# Patient Record
Sex: Female | Born: 1985 | Race: White | Hispanic: No | Marital: Married | Smoking: Never smoker
Health system: Southern US, Community
[De-identification: ages and names within clinical notes are randomized; demographics above are authoritative.]

## PROBLEM LIST (undated history)

## (undated) ENCOUNTER — Inpatient Hospital Stay: Payer: Self-pay

## (undated) DIAGNOSIS — A6009 Herpesviral infection of other urogenital tract: Secondary | ICD-10-CM

## (undated) DIAGNOSIS — E559 Vitamin D deficiency, unspecified: Secondary | ICD-10-CM

## (undated) DIAGNOSIS — O4440 Low lying placenta NOS or without hemorrhage, unspecified trimester: Secondary | ICD-10-CM

## (undated) DIAGNOSIS — F419 Anxiety disorder, unspecified: Secondary | ICD-10-CM

## (undated) DIAGNOSIS — K219 Gastro-esophageal reflux disease without esophagitis: Secondary | ICD-10-CM

## (undated) DIAGNOSIS — U071 COVID-19: Secondary | ICD-10-CM

## (undated) DIAGNOSIS — Z87442 Personal history of urinary calculi: Secondary | ICD-10-CM

## (undated) HISTORY — PX: TONSILLECTOMY: SUR1361

## (undated) HISTORY — DX: COVID-19: U07.1

## (undated) HISTORY — DX: Low lying placenta nos or without hemorrhage, unspecified trimester: O44.40

## (undated) HISTORY — DX: Anxiety disorder, unspecified: F41.9

## (undated) HISTORY — PX: OTHER SURGICAL HISTORY: SHX169

## (undated) HISTORY — PX: DIAGNOSTIC LAPAROSCOPY: SUR761

## (undated) HISTORY — DX: Herpesviral infection of other urogenital tract: A60.09

## (undated) HISTORY — DX: Vitamin D deficiency, unspecified: E55.9

## (undated) HISTORY — PX: ABDOMINAL HYSTERECTOMY: SHX81

---
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
## 2008-09-18 ENCOUNTER — Emergency Department: Payer: Self-pay | Admitting: Emergency Medicine

## 2009-09-06 ENCOUNTER — Ambulatory Visit: Payer: Self-pay | Admitting: Family Medicine

## 2009-09-09 ENCOUNTER — Ambulatory Visit: Payer: Self-pay | Admitting: Family Medicine

## 2009-11-03 ENCOUNTER — Ambulatory Visit: Payer: Self-pay | Admitting: General Practice

## 2009-11-04 ENCOUNTER — Emergency Department: Payer: Self-pay | Admitting: Emergency Medicine

## 2009-11-07 ENCOUNTER — Ambulatory Visit: Payer: Self-pay | Admitting: Urology

## 2009-12-30 ENCOUNTER — Ambulatory Visit: Payer: Self-pay | Admitting: Urology

## 2010-01-09 ENCOUNTER — Ambulatory Visit: Payer: Self-pay | Admitting: Obstetrics and Gynecology

## 2010-01-13 ENCOUNTER — Ambulatory Visit: Payer: Self-pay | Admitting: Obstetrics and Gynecology

## 2010-03-14 ENCOUNTER — Ambulatory Visit: Payer: Self-pay | Admitting: Physician Assistant

## 2010-05-07 ENCOUNTER — Emergency Department: Payer: Self-pay | Admitting: Emergency Medicine

## 2010-06-14 ENCOUNTER — Other Ambulatory Visit: Payer: Self-pay | Admitting: Physician Assistant

## 2010-08-26 ENCOUNTER — Ambulatory Visit: Payer: Self-pay | Admitting: Family Medicine

## 2011-06-01 IMAGING — CR DG KNEE COMPLETE 4+V*R*
1 series · 4 of 4 positions shown · non-contrast
Comparison: none

REASON FOR EXAM: injury pain crepitus fax result to [phone]  Knee pain
COMMENTS:

[Series 1: view not recorded · 0.17mm/px · 4 of 4 slices shown]
[im 1/4]
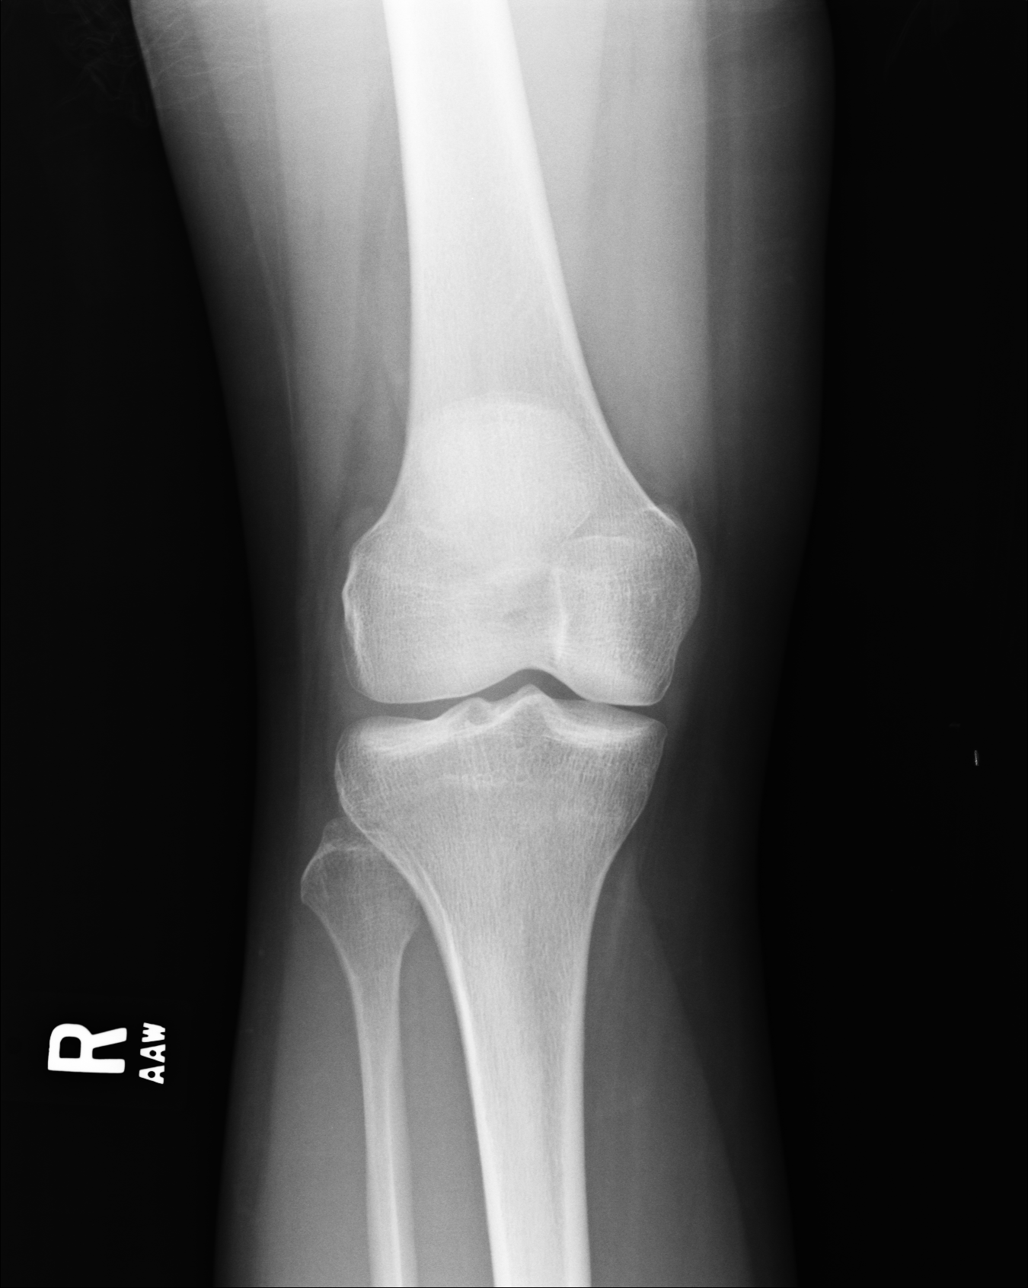
[im 2/4]
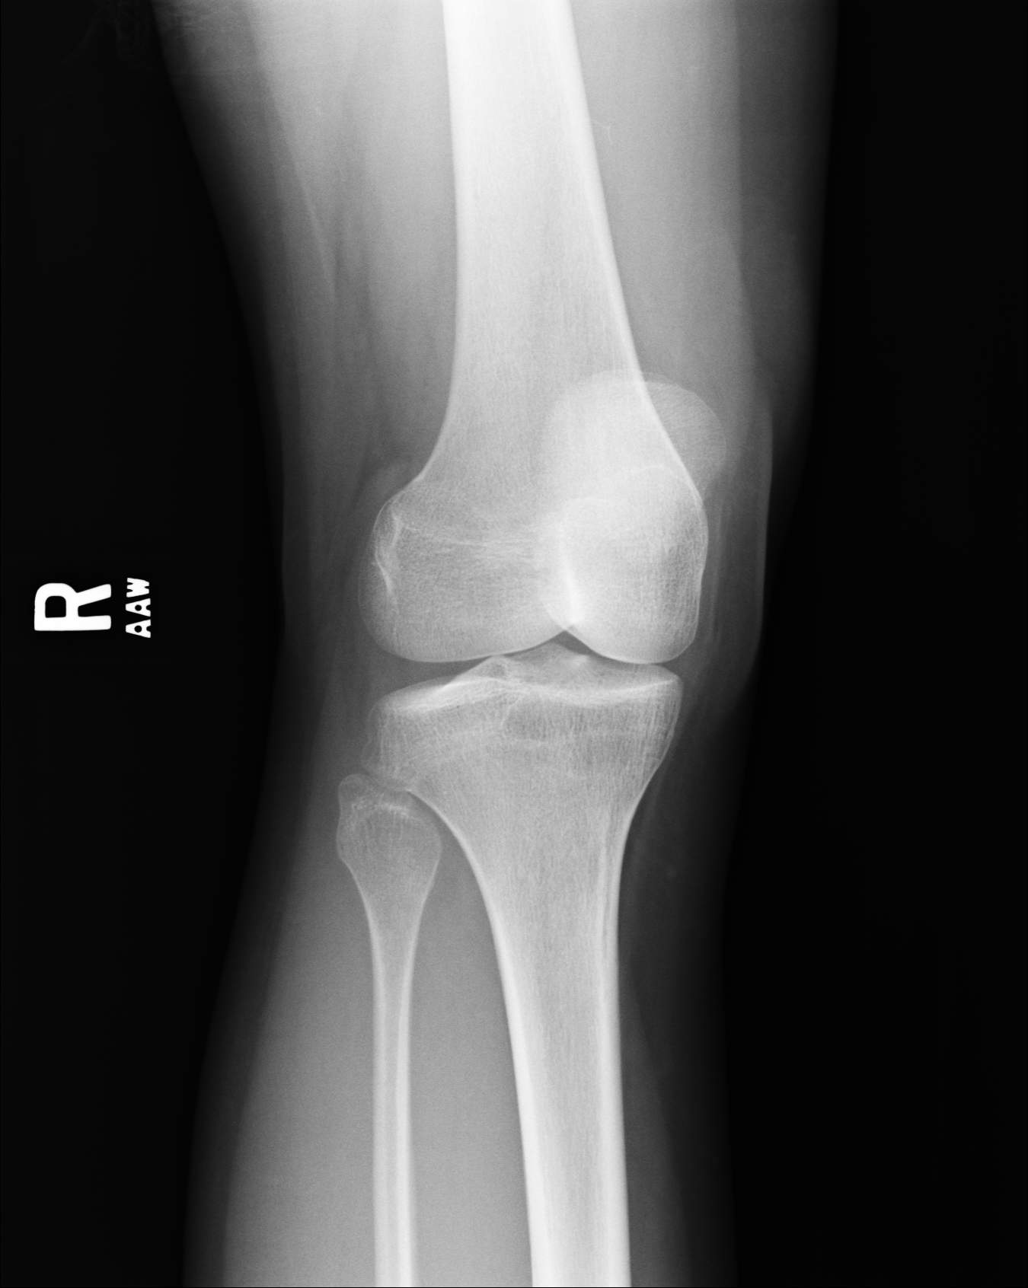
[im 3/4]
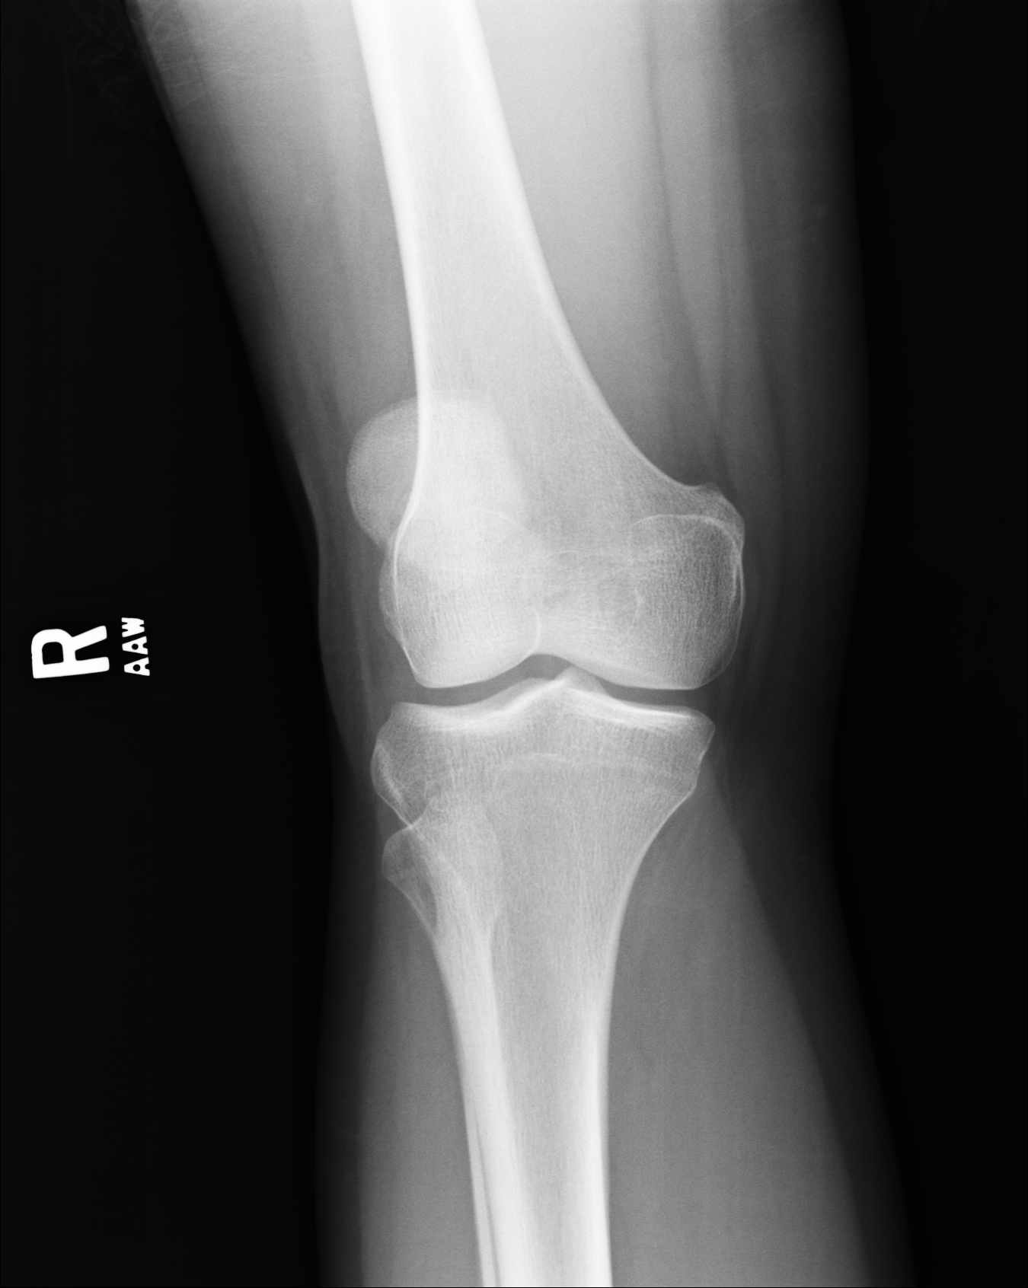
[im 4/4]
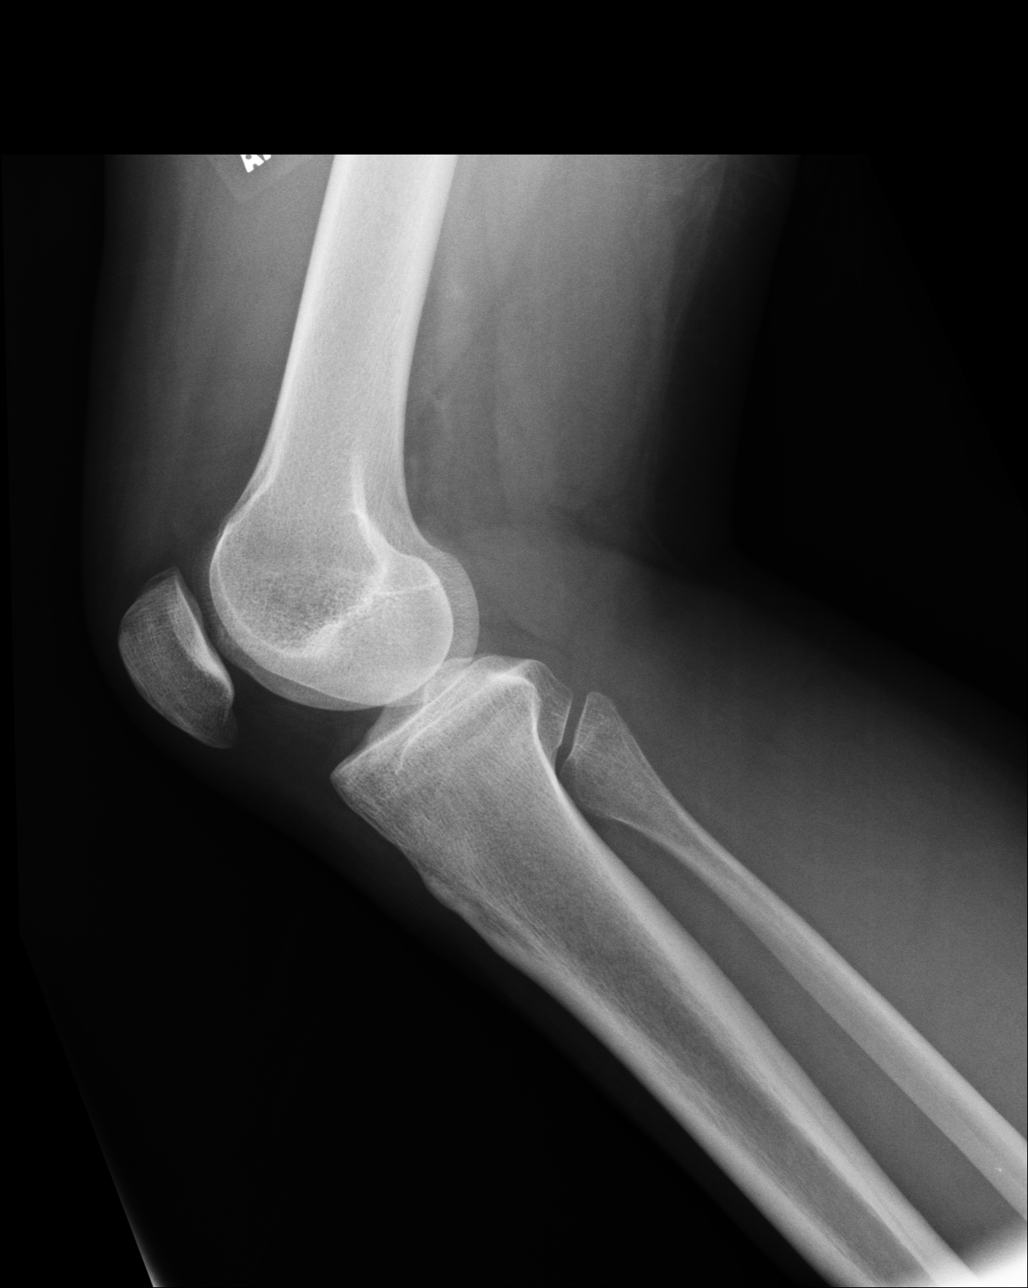

[4 of 4 positions shown; findings below may reference images not displayed]

PROCEDURE:     DXR - DXR KNEE RT COMP WITH OBLIQUES  - March 14, 2010 [DATE]

RESULT:     Four views of the right knee are submitted. The bones appear
adequately mineralized. I do not see evidence of an acute fracture nor
dislocation. No significant degenerative change is seen. I see no definite
evidence of a joint effusion.
IMPRESSION: I see no acute bony abnormality of the right knee.

## 2012-03-12 ENCOUNTER — Ambulatory Visit: Payer: Self-pay | Admitting: Otolaryngology

## 2012-08-28 ENCOUNTER — Ambulatory Visit: Payer: Self-pay | Admitting: General Practice

## 2012-12-30 ENCOUNTER — Other Ambulatory Visit: Payer: Self-pay | Admitting: Obstetrics and Gynecology

## 2013-04-18 ENCOUNTER — Observation Stay: Payer: Self-pay

## 2013-04-18 LAB — URINALYSIS, COMPLETE
Glucose,UR: NEGATIVE mg/dL (ref 0–75)
Ketone: NEGATIVE
Nitrite: NEGATIVE
Ph: 7 (ref 4.5–8.0)
Protein: NEGATIVE
RBC,UR: 1 /HPF (ref 0–5)
Specific Gravity: 1.012 (ref 1.003–1.030)
WBC UR: 22 /HPF (ref 0–5)

## 2013-04-20 LAB — URINE CULTURE

## 2013-04-23 ENCOUNTER — Observation Stay: Payer: Self-pay | Admitting: Obstetrics and Gynecology

## 2013-04-25 ENCOUNTER — Observation Stay: Payer: Self-pay | Admitting: Obstetrics and Gynecology

## 2013-04-25 LAB — URINALYSIS, COMPLETE
Ketone: NEGATIVE
Nitrite: NEGATIVE
Ph: 8 (ref 4.5–8.0)
Squamous Epithelial: 4

## 2013-04-26 LAB — URINE CULTURE

## 2013-05-01 ENCOUNTER — Observation Stay: Payer: Self-pay | Admitting: Obstetrics and Gynecology

## 2013-05-06 ENCOUNTER — Inpatient Hospital Stay: Payer: Self-pay | Admitting: Obstetrics and Gynecology

## 2013-05-06 LAB — CBC WITH DIFFERENTIAL/PLATELET
Basophil #: 0.1 10*3/uL (ref 0.0–0.1)
Basophil %: 0.4 %
Eosinophil #: 0.2 10*3/uL (ref 0.0–0.7)
HCT: 31.5 % — ABNORMAL LOW (ref 35.0–47.0)
HGB: 10.6 g/dL — ABNORMAL LOW (ref 12.0–16.0)
Lymphocyte #: 3.8 10*3/uL — ABNORMAL HIGH (ref 1.0–3.6)
Lymphocyte %: 27.3 %
MCV: 84 fL (ref 80–100)
Monocyte #: 0.7 x10 3/mm (ref 0.2–0.9)
Monocyte %: 4.7 %
Neutrophil #: 9.2 10*3/uL — ABNORMAL HIGH (ref 1.4–6.5)
Neutrophil %: 66.1 %
Platelet: 346 10*3/uL (ref 150–440)
RDW: 14.5 % (ref 11.5–14.5)
WBC: 13.9 10*3/uL — ABNORMAL HIGH (ref 3.6–11.0)

## 2013-05-08 LAB — HEMATOCRIT: HCT: 25.6 % — ABNORMAL LOW (ref 35.0–47.0)

## 2013-05-09 LAB — HEMATOCRIT: HCT: 34.7 % — ABNORMAL LOW (ref 35.0–47.0)

## 2014-02-03 ENCOUNTER — Emergency Department: Payer: Self-pay | Admitting: Emergency Medicine

## 2014-06-04 NOTE — L&D Delivery Note (Signed)
Delivery Note At  a viable and healthy female was delivered via  (Presentation:OA;  ).  APGAR:8 ,9 ; weight  .   Placenta status:delivered intact schultz , .  3 vessel Cord: with one loop over posterior shoulder  with the following complications:none .  Cord pH: NA  Anesthesia: Epidural  Episiotomy:  none Lacerations:  none Suture Repair: NA Est. Blood Loss (mL):  200  Mom to postpartum.  Baby to Couplet care / Skin to Skin. Delivered with supervision by Kadeisha Betsch Mohammed Kindle Elder, RN-C, SNM  Khandi Kernes Trudee Kuster 02/08/2015, 9:17 PM

## 2014-07-06 LAB — OB RESULTS CONSOLE PLATELET COUNT: PLATELETS: 364 10*3/uL

## 2014-07-06 LAB — OB RESULTS CONSOLE RUBELLA ANTIBODY, IGM: Rubella: IMMUNE

## 2014-07-06 LAB — OB RESULTS CONSOLE GC/CHLAMYDIA
CHLAMYDIA, DNA PROBE: NEGATIVE
Gonorrhea: NEGATIVE

## 2014-07-06 LAB — OB RESULTS CONSOLE ANTIBODY SCREEN: ANTIBODY SCREEN: NEGATIVE

## 2014-07-06 LAB — OB RESULTS CONSOLE VARICELLA ZOSTER ANTIBODY, IGG: Varicella: IMMUNE

## 2014-07-06 LAB — OB RESULTS CONSOLE HIV ANTIBODY (ROUTINE TESTING): HIV: NONREACTIVE

## 2014-07-06 LAB — OB RESULTS CONSOLE HGB/HCT, BLOOD
HCT: 39 %
Hemoglobin: 13.4 g/dL

## 2014-07-06 LAB — OB RESULTS CONSOLE HEPATITIS B SURFACE ANTIGEN: HEP B S AG: NEGATIVE

## 2014-07-06 LAB — OB RESULTS CONSOLE ABO/RH: RH Type: POSITIVE

## 2014-07-06 LAB — OB RESULTS CONSOLE RPR: RPR: NONREACTIVE

## 2014-10-12 NOTE — H&P (Signed)
L&D Evaluation:  History:  HPI 16 yowf G1P0 at 33.5 weeks , estimated date of confinement 06/09/13, presents for A/P testing due to 3rd trimester bleeding and abnormal U/S demonstrating placental lakes with one lake suspicious for active blood flow. Rt flank/abdominal pain persists. no UTI symptoms; recently stopped Macrobid for prophylaxis for recurrent UTI in pregnancy. No vaginal discharge. Some light spotting.   Patient's Medical History Recurrent UTI; BV   Patient's Surgical History Laparoscopy   Medications Pre Natal Vitamins  Iron   Allergies NKDA   Social History none   Family History Non-Contributory   ROS:  ROS See HPI   Exam:  Vital Signs stable   Urine Protein negative dipstick, 3+ leucocyte esterase; NO Blood   General no apparent distress, anxious   Abdomen gravid, non-tender, No CVAT   Estimated Fetal Weight Average for gestational age   Back no CVAT   Edema no edema   Pelvic no external lesions, 50%$/closed/No blood/VTX/BOWI   FHT normal rate with no decels, NST reactive   Ucx absent, Irritability   Skin dry, no lesions, Petechial rash on abdomen   Lymph no lymphadenopathy   Impression:  Impression reactive NST, 33.5 week Intrauterine pregnancy; Abnormal Placenta on U/S (placental lakes); Suspected UTI   Plan:  Plan Continue Q 3 Day NST; PML/Abruptio precautions;  BID Fetal Kick Counts;  Discontinue strict bed rest, continue sexual abstinence, no shopping/extended ambulation/prolonged standingl; Urine culture   Comments Pt is S/P betamethasone x 2.   Follow Up Appointment already scheduled   Electronic Signatures: Lue Sykora, Alanda Slim (MD)  (Signed [phone] 10:46)  Authored: L&D Evaluation   Last Updated: 22-Nov-14 10:46 by Almedia Cordell, Alanda Slim (MD)

## 2014-10-12 NOTE — H&P (Signed)
L&D Evaluation:  History:  HPI 29yo MWF presented at 5am with SPROM at 230am of clear fluid with spotting.[redacted]w[redacted]d EGA [id number]; pregancy complicated by anemia and uterine bleeding secondary to placental lakes.   Presents with contractions, leaking fluid   Patient's Medical History No Chronic Illness   Patient's Surgical History other  Exploratory lap for endometriosis   Medications Pre Natal Vitamins  Iron  Tylenol (Acetaminophen)  benedryl, prilosec, vit D   Allergies NKDA   Social History none   Family History Non-Contributory   ROS:  ROS All systems were reviewed.  HEENT, CNS, GI, GU, Respiratory, CV, Renal and Musculoskeletal systems were found to be normal.   Exam:  Vital Signs stable   Urine Protein negative dipstick   General no apparent distress   Mental Status clear   Chest clear   Heart normal sinus rhythm   Abdomen gravid, tender with contractions   Estimated Fetal Weight Average for gestational age   Fetal Position vtx   Back no CVAT   Edema 1+   Clonus negative   Pelvic 1/80/-1   Mebranes Ruptured   Description clear, scant blood show   FHT normal rate with no decels   Fetal Heart Rate 135   Ucx regular   Ucx Frequency 2 min   Length of each Contraction 60 seconds   Ucx Pain Scale 4   Impression:  Impression early labor, SPROM   Plan:  Plan EFM/NST, monitor contractions and for cervical change, antibiotics for GBBS prophylaxis, fluids, Foley bulb placed and pitocin augmentation   Electronic Signatures: Trudee Kuster, Melody N (CNM)  (Signed 03-Dec-14 08:53)  Authored: L&D Evaluation   Last Updated: 03-Dec-14 08:53 by Evonnie Pat (CNM)

## 2014-11-22 ENCOUNTER — Encounter: Payer: Self-pay | Admitting: *Deleted

## 2014-11-25 ENCOUNTER — Ambulatory Visit (INDEPENDENT_AMBULATORY_CARE_PROVIDER_SITE_OTHER): Payer: 59 | Admitting: Obstetrics and Gynecology

## 2014-11-25 ENCOUNTER — Encounter: Payer: Self-pay | Admitting: Obstetrics and Gynecology

## 2014-11-25 VITALS — BP 103/72 | HR 96 | Wt 169.8 lb

## 2014-11-25 DIAGNOSIS — O9989 Other specified diseases and conditions complicating pregnancy, childbirth and the puerperium: Secondary | ICD-10-CM

## 2014-11-25 DIAGNOSIS — R300 Dysuria: Secondary | ICD-10-CM

## 2014-11-25 DIAGNOSIS — N390 Urinary tract infection, site not specified: Secondary | ICD-10-CM

## 2014-11-25 DIAGNOSIS — M549 Dorsalgia, unspecified: Secondary | ICD-10-CM

## 2014-11-25 DIAGNOSIS — Z3493 Encounter for supervision of normal pregnancy, unspecified, third trimester: Secondary | ICD-10-CM

## 2014-11-25 LAB — POCT URINALYSIS DIPSTICK
Blood, UA: NEGATIVE
GLUCOSE UA: NEGATIVE
Ketones, UA: NEGATIVE
Leukocytes, UA: NEGATIVE
NITRITE UA: NEGATIVE
PROTEIN UA: NEGATIVE
UROBILINOGEN UA: 0.2
pH, UA: 6.5

## 2014-11-25 NOTE — Progress Notes (Signed)
ROB- reports onset mid-back pain x 1 week worse after being up on feet and active- recommend maternity belt and massage; reports irregular spotting with increased activity (not new)- reiterated need to let me know if it is heavy or persistant;  Glucola next visit

## 2014-11-25 NOTE — Progress Notes (Signed)
Pt is c/o bad back pain x1 week

## 2014-11-25 NOTE — Patient Instructions (Signed)
  Place 24-38 weeks prenatal visit patient instructions here.

## 2014-11-26 LAB — URINE CULTURE

## 2014-11-30 ENCOUNTER — Observation Stay
Admission: EM | Admit: 2014-11-30 | Discharge: 2014-11-30 | Disposition: A | Discharge status: Home / Self Care | Payer: 59 | Attending: Obstetrics and Gynecology | Admitting: Obstetrics and Gynecology

## 2014-11-30 DIAGNOSIS — O26892 Other specified pregnancy related conditions, second trimester: Secondary | ICD-10-CM | POA: Diagnosis not present

## 2014-11-30 DIAGNOSIS — R110 Nausea: Secondary | ICD-10-CM | POA: Diagnosis present

## 2014-11-30 DIAGNOSIS — R109 Unspecified abdominal pain: Secondary | ICD-10-CM | POA: Insufficient documentation

## 2014-11-30 DIAGNOSIS — N9489 Other specified conditions associated with female genital organs and menstrual cycle: Secondary | ICD-10-CM | POA: Diagnosis present

## 2014-11-30 DIAGNOSIS — Z3A27 27 weeks gestation of pregnancy: Secondary | ICD-10-CM | POA: Insufficient documentation

## 2014-11-30 LAB — URINALYSIS COMPLETE WITH MICROSCOPIC (ARMC ONLY)
Bilirubin Urine: NEGATIVE
GLUCOSE, UA: NEGATIVE mg/dL
Hgb urine dipstick: NEGATIVE
Ketones, ur: NEGATIVE mg/dL
NITRITE: NEGATIVE
Protein, ur: NEGATIVE mg/dL
Specific Gravity, Urine: 1.005 (ref 1.005–1.030)
pH: 7 (ref 5.0–8.0)

## 2014-11-30 MED ORDER — ACETAMINOPHEN 325 MG PO TABS: 650.0000 mg | ORAL_TABLET | ORAL | Status: DC | PRN

## 2014-11-30 MED ORDER — LACTATED RINGERS IV SOLN
INTRAVENOUS | Status: DC
  Administered 2014-11-30 (×2): via INTRAVENOUS

## 2014-11-30 MED ORDER — FENTANYL CITRATE (PF) 100 MCG/2ML IJ SOLN
50.0000 ug | Freq: Once | INTRAMUSCULAR | Status: AC
  Administered 2014-11-30: 50 ug via INTRAVENOUS

## 2014-11-30 MED ORDER — FENTANYL CITRATE (PF) 100 MCG/2ML IJ SOLN
INTRAMUSCULAR | Status: AC
  Administered 2014-11-30: 50 ug via INTRAVENOUS
  Filled 2014-11-30: qty 2

## 2014-11-30 MED ORDER — ONDANSETRON HCL 4 MG/2ML IJ SOLN
4.0000 mg | Freq: Four times a day (QID) | INTRAMUSCULAR | Status: DC | PRN
  Administered 2014-11-30: 4 mg via INTRAVENOUS

## 2014-11-30 NOTE — Progress Notes (Signed)
Rebecca Evans is a 29 y.o. G2P1001 at [redacted]w[redacted]d who is admitted for cramping and nausea x 2days with severe cramping since 10am today..  Estimated Date of Delivery: 02/26/15 Fetal presentation is unsure.  Length of Stay:   Days. Admitted 11/30/2014  Subjective: Onset of nausea x 2 days without vomiting, anorexia and not eating much since onset.  Patient reports good fetal movement.  She reports irregular crampy like  uterine contractions, no bleeding and no loss of fluid per vagina.  Vitals:  Blood pressure 113/74, pulse 109, temperature 98.6 F (37 C), temperature source Oral, resp. rate 20, height 5\' 6"  (1.676 m), weight 76.658 kg (169 lb), last menstrual period 05/22/2014. Physical Examination: CONSTITUTIONAL: Well-developed, well-nourished female in no acute distress.  SKIN: Skin is warm and dry. No rash noted. Not diaphoretic. No erythema. No pallor. [place]: Alert and oriented to person, place, and time. Normal reflexes, muscle tone coordination. No cranial nerve deficit noted. PSYCHIATRIC: Normal mood and affect. Normal behavior. Normal judgment and thought content. CARDIOVASCULAR: Normal heart rate noted, regular rhythm RESPIRATORY: Effort and breath sounds normal, no problems with respiration noted MUSCULOSKELETAL: Normal range of motion. No edema and no tenderness. 2+ distal pulses. ABDOMEN: Soft, nontender, nondistended, gravid. CERVIX: Dilation: Closed Effacement (%): Thick Cervical Position: Middle Station: Ballotable Presentation: Undeterminable Exam by:: k. yates, rn  Fetal monitoring: FHR: 156 bpm, Variability: moderate, Accelerations: Present, Decelerations: Absent  Uterine activity: 8 contractions per hour, lasting 20-30 seconds mild to palpation on admission- resolved with IVF hydration  Results for orders placed or performed during the hospital encounter of 11/30/14 (from the past 48 hour(s))  Urinalysis complete, with microscopic ([facility] only)     Status:  Abnormal   Collection Time: 11/30/14  1:02 PM  Result Value Ref Range   Color, Urine YELLOW (A) YELLOW   APPearance CLEAR (A) CLEAR   Glucose, UA NEGATIVE NEGATIVE mg/dL   Bilirubin Urine NEGATIVE NEGATIVE   Ketones, ur NEGATIVE NEGATIVE mg/dL   Specific Gravity, Urine 1.005 1.005 - 1.030   Hgb urine dipstick NEGATIVE NEGATIVE   pH 7.0 5.0 - 8.0   Protein, ur NEGATIVE NEGATIVE mg/dL   Nitrite NEGATIVE NEGATIVE   Leukocytes, UA 2+ (A) NEGATIVE   RBC / HPF 0-5 0 - 5 RBC/hpf   WBC, UA 0-5 0 - 5 WBC/hpf   Bacteria, UA RARE (A) NONE SEEN   Squamous Epithelial / LPF 0-5 (A) NONE SEEN    No results found.  Current scheduled medications    I have reviewed the patient's current medications.  ASSESSMENT: Patient Active Problem List   Diagnosis Date Noted  . Labor and delivery, indication for care 11/30/2014  Uterine irritability at [redacted]w[redacted]d Nausea   PLAN: Continue IV hydration until 2 liters infused, trial PO liduids and crackers D/c home when stable rx for zofran 4mg  ODT called in to pharmacy Continue routine antenatal   Rebecca Evans, CNM [facility] [place]

## 2014-11-30 NOTE — OB Triage Note (Signed)
Pt c/o lower abdominal pain, "dribbling urine", cramping and headache which all started this AM. Denies bleeding. Reports nausea for past 2 days, no vomiting.

## 2014-11-30 NOTE — Progress Notes (Signed)
Pt states after her d/c instructions reviewed that she was to receive a work note excuse for today and tomorrow from [employer]. CNM contacted and stated that was correct but that she had forgotten too- requested that RN complete a note for pt. Done and provided to pt.

## 2014-11-30 NOTE — Progress Notes (Signed)
Report received from off going shift that pt ready for d/c now that second litre fluid infused. Pt states that she is feeling better and ready too.Marland Kitchen Spouse and daughter at bedside. Spouse attentive.

## 2014-12-07 ENCOUNTER — Ambulatory Visit (INDEPENDENT_AMBULATORY_CARE_PROVIDER_SITE_OTHER): Payer: 59 | Admitting: Obstetrics and Gynecology

## 2014-12-07 ENCOUNTER — Encounter: Payer: Self-pay | Admitting: Obstetrics and Gynecology

## 2014-12-07 VITALS — BP 112/70 | HR 97 | Wt 171.0 lb

## 2014-12-07 DIAGNOSIS — Z3493 Encounter for supervision of normal pregnancy, unspecified, third trimester: Secondary | ICD-10-CM

## 2014-12-07 DIAGNOSIS — Z131 Encounter for screening for diabetes mellitus: Secondary | ICD-10-CM

## 2014-12-07 LAB — POCT URINALYSIS DIPSTICK
GLUCOSE UA: NEGATIVE
Ketones, UA: 15
Nitrite, UA: NEGATIVE
RBC UA: NEGATIVE
Spec Grav, UA: 1.01
UROBILINOGEN UA: 0.2
pH, UA: 6.5

## 2014-12-07 NOTE — Progress Notes (Signed)
Pt denies any changes, surprised at her weight gain

## 2014-12-07 NOTE — Patient Instructions (Signed)
  Place 24-38 weeks prenatal visit patient instructions here.

## 2014-12-07 NOTE — Progress Notes (Signed)
ROB & glucola- feeling better since hospital visit last week, Tdap given and Blood consent signed;

## 2014-12-08 ENCOUNTER — Telehealth: Payer: Self-pay | Admitting: *Deleted

## 2014-12-08 LAB — URINE CULTURE

## 2014-12-08 LAB — GLUCOSE, 1 HOUR GESTATIONAL: GESTATIONAL DIABETES SCREEN: 121 mg/dL (ref 65–139)

## 2014-12-08 LAB — HEMOGLOBIN AND HEMATOCRIT, BLOOD
HEMATOCRIT: 35.1 % (ref 34.0–46.6)
HEMOGLOBIN: 11.9 g/dL (ref 11.1–15.9)

## 2014-12-08 NOTE — Telephone Encounter (Signed)
Notified pt of results, she voiced understanding.

## 2014-12-08 NOTE — Telephone Encounter (Signed)
-----   Message from Evonnie Pat, [place] sent at 12/08/2014  8:12 AM EDT ----- Please let her kow she passed her glucola and no signs of anemia

## 2014-12-09 ENCOUNTER — Encounter: Payer: 59 | Admitting: Obstetrics and Gynecology

## 2014-12-21 ENCOUNTER — Ambulatory Visit (INDEPENDENT_AMBULATORY_CARE_PROVIDER_SITE_OTHER): Payer: 59 | Admitting: Obstetrics and Gynecology

## 2014-12-21 ENCOUNTER — Encounter: Payer: Self-pay | Admitting: Obstetrics and Gynecology

## 2014-12-21 VITALS — BP 117/79 | HR 104 | Wt 172.7 lb

## 2014-12-21 DIAGNOSIS — Z3493 Encounter for supervision of normal pregnancy, unspecified, third trimester: Secondary | ICD-10-CM

## 2014-12-21 LAB — POCT URINALYSIS DIPSTICK
BILIRUBIN UA: NEGATIVE
GLUCOSE UA: NEGATIVE
Ketones, UA: NEGATIVE
NITRITE UA: NEGATIVE
RBC UA: NEGATIVE
Spec Grav, UA: 1.015
UROBILINOGEN UA: 0.2
pH, UA: 6.5

## 2014-12-21 NOTE — Progress Notes (Signed)
Pt is doing well, no complaints, is feeling good today

## 2014-12-21 NOTE — Progress Notes (Signed)
ROB- doing well, denies any spotting or UTI s/s.

## 2014-12-23 LAB — URINE CULTURE: Organism ID, Bacteria: NO GROWTH

## 2014-12-29 ENCOUNTER — Encounter: Payer: Self-pay | Admitting: Obstetrics and Gynecology

## 2014-12-29 ENCOUNTER — Ambulatory Visit (INDEPENDENT_AMBULATORY_CARE_PROVIDER_SITE_OTHER): Payer: 59 | Admitting: Obstetrics and Gynecology

## 2014-12-29 VITALS — BP 108/72 | HR 99 | Wt 172.6 lb

## 2014-12-29 DIAGNOSIS — Z3493 Encounter for supervision of normal pregnancy, unspecified, third trimester: Secondary | ICD-10-CM

## 2014-12-29 LAB — POCT URINALYSIS DIPSTICK
Bilirubin, UA: NEGATIVE
Blood, UA: NEGATIVE
GLUCOSE UA: NEGATIVE
Ketones, UA: 5
NITRITE UA: NEGATIVE
PH UA: 6.5
Protein, UA: NEGATIVE
SPEC GRAV UA: 1.01
Urobilinogen, UA: 0.2

## 2014-12-29 NOTE — Progress Notes (Signed)
Pt is here for OB work in, c/o pressure in vaginal area, she states "she just doesn't feel good", is concerned b/c her urine color is generally a dark yellow and today its pale yellow

## 2014-12-30 LAB — URINE CULTURE: ORGANISM ID, BACTERIA: NO GROWTH

## 2015-01-04 ENCOUNTER — Encounter: Payer: Self-pay | Admitting: Obstetrics and Gynecology

## 2015-01-04 ENCOUNTER — Other Ambulatory Visit: Payer: Self-pay | Admitting: Obstetrics and Gynecology

## 2015-01-04 ENCOUNTER — Ambulatory Visit (INDEPENDENT_AMBULATORY_CARE_PROVIDER_SITE_OTHER): Payer: 59 | Admitting: Obstetrics and Gynecology

## 2015-01-04 ENCOUNTER — Other Ambulatory Visit: Payer: Self-pay

## 2015-01-04 VITALS — BP 116/77 | HR 103 | Wt 174.1 lb

## 2015-01-04 DIAGNOSIS — R102 Pelvic and perineal pain: Principal | ICD-10-CM

## 2015-01-04 DIAGNOSIS — O26893 Other specified pregnancy related conditions, third trimester: Secondary | ICD-10-CM

## 2015-01-04 DIAGNOSIS — Z3493 Encounter for supervision of normal pregnancy, unspecified, third trimester: Secondary | ICD-10-CM

## 2015-01-04 LAB — POCT URINALYSIS DIPSTICK
Blood, UA: NEGATIVE
Glucose, UA: NEGATIVE
Ketones, UA: NEGATIVE
Nitrite, UA: NEGATIVE
Spec Grav, UA: 1.01
Urobilinogen, UA: 0.2
pH, UA: 6.5

## 2015-01-04 NOTE — Progress Notes (Signed)
ROB- reassured patient of no cervical changes; FFN obtained; reiterated PTL s/s; maternity belt made pressure worse. Microscopic wet-mount exam shows negative for pathogens, normal epithelial cells.

## 2015-01-04 NOTE — Progress Notes (Signed)
Pt is still having some cramps, still feels as if something isnt right, lots of vaginal pressure

## 2015-01-05 ENCOUNTER — Encounter: Payer: Self-pay | Admitting: Obstetrics and Gynecology

## 2015-01-05 ENCOUNTER — Telehealth: Payer: Self-pay | Admitting: *Deleted

## 2015-01-05 LAB — FETAL FIBRONECTIN: Fetal Fibronectin: NEGATIVE

## 2015-01-05 NOTE — Telephone Encounter (Signed)
Notified pt of normal results 

## 2015-01-05 NOTE — Telephone Encounter (Signed)
-----   Message from Evonnie Pat, [place] sent at 01/05/2015 10:07 AM EDT ----- Please let her know FFN is negative

## 2015-01-18 ENCOUNTER — Ambulatory Visit (INDEPENDENT_AMBULATORY_CARE_PROVIDER_SITE_OTHER): Payer: 59 | Admitting: Obstetrics and Gynecology

## 2015-01-18 VITALS — BP 114/69 | HR 97 | Wt 175.0 lb

## 2015-01-18 DIAGNOSIS — Z36 Encounter for antenatal screening of mother: Secondary | ICD-10-CM

## 2015-01-18 DIAGNOSIS — Z331 Pregnant state, incidental: Secondary | ICD-10-CM

## 2015-01-18 DIAGNOSIS — Z1389 Encounter for screening for other disorder: Secondary | ICD-10-CM

## 2015-01-18 DIAGNOSIS — Z369 Encounter for antenatal screening, unspecified: Secondary | ICD-10-CM

## 2015-01-18 DIAGNOSIS — N390 Urinary tract infection, site not specified: Secondary | ICD-10-CM

## 2015-01-18 LAB — POCT URINALYSIS DIPSTICK
Bilirubin, UA: NEGATIVE
Glucose, UA: NEGATIVE
Ketones, UA: NEGATIVE
NITRITE UA: NEGATIVE
PH UA: 6
PROTEIN UA: NEGATIVE
RBC UA: NEGATIVE
Spec Grav, UA: 1.01
UROBILINOGEN UA: NEGATIVE

## 2015-01-18 NOTE — Addendum Note (Signed)
Addended by: Earl Lagos on: 01/18/2015 12:23 PM   Modules accepted: Orders

## 2015-01-18 NOTE — Progress Notes (Signed)
Rob- reports increased [facility] that are painful but not consistent; PTL precautions discussed; vaginal varicosities noted- to use TUCKs pads prn; states didn't get Tdap at 28 weeks visit- and declines at this time.  Cultures next visit. Considering nexplanon PP.

## 2015-01-18 NOTE — Progress Notes (Signed)
Pt states FM has decreased, bloody streaks in vaginal discharge, and cramping vaginally to back then around to front.

## 2015-01-19 ENCOUNTER — Telehealth: Payer: Self-pay | Admitting: *Deleted

## 2015-01-19 LAB — URINE CULTURE

## 2015-01-21 NOTE — Telephone Encounter (Signed)
Notified pt results

## 2015-01-21 NOTE — Telephone Encounter (Signed)
Notified pt. 

## 2015-01-27 ENCOUNTER — Ambulatory Visit (INDEPENDENT_AMBULATORY_CARE_PROVIDER_SITE_OTHER): Payer: 59 | Admitting: Obstetrics and Gynecology

## 2015-01-27 ENCOUNTER — Encounter: Payer: Self-pay | Admitting: Obstetrics and Gynecology

## 2015-01-27 VITALS — BP 113/74 | HR 97 | Wt 176.9 lb

## 2015-01-27 DIAGNOSIS — Z113 Encounter for screening for infections with a predominantly sexual mode of transmission: Secondary | ICD-10-CM

## 2015-01-27 DIAGNOSIS — Z36 Encounter for antenatal screening of mother: Secondary | ICD-10-CM

## 2015-01-27 DIAGNOSIS — Z3685 Encounter for antenatal screening for Streptococcus B: Secondary | ICD-10-CM

## 2015-01-27 DIAGNOSIS — Z331 Pregnant state, incidental: Secondary | ICD-10-CM

## 2015-01-27 LAB — POCT URINALYSIS DIPSTICK
BILIRUBIN UA: NEGATIVE
GLUCOSE UA: NEGATIVE
Ketones, UA: NEGATIVE
Leukocytes, UA: NEGATIVE
Nitrite, UA: NEGATIVE
RBC UA: NEGATIVE
Spec Grav, UA: <=1.005
Urobilinogen, UA: 0.2
pH, UA: 7.5

## 2015-01-27 NOTE — Progress Notes (Signed)
ROB-tingling L side of her hand, contractions, low back pain, pelvic pressure cultrues today

## 2015-01-27 NOTE — Progress Notes (Signed)
ROB- cultures obtained; labor precautions reiterated.

## 2015-01-29 LAB — STREP GP B NAA: Strep Gp B NAA: POSITIVE — AB

## 2015-01-31 LAB — GC/CHLAMYDIA PROBE AMP

## 2015-02-01 ENCOUNTER — Encounter: Payer: Self-pay | Admitting: Obstetrics and Gynecology

## 2015-02-01 ENCOUNTER — Ambulatory Visit (INDEPENDENT_AMBULATORY_CARE_PROVIDER_SITE_OTHER): Payer: 59 | Admitting: Obstetrics and Gynecology

## 2015-02-01 VITALS — BP 119/67 | HR 90 | Wt 175.2 lb

## 2015-02-01 DIAGNOSIS — Z331 Pregnant state, incidental: Secondary | ICD-10-CM

## 2015-02-01 LAB — POCT URINALYSIS DIPSTICK
Bilirubin, UA: NEGATIVE
Blood, UA: NEGATIVE
Glucose, UA: NEGATIVE
Ketones, UA: NEGATIVE
Nitrite, UA: NEGATIVE
PH UA: 6.5
UROBILINOGEN UA: 0.2

## 2015-02-01 NOTE — Progress Notes (Signed)
ROB- reviewed +GBS; labor precautions reiterated

## 2015-02-01 NOTE — Patient Instructions (Signed)
Group B Streptococcus Infection During Pregnancy Group B streptococcus (GBS) is a type of bacteria often found in healthy women. GBS is not the same as the bacteria that causes strep throat. You may have GBS in your vagina, rectum, or bladder. GBS does not spread through sexual contact, but it can be passed to a baby during childbirth. This can be dangerous for your baby. It is not dangerous to you and usually does not cause any symptoms. Your health care provider may test you for GBS when your pregnancy is between 35 and 37 weeks. GBS is dangerous only during birth, so there is no need to test for it earlier. It is possible to have GBS during pregnancy and never pass it to your baby. If your test results are positive for GBS, your health care provider may recommend giving you antibiotic medicine during delivery to make sure your baby stays healthy. RISK FACTORS You are more likely to pass GBS to your baby if:   Your water breaks (ruptured membrane) or you go into labor before 37 weeks.  Your water breaks 18 hours before you deliver.  You passed GBS during a previous pregnancy.  You have a urinary tract infection caused by GBS any time during pregnancy.  You have a fever during labor. SYMPTOMS Most women who have GBS do not have any symptoms. If you have a urinary tract infection caused by GBS, you might have frequent or painful urination and fever. Babies who get GBS usually show symptoms within 7 days of birth. Symptoms may include:   Breathing problems.  Heart and blood pressure problems.  Digestive and kidney problems. DIAGNOSIS Routine screening for GBS is recommended for all pregnant women. A health care provider takes a sample of the fluid in your vagina and rectum with a swab. It is then sent to a lab to be checked for GBS. A sample of your urine may also be checked for the bacteria.  TREATMENT If you test positive for GBS, you may need treatment with an antibiotic medicine during  labor. As soon as you go into labor, or as soon as your membranes rupture, you will get the antibiotic medicine through an IV access. You will continue to get the medicine until after you give birth. You do not need antibiotic medicine if you are having a cesarean delivery.If your baby shows signs or symptoms of GBS after birth, your baby can also be treated with an antibiotic medicine. HOME CARE INSTRUCTIONS   Take all antibiotic medicine as prescribed by your health care provider. Only take medicine as directed.   Continue with prenatal visits and care.   Keep all follow-up appointments.  SEEK MEDICAL CARE IF:   You have pain when you urinate.   You have to urinate frequently.   You have a fever.  SEEK IMMEDIATE MEDICAL CARE IF:   Your membranes rupture.  You go into labor. Document Released: 08/28/2007 Document Revised: 05/26/2013 Document Reviewed: 03/13/2013 ExitCare Patient Information 2015 [employer]. This information is not intended to replace advice given to you by your health care provider. Make sure you discuss any questions you have with your health care provider.  

## 2015-02-01 NOTE — Progress Notes (Signed)
ROB-pt is having some increased vaginal fluid, lots of pelvic pressure

## 2015-02-02 ENCOUNTER — Other Ambulatory Visit: Payer: Self-pay | Admitting: *Deleted

## 2015-02-02 ENCOUNTER — Telehealth: Payer: Self-pay | Admitting: Obstetrics and Gynecology

## 2015-02-02 ENCOUNTER — Telehealth: Payer: Self-pay | Admitting: *Deleted

## 2015-02-02 DIAGNOSIS — Z113 Encounter for screening for infections with a predominantly sexual mode of transmission: Secondary | ICD-10-CM

## 2015-02-02 NOTE — Telephone Encounter (Signed)
Notified pt. 

## 2015-02-02 NOTE — Telephone Encounter (Signed)
If contractions get stronger to go to hospital for labor check, otherwise sounds normal. Continue to drink a lot of water.

## 2015-02-02 NOTE — Telephone Encounter (Signed)
nm

## 2015-02-02 NOTE — Telephone Encounter (Signed)
Pt called stating she has been spotting since her OV 02/01/2015, states its not heavy however has a lot of mucous present Per pt she is having contractions, however not constant, she is having feeling of needing to have a BM and nothing happens She is in pain and wanted to know what to do???

## 2015-02-03 ENCOUNTER — Observation Stay
Admission: EM | Admit: 2015-02-03 | Discharge: 2015-02-04 | Disposition: A | Discharge status: Home / Self Care | Payer: 59 | Attending: Obstetrics and Gynecology | Admitting: Obstetrics and Gynecology

## 2015-02-03 DIAGNOSIS — O26893 Other specified pregnancy related conditions, third trimester: Principal | ICD-10-CM | POA: Insufficient documentation

## 2015-02-04 ENCOUNTER — Other Ambulatory Visit: Payer: Self-pay | Admitting: Obstetrics and Gynecology

## 2015-02-04 DIAGNOSIS — O26893 Other specified pregnancy related conditions, third trimester: Secondary | ICD-10-CM | POA: Diagnosis present

## 2015-02-04 MED ORDER — BUTORPHANOL TARTRATE 1 MG/ML IJ SOLN
INTRAMUSCULAR | Status: AC
  Administered 2015-02-04: 2 mg via INTRAMUSCULAR
  Filled 2015-02-04: qty 2

## 2015-02-04 MED ORDER — BUTORPHANOL TARTRATE 1 MG/ML IJ SOLN
2.0000 mg | Freq: Once | INTRAMUSCULAR | Status: AC
  Administered 2015-02-04: 2 mg via INTRAMUSCULAR

## 2015-02-04 NOTE — Discharge Instructions (Signed)
LABOR: When contractions begin, you should start to time them from the beginning of one contraction to the beginning of the next.  When contractions are 5-10 minutes apart or less and have been regular for at least an hour, you should call your health care provider.  Notify your doctor if any of the following occur: 1. Bleeding from the vagina 7. Sudden, constant, or occasional abdominal pain  2. Pain or burning when urinating 8. Sudden gushing of fluid from the vagina (with or without continued leaking)  3. Chills or fever 9. Fainting spells, "black outs" or loss of consciousness  4. Increase in vaginal discharge 10. Severe or continued nausea or vomiting  5. Pelvic pressure (sudden increase) 11. Blurring of vision or spots before the eyes  6. Baby moving less than usual 12. Leaking of fluid    FETAL KICK COUNT: Lie on your left side for one hour after a meal, and count the number of times your baby kicks. If it is less than 5 times, get up, move around and drink some juice. Repeat the test 30 minutes later. If it is still less than 5 kicks in an hour, notify your doctor.

## 2015-02-07 ENCOUNTER — Inpatient Hospital Stay
Admission: EM | Admit: 2015-02-07 | Discharge: 2015-02-10 | DRG: 774 | Disposition: A | Discharge status: Home / Self Care | Payer: 59 | Attending: Obstetrics and Gynecology | Admitting: Obstetrics and Gynecology

## 2015-02-07 DIAGNOSIS — O479 False labor, unspecified: Secondary | ICD-10-CM | POA: Diagnosis present

## 2015-02-07 DIAGNOSIS — F419 Anxiety disorder, unspecified: Secondary | ICD-10-CM | POA: Diagnosis present

## 2015-02-07 DIAGNOSIS — O99344 Other mental disorders complicating childbirth: Secondary | ICD-10-CM | POA: Diagnosis present

## 2015-02-07 DIAGNOSIS — O4410 Placenta previa with hemorrhage, unspecified trimester: Secondary | ICD-10-CM | POA: Diagnosis present

## 2015-02-07 DIAGNOSIS — Z3A37 37 weeks gestation of pregnancy: Secondary | ICD-10-CM | POA: Diagnosis present

## 2015-02-07 DIAGNOSIS — A6000 Herpesviral infection of urogenital system, unspecified: Secondary | ICD-10-CM | POA: Diagnosis present

## 2015-02-07 DIAGNOSIS — Z3490 Encounter for supervision of normal pregnancy, unspecified, unspecified trimester: Secondary | ICD-10-CM

## 2015-02-07 DIAGNOSIS — O99824 Streptococcus B carrier state complicating childbirth: Secondary | ICD-10-CM | POA: Diagnosis present

## 2015-02-07 DIAGNOSIS — Z833 Family history of diabetes mellitus: Secondary | ICD-10-CM

## 2015-02-07 DIAGNOSIS — O4202 Full-term premature rupture of membranes, onset of labor within 24 hours of rupture: Principal | ICD-10-CM | POA: Diagnosis present

## 2015-02-07 DIAGNOSIS — O9832 Other infections with a predominantly sexual mode of transmission complicating childbirth: Secondary | ICD-10-CM | POA: Diagnosis present

## 2015-02-07 NOTE — OB Triage Note (Signed)
Pt. reports sporadic cx for the last 4 to 5 days, waning today. Reports increasing pressure and LOF.

## 2015-02-08 ENCOUNTER — Other Ambulatory Visit: Payer: Self-pay | Admitting: Obstetrics and Gynecology

## 2015-02-08 ENCOUNTER — Inpatient Hospital Stay: Payer: 59 | Admitting: Registered Nurse

## 2015-02-08 DIAGNOSIS — Z3483 Encounter for supervision of other normal pregnancy, third trimester: Secondary | ICD-10-CM | POA: Diagnosis not present

## 2015-02-08 DIAGNOSIS — O99344 Other mental disorders complicating childbirth: Secondary | ICD-10-CM | POA: Diagnosis present

## 2015-02-08 DIAGNOSIS — Z3490 Encounter for supervision of normal pregnancy, unspecified, unspecified trimester: Secondary | ICD-10-CM

## 2015-02-08 DIAGNOSIS — O479 False labor, unspecified: Secondary | ICD-10-CM | POA: Diagnosis present

## 2015-02-08 DIAGNOSIS — O4410 Placenta previa with hemorrhage, unspecified trimester: Secondary | ICD-10-CM | POA: Diagnosis present

## 2015-02-08 DIAGNOSIS — O4202 Full-term premature rupture of membranes, onset of labor within 24 hours of rupture: Secondary | ICD-10-CM | POA: Diagnosis present

## 2015-02-08 DIAGNOSIS — O9832 Other infections with a predominantly sexual mode of transmission complicating childbirth: Secondary | ICD-10-CM | POA: Diagnosis present

## 2015-02-08 DIAGNOSIS — O99824 Streptococcus B carrier state complicating childbirth: Secondary | ICD-10-CM | POA: Diagnosis present

## 2015-02-08 DIAGNOSIS — Z3A37 37 weeks gestation of pregnancy: Secondary | ICD-10-CM | POA: Diagnosis present

## 2015-02-08 DIAGNOSIS — Z833 Family history of diabetes mellitus: Secondary | ICD-10-CM | POA: Diagnosis not present

## 2015-02-08 DIAGNOSIS — F419 Anxiety disorder, unspecified: Secondary | ICD-10-CM | POA: Diagnosis present

## 2015-02-08 DIAGNOSIS — A6000 Herpesviral infection of urogenital system, unspecified: Secondary | ICD-10-CM | POA: Diagnosis present

## 2015-02-08 LAB — CBC
HEMATOCRIT: 35.5 % (ref 35.0–47.0)
HEMOGLOBIN: 12.2 g/dL (ref 12.0–16.0)
MCH: 30.9 pg (ref 26.0–34.0)
MCHC: 34.3 g/dL (ref 32.0–36.0)
MCV: 90.1 fL (ref 80.0–100.0)
Platelets: 263 10*3/uL (ref 150–440)
RBC: 3.95 MIL/uL (ref 3.80–5.20)
RDW: 13.9 % (ref 11.5–14.5)
WBC: 11.7 10*3/uL — ABNORMAL HIGH (ref 3.6–11.0)

## 2015-02-08 LAB — ROM PLUS (ARMC ONLY): Rom Plus: NEGATIVE

## 2015-02-08 LAB — TYPE AND SCREEN
ABO/RH(D): O POS
Antibody Screen: NEGATIVE

## 2015-02-08 MED ORDER — OXYTOCIN 40 UNITS IN LACTATED RINGERS INFUSION - SIMPLE MED
1.0000 m[IU]/min | INTRAVENOUS | Status: DC
  Administered 2015-02-08: 1 m[IU]/min via INTRAVENOUS

## 2015-02-08 MED ORDER — MISOPROSTOL 200 MCG PO TABS
ORAL_TABLET | ORAL | Status: AC
  Filled 2015-02-08: qty 4

## 2015-02-08 MED ORDER — LIDOCAINE-EPINEPHRINE (PF) 1.5 %-1:200000 IJ SOLN
INTRAMUSCULAR | Status: DC | PRN
  Administered 2015-02-08: 3 mL via PERINEURAL
  Administered 2015-02-08: 3 mL via EPIDURAL

## 2015-02-08 MED ORDER — SODIUM CHLORIDE 0.9 % IJ SOLN
INTRAMUSCULAR | Status: AC
Start: 2015-02-08 — End: 2015-02-09
  Filled 2015-02-08: qty 10

## 2015-02-08 MED ORDER — OXYTOCIN 40 UNITS IN LACTATED RINGERS INFUSION - SIMPLE MED
62.5000 mL/h | INTRAVENOUS | Status: DC
  Filled 2015-02-08: qty 1000

## 2015-02-08 MED ORDER — OXYTOCIN BOLUS FROM INFUSION: 500.0000 mL | INTRAVENOUS | Status: DC

## 2015-02-08 MED ORDER — ALUM & MAG HYDROXIDE-SIMETH 200-200-20 MG/5ML PO SUSP
30.0000 mL | ORAL | Status: DC | PRN
  Administered 2015-02-08: 30 mL via ORAL

## 2015-02-08 MED ORDER — ALUM & MAG HYDROXIDE-SIMETH 200-200-20 MG/5ML PO SUSP
ORAL | Status: AC
  Administered 2015-02-08: 30 mL via ORAL
  Filled 2015-02-08: qty 30

## 2015-02-08 MED ORDER — LACTATED RINGERS IV SOLN
INTRAVENOUS | Status: DC
  Administered 2015-02-08 (×3): via INTRAVENOUS
  Administered 2015-02-08: 500 mL via INTRAVENOUS

## 2015-02-08 MED ORDER — OXYTOCIN 10 UNIT/ML IJ SOLN
INTRAMUSCULAR | Status: AC
  Filled 2015-02-08: qty 2

## 2015-02-08 MED ORDER — TERBUTALINE SULFATE 1 MG/ML IJ SOLN: 0.2500 mg | Freq: Once | INTRAMUSCULAR | Status: DC | PRN

## 2015-02-08 MED ORDER — SODIUM CHLORIDE 0.9 % IV SOLN
1.0000 g | INTRAVENOUS | Status: DC
  Administered 2015-02-08 (×3): 1 g via INTRAVENOUS
  Filled 2015-02-08 (×4): qty 1000

## 2015-02-08 MED ORDER — LIDOCAINE HCL (PF) 1 % IJ SOLN
30.0000 mL | INTRAMUSCULAR | Status: DC | PRN
  Filled 2015-02-08: qty 30

## 2015-02-08 MED ORDER — ONDANSETRON HCL 4 MG/2ML IJ SOLN: 4.0000 mg | Freq: Four times a day (QID) | INTRAMUSCULAR | Status: DC | PRN

## 2015-02-08 MED ORDER — SODIUM CHLORIDE 0.9 % IV SOLN
INTRAVENOUS | Status: AC
  Administered 2015-02-08: 2 g via INTRAVENOUS
  Filled 2015-02-08: qty 2000

## 2015-02-08 MED ORDER — AMMONIA AROMATIC IN INHA
RESPIRATORY_TRACT | Status: AC
  Filled 2015-02-08: qty 10

## 2015-02-08 MED ORDER — FENTANYL 2.5 MCG/ML W/ROPIVACAINE 0.2% IN NS 100 ML EPIDURAL INFUSION (ARMC-ANES)
EPIDURAL | Status: AC
  Administered 2015-02-08: 10 mL/h via EPIDURAL
  Filled 2015-02-08: qty 100

## 2015-02-08 MED ORDER — LIDOCAINE HCL (PF) 1 % IJ SOLN
INTRAMUSCULAR | Status: AC
  Administered 2015-02-08: 3 mL via INTRADERMAL
  Filled 2015-02-08: qty 30

## 2015-02-08 MED ORDER — SODIUM CHLORIDE 0.9 % IV SOLN
2.0000 g | Freq: Once | INTRAVENOUS | Status: AC
  Administered 2015-02-08: 2 g via INTRAVENOUS

## 2015-02-08 MED ORDER — SODIUM CHLORIDE 0.9 % IJ SOLN
INTRAMUSCULAR | Status: AC
Start: 2015-02-08 — End: 2015-02-09
  Filled 2015-02-08: qty 6

## 2015-02-08 MED ORDER — LACTATED RINGERS IV SOLN: 500.0000 mL | INTRAVENOUS | Status: DC | PRN

## 2015-02-08 MED ORDER — BUPIVACAINE HCL (PF) 0.25 % IJ SOLN
INTRAMUSCULAR | Status: DC | PRN
  Administered 2015-02-08 (×2): 4 mL via EPIDURAL
  Administered 2015-02-08: 8 mL

## 2015-02-08 NOTE — Anesthesia Preprocedure Evaluation (Signed)
Anesthesia Evaluation  Patient identified by MRN, date of birth, ID band Patient awake    Reviewed: Allergy & Precautions, H&P , NPO status , Patient's Chart, lab work & pertinent test results  History of Anesthesia Complications Negative for: history of anesthetic complications  Airway Mallampati: II  TM Distance: >3 FB Neck ROM: full    Dental no notable dental hx.    Pulmonary neg pulmonary ROS,    Pulmonary exam normal       Cardiovascular negative cardio ROS Normal cardiovascular exam    Neuro/Psych negative neurological ROS  negative psych ROS   GI/Hepatic negative GI ROS, Neg liver ROS,   Endo/Other  negative endocrine ROS  Renal/GU negative Renal ROS  negative genitourinary   Musculoskeletal   Abdominal   Peds  Hematology negative hematology ROS (+)   Anesthesia Other Findings   Reproductive/Obstetrics (+) Pregnancy                             Anesthesia Physical Anesthesia Plan  ASA: II  Anesthesia Plan: Epidural   Post-op Pain Management:    Induction:   Airway Management Planned:   Additional Equipment:   Intra-op Plan:   Post-operative Plan:   Informed Consent: I have reviewed the patients History and Physical, chart, labs and discussed the procedure including the risks, benefits and alternatives for the proposed anesthesia with the patient or authorized representative who has indicated his/her understanding and acceptance.     Plan Discussed with: Anesthesiologist  Anesthesia Plan Comments:         Anesthesia Quick Evaluation

## 2015-02-08 NOTE — Progress Notes (Signed)
Rebecca Evans is a 29 y.o. G2P1001 at [redacted]w[redacted]d by LMP admitted for active labor, rupture of membranes, PROM  Subjective:   Objective: BP 124/79 mmHg  Pulse 96  Temp(Src) 98 F (36.7 C) (Oral)  Resp 16  Ht 5\' 7"  (1.702 m)  Wt 81.194 kg (179 lb)  BMI 28.03 kg/m2  LMP 05/22/2014 (LMP Unknown)   Total I/O In: 1110.6 [I.V.:1110.6] Out: 3 [Urine:3]  Fetal Wellbeing:  Reactive without decels UC:   regular, every 2-3 minutes on 70mu/min pitocin SVE:   Dilation: 4 Effacement (%): 80 Station: -1 Exam by:: [employer]: Lab Results  Component Value Date   WBC 11.7* 02/08/2015   HGB 12.2 02/08/2015   HCT 35.5 02/08/2015   MCV 90.1 02/08/2015   PLT 263 02/08/2015    Assessment / Plan: Augmentation of labor, progressing well  Labor: Progressing normally Preeclampsia:  NA Pain Control:  Epidural ordered Anticipated MOD:  NSVD  Melody Burr 02/08/2015, 1:38 PM

## 2015-02-08 NOTE — Progress Notes (Signed)
Mee Macdonnell [place] is a 29 y.o. G2P1001 at [redacted]w[redacted]d by LMP admitted for rupture of membranes  Subjective:   Objective: BP 115/67 mmHg  Pulse 93  Temp(Src) 97.6 F (36.4 C) (Oral)  Resp 16  Ht 5\' 7"  (1.702 m)  Wt 81.194 kg (179 lb)  BMI 28.03 kg/m2  SpO2 97%  LMP 05/22/2014 (LMP Unknown) I/O last 3 completed shifts: In: 1211.5 [I.V.:1211.5] Out: 3 [Urine:3]    Fetal Wellbeing:  Category I UC:   regular, every 2 minutes, 19 mu Pitocin with now adequate MVU's SVE:   Dilation: 9 Effacement (%): 90 Station: +1 Exam by:: C. Elder  Labs: Lab Results  Component Value Date   WBC 11.7* 02/08/2015   HGB 12.2 02/08/2015   HCT 35.5 02/08/2015   MCV 90.1 02/08/2015   PLT 263 02/08/2015    Assessment / Plan: Augmentation of labor, progressing well  Labor: Progressing normally Preeclampsia:  n/a Pain Control:  Epidural Anticipated MOD:  NSVD  Seen with [place] Mohammed Kindle Elder, RN-C, SNM Korby Ratay Burr 02/08/2015, 8:04 PM

## 2015-02-08 NOTE — H&P (Signed)
Obstetric History and Physical  Rebecca Evans is a 29 y.o. G2P1001 with IUP at [redacted]w[redacted]d presenting with SROM at 1600 on 02/07/15. Patient states she has been having  irregular, every 4-5 minutes contractions, none vaginal bleeding, intact, ruptured, clear fluid, NTZ + membranes, with active fetal movement.    Prenatal Course Source of Care: [facility]  Pregnancy complications or risks:GBS + and recurrent UTIs  Prenatal labs and studies: ABO, Rh: --/--/O POS (09/06 4174) Antibody: NEG (09/06 0324) Rubella: Immune (02/02 0000) RPR: Nonreactive (02/02 0000)  HBsAg: Negative (02/02 0000)  HIV: Non-reactive (02/02 0000)  GBS:  1 hr Glucola  normal Genetic screening normal Anatomy US normal  Past Medical History  Diagnosis Date  . Low-lying placenta   . Anxiety   . Herpes genitalis in women     Past Surgical History  Procedure Laterality Date  . Laproscopy      OB History  Gravida Para Term Preterm AB SAB TAB Ectopic Multiple Living  2 1 1       1     # Outcome Date GA Lbr Len/2nd Weight Sex Delivery Anes PTL Lv  2 Current           1 Term 05/07/13    F Vag-Spont   Y      Social History   Social History  . Marital Status: Married    Spouse Name: N/A  . Number of Children: N/A  . Years of Education: N/A   Social History Main Topics  . Smoking status: Never Smoker   . Smokeless tobacco: Never Used  . Alcohol Use: No  . Drug Use: No  . Sexual Activity: Yes    Birth Control/ Protection: None   Other Topics Concern  . None   Social History Narrative    Family History  Problem Relation Age of Onset  . Diabetes Maternal Grandmother   . Diabetes Maternal Grandfather     Prescriptions prior to admission  Medication Sig Dispense Refill Last Dose  . cholecalciferol (VITAMIN D) 400 UNITS TABS tablet Take 400 Units by mouth.   Taking  . nitrofurantoin, macrocrystal-monohydrate, (MACROBID) 100 MG capsule Take 100 mg by mouth daily.   Taking  . omeprazole (PRILOSEC) 20  MG capsule Take 20 mg by mouth daily.   Taking  . Prenatal Vit-Fe Fumarate-FA (PRENATAL MULTIVITAMIN) TABS tablet Take 1 tablet by mouth daily at 12 noon.   Taking    No Known Allergies  Review of Systems: Negative except for what is mentioned in HPI.  Physical Exam: BP 123/73 mmHg  Pulse 89  Temp(Src) 98.2 F (36.8 C) (Oral)  Resp 16  Ht 5\' 7"  (1.702 m)  Wt 81.194 kg (179 lb)  BMI 28.03 kg/m2  LMP 05/22/2014 (LMP Unknown) GENERAL: Well-developed, well-nourished female in no acute distress.  LUNGS: Clear to auscultation bilaterally.  HEART: Regular rate and rhythm. ABDOMEN: Soft, nontender, nondistended, gravid. EXTREMITIES: Nontender, no edema, 2+ distal pulses. Cervical Exam: Dilation: 3 Effacement (%): 70 Station: -3 Presentation: Vertex Exam by:: Palma Holter, RN FHT:  Baseline rate 125 bpm   Variability moderate  Accelerations present   Decelerations none Contractions: Every 3-5 mins   Pertinent Labs/Studies:   Results for orders placed or performed during the hospital encounter of 02/07/15 (from the past 24 hour(s))  ROM Plus ([facility] only)     Status: None   Collection Time: 02/08/15  1:04 AM  Result Value Ref Range   Rom Plus NEGATIVE   CBC  Status: Abnormal   Collection Time: 02/08/15  3:23 AM  Result Value Ref Range   WBC 11.7 (H) 3.6 - 11.0 K/uL   RBC 3.95 3.80 - 5.20 MIL/uL   Hemoglobin 12.2 12.0 - 16.0 g/dL   HCT 35.5 35.0 - 47.0 %   MCV 90.1 80.0 - 100.0 fL   MCH 30.9 26.0 - 34.0 pg   MCHC 34.3 32.0 - 36.0 g/dL   RDW 13.9 11.5 - 14.5 %   Platelets 263 150 - 440 K/uL  Type and screen     Status: None   Collection Time: 02/08/15  3:24 AM  Result Value Ref Range   ABO/RH(D) O POS    Antibody Screen NEG    Sample Expiration 02/11/2015     Assessment : Rebecca Evans is a 29 y.o. G2P1001 at [redacted]w[redacted]d being admitted for labor.  Plan: Labor: Expectant management.  Induction/Augmentation as needed, per protocol FWB: Reassuring fetal heart tracing.   GBS positive Delivery plan: Hopeful for vaginal delivery  Melody Trudee Kuster, CNM [facility]

## 2015-02-08 NOTE — Anesthesia Procedure Notes (Addendum)
Epidural Patient location during procedure: OB Start time: 02/08/2015 1:46 PM End time: 02/08/2015 1:49 PM  Staffing Resident/CRNA: Raseel Jans Performed by: resident/CRNA   Preanesthetic Checklist Completed: patient identified, site marked, surgical consent, pre-op evaluation, timeout performed, IV checked, risks and benefits discussed and monitors and equipment checked  Epidural Patient position: sitting Prep: Betadine Patient monitoring: heart rate, continuous pulse ox and blood pressure Approach: midline Location: L3-L4 Injection technique: LOR air  Needle:  Needle type: Tuohy  Needle gauge: 18 G Needle length: 9 cm and 9 Needle insertion depth: 6 cm Catheter type: closed end flexible Catheter size: 20 Guage Catheter at skin depth: 11 cm Test dose: negative and 1.5% lidocaine with Epi 1:200 K  Assessment Events: blood not aspirated, injection not painful, no injection resistance, negative IV test and no paresthesia  Additional Notes Patient tolerated the insertion well without complications.Reason for block:procedure for pain  Epidural Patient location during procedure: OB Start time: 02/08/2015 2:16 PM End time: 02/08/2015 2:36 PM  Staffing Resident/CRNA: Othello Dickenson  Preanesthetic Checklist Completed: patient identified, site marked, surgical consent, pre-op evaluation, timeout performed, IV checked, risks and benefits discussed and monitors and equipment checked  Epidural Patient position: sitting Prep: Betadine Patient monitoring: heart rate, continuous pulse ox and blood pressure Approach: midline Location: L4-L5 Injection technique: LOR saline  Needle:  Needle type: Tuohy  Needle gauge: 18 G Needle length: 9 cm and 9 Needle insertion depth: 6 cm Catheter type: closed end flexible Catheter size: 20 Guage Catheter at skin depth: 12 cm Test dose: negative and 1.5% lidocaine with Epi 1:200 K  Assessment Sensory level: T10 Events: blood not aspirated,  injection not painful, no injection resistance, negative IV test and no paresthesia  Additional Notes This is 2nd attempt.  !st placement failed and didn't work.  Pt still complained of pain.    Patient tolerated the insertion well without complications.  Pt said that she was getting good pain relief after 2nd placement.  Reason for block:procedure for pain

## 2015-02-08 NOTE — Progress Notes (Signed)
Rebecca Evans is a 29 y.o. G2P1001 at [redacted]w[redacted]d by LMP admitted for rupture of membranes  Subjective:   Objective: BP 96/41 mmHg  Pulse 94  Temp(Src) 97.6 F (36.4 C) (Oral)  Resp 16  Ht 5\' 7"  (1.702 m)  Wt 81.194 kg (179 lb)  BMI 28.03 kg/m2  SpO2 97%  LMP 05/22/2014 (LMP Unknown)   Total I/O In: 1110.6 [I.V.:1110.6] Out: 3 [Urine:3]  Fetal Wellbeing:  Category I UC:   regular, every 3 minutes SVE:   Dilation: 4 Effacement (%): 80 Station: -2 Exam by:: M.Burr, CNM  Labs: Lab Results  Component Value Date   WBC 11.7* 02/08/2015   HGB 12.2 02/08/2015   HCT 35.5 02/08/2015   MCV 90.1 02/08/2015   PLT 263 02/08/2015    Assessment / Plan: Augmentation of labor, progressing well  Labor: IUPC placed for improved oxytocin titration, foley catheter placed  Preeclampsia:  N/A Pain Control:  Epidural Anticipated MOD:  NSVD   Cassandra Elder, RN-C, SNM see with  Melody Burr 02/08/2015, 3:43 PM

## 2015-02-09 ENCOUNTER — Encounter: Payer: 59 | Admitting: Obstetrics and Gynecology

## 2015-02-09 LAB — GC/CHLAMYDIA PROBE AMP
Chlamydia trachomatis, NAA: NEGATIVE
Neisseria gonorrhoeae by PCR: NEGATIVE

## 2015-02-09 LAB — CBC
HEMATOCRIT: 33.1 % — AB (ref 35.0–47.0)
Hemoglobin: 11.1 g/dL — ABNORMAL LOW (ref 12.0–16.0)
MCH: 29.8 pg (ref 26.0–34.0)
MCHC: 33.6 g/dL (ref 32.0–36.0)
MCV: 88.9 fL (ref 80.0–100.0)
Platelets: 235 10*3/uL (ref 150–440)
RBC: 3.72 MIL/uL — ABNORMAL LOW (ref 3.80–5.20)
RDW: 13.6 % (ref 11.5–14.5)
WBC: 12.1 10*3/uL — AB (ref 3.6–11.0)

## 2015-02-09 LAB — URINE CULTURE
Culture: NO GROWTH
Special Requests: NORMAL

## 2015-02-09 LAB — RPR: RPR: NONREACTIVE

## 2015-02-09 MED ORDER — OXYCODONE-ACETAMINOPHEN 5-325 MG PO TABS
1.0000 | ORAL_TABLET | ORAL | Status: DC | PRN
  Administered 2015-02-09 (×2): 1 via ORAL
  Filled 2015-02-09 (×2): qty 1

## 2015-02-09 MED ORDER — ACETAMINOPHEN 325 MG PO TABS
650.0000 mg | ORAL_TABLET | ORAL | Status: DC | PRN
Start: 2015-02-09 — End: 2015-02-10

## 2015-02-09 MED ORDER — LANOLIN HYDROUS EX OINT: TOPICAL_OINTMENT | CUTANEOUS | Status: DC | PRN

## 2015-02-09 MED ORDER — DIPHENHYDRAMINE HCL 25 MG PO CAPS: 25.0000 mg | ORAL_CAPSULE | Freq: Four times a day (QID) | ORAL | Status: DC | PRN

## 2015-02-09 MED ORDER — ONDANSETRON HCL 4 MG/2ML IJ SOLN: 4.0000 mg | INTRAMUSCULAR | Status: DC | PRN

## 2015-02-09 MED ORDER — IBUPROFEN 600 MG PO TABS
600.0000 mg | ORAL_TABLET | Freq: Four times a day (QID) | ORAL | Status: DC
  Administered 2015-02-09 – 2015-02-10 (×4): 600 mg via ORAL
  Filled 2015-02-09 (×5): qty 1

## 2015-02-09 MED ORDER — BENZOCAINE-MENTHOL 20-0.5 % EX AERO: 1.0000 "application " | INHALATION_SPRAY | CUTANEOUS | Status: DC | PRN

## 2015-02-09 MED ORDER — ONDANSETRON HCL 4 MG PO TABS: 4.0000 mg | ORAL_TABLET | ORAL | Status: DC | PRN

## 2015-02-09 MED ORDER — OXYCODONE-ACETAMINOPHEN 5-325 MG PO TABS: 2.0000 | ORAL_TABLET | ORAL | Status: DC | PRN

## 2015-02-09 MED ORDER — SIMETHICONE 80 MG PO CHEW: 80.0000 mg | CHEWABLE_TABLET | ORAL | Status: DC | PRN

## 2015-02-09 MED ORDER — PRENATAL MULTIVITAMIN CH
1.0000 | ORAL_TABLET | Freq: Every day | ORAL | Status: DC
  Administered 2015-02-09 – 2015-02-10 (×2): 1 via ORAL
  Filled 2015-02-09 (×2): qty 1

## 2015-02-09 MED ORDER — OXYTOCIN 40 UNITS IN LACTATED RINGERS INFUSION - SIMPLE MED: 62.5000 mL/h | INTRAVENOUS | Status: DC | PRN

## 2015-02-09 MED ORDER — TETANUS-DIPHTH-ACELL PERTUSSIS 5-2.5-18.5 LF-MCG/0.5 IM SUSP
0.5000 mL | Freq: Once | INTRAMUSCULAR | Status: AC
  Administered 2015-02-09: 0.5 mL via INTRAMUSCULAR
  Filled 2015-02-09: qty 0.5

## 2015-02-09 MED ORDER — SENNOSIDES-DOCUSATE SODIUM 8.6-50 MG PO TABS
2.0000 | ORAL_TABLET | ORAL | Status: DC
  Administered 2015-02-09: 2 via ORAL
  Filled 2015-02-09: qty 2

## 2015-02-09 NOTE — Progress Notes (Signed)
Post Partum Day 1 Subjective: sore nipples  Objective: Blood pressure 110/67, pulse 86, temperature 98.4 F (36.9 C), temperature source Oral, resp. rate 17, height 5\' 7"  (1.702 m), weight 81.194 kg (179 lb), last menstrual period 05/22/2014, SpO2 96 %.  Physical Exam:  General: alert, cooperative and icteric Lochia: appropriate Uterine Fundus: firm Incision: n/a DVT Evaluation: No evidence of DVT seen on physical exam.   Recent Labs  02/08/15 0323 02/09/15 0417  HGB 12.2 11.1*  HCT 35.5 33.1*    Assessment/Plan: Plan for discharge tomorrow and Breastfeeding Infant feeding exclusive Breast    LOS: 1 day  Seen with Gennie Alma, CNM Cassandra Elder, RN-C, SNM  Avani Sensabaugh Burr 02/09/2015, 8:09 AM

## 2015-02-10 NOTE — Progress Notes (Signed)
Patient discharged home with infant. Discharge instructions, prescriptions and follow up appointment given to and reviewed with patient. Patient verbalized understanding. Escorted out with infant by auxillary.

## 2015-02-10 NOTE — Anesthesia Postprocedure Evaluation (Signed)
  Anesthesia Post-op Note  Patient: Rebecca Evans  Procedure(s) Performed: * No procedures listed *  Anesthesia type:Epidural  Patient location: 342  Post pain: Pain level controlled  Post assessment: Post-op Vital signs reviewed, Patient's Cardiovascular Status Stable, Respiratory Function Stable, Patent Airway and No signs of Nausea or vomiting  Post vital signs: Reviewed and stable  Last Vitals:  Filed Vitals:   02/10/15 0817  BP: 118/71  Pulse: 76  Temp: 36.9 C  Resp: 18    Level of consciousness: awake, alert  and patient cooperative  Complications: No apparent anesthesia complications

## 2015-02-10 NOTE — Discharge Summary (Signed)
Obstetric Discharge Summary Reason for Admission: rupture of membranes Prenatal Procedures: ultrasound Intrapartum Procedures: spontaneous vaginal delivery Postpartum Procedures: none Complications-Operative and Postpartum: none HEMOGLOBIN  Date Value Ref Range Status  02/09/2015 11.1* 12.0 - 16.0 g/dL Final  07/06/2014 13.4 g/dL Final   HGB  Date Value Ref Range Status  05/06/2013 10.6* 12.0-16.0 g/dL Final   HCT  Date Value Ref Range Status  02/09/2015 33.1* 35.0 - 47.0 % Final  07/06/2014 39 % Final  05/09/2013 34.7* 35.0-47.0 % Final   HEMATOCRIT  Date Value Ref Range Status  12/07/2014 35.1 34.0 - 46.6 % Final    Physical Exam:  General: alert, cooperative, appears stated age and fatigued Lochia: appropriate Uterine Fundus: firm Incision: NA DVT Evaluation: No evidence of DVT seen on physical exam. Negative Homan's sign.  Discharge Diagnoses: PROM x18 hours and labor onset at [redacted]w[redacted]d  Discharge Information: Date: 02/10/2015 Activity: pelvic rest Diet: routine Medications: PNV and Ibuprofen Condition: stable Instructions: refer to practice specific booklet and schedule appointment at 5 weeks for nexplanon insertion Discharge to: home   Newborn Data: Live born female breast feeding well Birth Weight: 7 lb 5.1 oz (3320 g) APGAR:8 ,9   Home with mother.  Rebecca Evans Burr 02/10/2015, 8:14 AM

## 2015-02-16 ENCOUNTER — Encounter: Payer: 59 | Admitting: Obstetrics and Gynecology

## 2015-03-18 ENCOUNTER — Ambulatory Visit: Payer: 59 | Admitting: Obstetrics and Gynecology

## 2015-03-22 ENCOUNTER — Ambulatory Visit (INDEPENDENT_AMBULATORY_CARE_PROVIDER_SITE_OTHER): Payer: 59 | Admitting: Obstetrics and Gynecology

## 2015-03-22 ENCOUNTER — Encounter: Payer: Self-pay | Admitting: Obstetrics and Gynecology

## 2015-03-22 MED ORDER — FLUCONAZOLE 150 MG PO TABS: 150.0000 mg | ORAL_TABLET | Freq: Every day | ORAL | Status: DC

## 2015-03-22 MED ORDER — CEFDINIR 300 MG PO CAPS: 300.0000 mg | ORAL_CAPSULE | Freq: Two times a day (BID) | ORAL | Status: DC

## 2015-03-22 NOTE — Progress Notes (Signed)
  Subjective:     Rebecca Evans is a 29 y.o. female who presents for a postpartum visit. She is 6 weeks postpartum following a spontaneous vaginal delivery. I have fully reviewed the prenatal and intrapartum course. The delivery was at 87 gestational weeks. Outcome: spontaneous vaginal delivery. Anesthesia: epidural. Postpartum course has been uneventful. Baby's course has been uneventful. Baby is feeding by breast. Bleeding no bleeding. Bowel function is normal. Bladder function is normal. Patient is not sexually active. Contraception method is abstinence. Postpartum depression screening: negative.  The following portions of the patient's history were reviewed and updated as appropriate: allergies, current medications, past family history, past medical history, past social history, past surgical history and problem list.  Review of Systems A comprehensive review of systems was negative except for: Ears, nose, mouth, throat, and face: positive for earaches, nasal congestion and sore throat   Objective:    BP 117/80 mmHg  Pulse 88  Ht 5\' 7"  (1.702 m)  Wt 157 lb 6.4 oz (71.396 kg)  BMI 24.65 kg/m2  Breastfeeding? Yes  General:  alert, cooperative and appears stated age   Breasts:  inspection negative, no nipple discharge or bleeding, no masses or nodularity palpable  Lungs: clear to auscultation bilaterally  Heart:  regular rate and rhythm, S1, S2 normal, no murmur, click, rub or gallop  Abdomen: soft, non-tender; bowel sounds normal; no masses,  no organomegaly   Vulva:  normal  Vagina: normal vagina  Cervix:  multiparous appearance  Corpus: normal size, contour, position, consistency, mobility, non-tender  Adnexa:  no mass, fullness, tenderness  Rectal Exam: Normal rectovaginal exam       Ears with clear fluid bulging behind membranes bilaterally, tonsils red and lymphadenopathy noted. Assessment:     6 weeks postpartum exam. Pap smear not done at today's visit.   Plan:    1. Contraception: Nexplanon 2. Sinusitis, acute 3. Follow up in: 4 weeks for AE or as needed.

## 2015-03-22 NOTE — Patient Instructions (Signed)
  Place postpartum visit patient instructions here.  

## 2015-03-23 ENCOUNTER — Encounter: Payer: Self-pay | Admitting: Obstetrics and Gynecology

## 2015-03-23 ENCOUNTER — Ambulatory Visit (INDEPENDENT_AMBULATORY_CARE_PROVIDER_SITE_OTHER): Payer: 59 | Admitting: Obstetrics and Gynecology

## 2015-03-23 VITALS — BP 114/60 | HR 92 | Wt 155.5 lb

## 2015-03-23 DIAGNOSIS — Z30017 Encounter for initial prescription of implantable subdermal contraceptive: Secondary | ICD-10-CM | POA: Diagnosis not present

## 2015-03-23 MED ORDER — ETONOGESTREL 68 MG ~~LOC~~ IMPL: 1.0000 | DRUG_IMPLANT | Freq: Once | SUBCUTANEOUS | Status: DC

## 2015-03-23 NOTE — Progress Notes (Signed)
Rebecca Evans is a 29 y.o. year old Caucasian female here for Nexplanon insertion.  No LMP recorded., last sexual intercourse was >6 weeks, and her pregnancy test today was negative.  Risks/benefits/side effects of Nexplanon have been discussed and her questions have been answered.  Specifically, a failure rate of 06/998 has been reported, with an increased failure rate if pt takes [place] and/or antiseizure medicaitons.  Rebecca Evans is aware of the common side effect of irregular bleeding, which the incidence of decreases over time.  BP 114/60 mmHg  Pulse 92  Wt 155 lb 8 oz (70.534 kg)  Breastfeeding? Yes  No results found for this or any previous visit (from the past 24 hour(s)).   She is right-handed, so her left arm, approximately 4 inches proximal from the elbow, was cleansed with alcohol and anesthetized with 2cc of 2% Lidocaine.  The area was cleansed again with betadine and the Nexplanon was inserted per manufacturer's recommendations without difficulty.  A steri-strip and pressure bandage were applied.  Pt was instructed to keep the area clean and dry, remove pressure bandage in 24 hours, and keep insertion site covered with the steri-strip for 3-5 days.  Back up contraception was recommended for 2 weeks.  She was given a card indicating date Nexplanon was inserted and date it needs to be removed. Follow-up PRN problems.  Rebecca Evans,CNM

## 2015-05-27 ENCOUNTER — Ambulatory Visit: Payer: 59 | Admitting: Physician Assistant

## 2015-05-27 ENCOUNTER — Encounter: Payer: Self-pay | Admitting: Physician Assistant

## 2015-05-27 VITALS — BP 110/80 | HR 80 | Temp 98.4°F

## 2015-05-27 MED ORDER — CEFDINIR 300 MG PO CAPS: 300.0000 mg | ORAL_CAPSULE | Freq: Two times a day (BID) | ORAL | Status: DC

## 2015-05-27 NOTE — Progress Notes (Signed)
S: C/o runny nose and congestion for 3 days, no fever, chills, cp/sob, v/d; mucus is green and thick, cough is sporadic, c/o of facial and dental pain.   Using otc meds:   O: PE: perrl eomi, normocephalic, tms dull, nasal mucosa red and swollen, throat injected, neck supple no lymph, lungs c t a, cv rrr, neuro intact  A:  Acute sinusitis   P: omnicef 300 mg bid x 10d, eat yogurt to prevent yeast drink fluids, continue regular meds , use otc meds of choice, return if not improving in 5 days, return earlier if worsening

## 2015-07-22 ENCOUNTER — Encounter: Payer: 59 | Admitting: Obstetrics and Gynecology

## 2015-08-12 ENCOUNTER — Ambulatory Visit: Payer: Self-pay | Admitting: Physician Assistant

## 2015-08-12 ENCOUNTER — Encounter: Payer: Self-pay | Admitting: Physician Assistant

## 2015-08-12 VITALS — BP 90/70 | HR 80 | Temp 98.5°F

## 2015-08-12 DIAGNOSIS — R52 Pain, unspecified: Secondary | ICD-10-CM

## 2015-08-12 DIAGNOSIS — R300 Dysuria: Secondary | ICD-10-CM

## 2015-08-12 DIAGNOSIS — B349 Viral infection, unspecified: Secondary | ICD-10-CM

## 2015-08-12 LAB — POCT URINALYSIS DIPSTICK
BILIRUBIN UA: NEGATIVE
GLUCOSE UA: NEGATIVE
KETONES UA: NEGATIVE
LEUKOCYTES UA: NEGATIVE
Nitrite, UA: NEGATIVE
Protein, UA: NEGATIVE
SPEC GRAV UA: 1.025
Urobilinogen, UA: 0.2
pH, UA: 6

## 2015-08-12 LAB — POCT INFLUENZA A/B
Influenza A, POC: NEGATIVE
Influenza B, POC: NEGATIVE

## 2015-08-12 NOTE — Progress Notes (Signed)
S: c/o urinary freq and vaginal itching for about 2 weeks, no fever/chills, does have a headache and some body aches, husband and child both tested + for the flu, did get flu shot,  Is breastfeeding  O: vitals wnl nad, ent wnl, neck supple no lymph, lungs c t a, cv rrr, flu swab neg, ua neg  A: vaginal itching, viral uri  P: reassurance, otc meds of choice, f/u with gyn for vaginal itching

## 2015-11-29 ENCOUNTER — Encounter: Payer: Self-pay | Admitting: Physician Assistant

## 2015-11-29 ENCOUNTER — Ambulatory Visit: Payer: Self-pay | Admitting: Physician Assistant

## 2015-11-29 VITALS — BP 110/70 | HR 80 | Temp 98.4°F

## 2015-11-29 DIAGNOSIS — J018 Other acute sinusitis: Secondary | ICD-10-CM

## 2015-11-29 NOTE — Progress Notes (Signed)
S: C/o runny nose and congestion for 4 days, no fever, chills, cp/sob, v/d; mucus is green and thick with a mixture of blood, cough is sporadic, c/o of facial and dental pain.   Using otc meds: none, is breastfeeding  O: PE: vitals wnl, nad,  perrl eomi, normocephalic, tms dull, nasal mucosa pink and swollen, throat injected, neck supple no lymph, lungs c t a, cv rrr, neuro intact  A:  Acute sinusitis   P: drink fluids, continue regular meds , use otc meds of choice, return if not improving in 5 days, return earlier if worsening , use otcmucinex, saline nasal spray, will call in antibiotic if worsening by the end of the week

## 2016-01-02 ENCOUNTER — Encounter: Payer: Self-pay | Admitting: Physician Assistant

## 2016-01-02 ENCOUNTER — Ambulatory Visit: Payer: Self-pay | Admitting: Physician Assistant

## 2016-01-02 VITALS — BP 100/74 | HR 80 | Temp 98.3°F

## 2016-01-02 DIAGNOSIS — R300 Dysuria: Secondary | ICD-10-CM

## 2016-01-02 DIAGNOSIS — N390 Urinary tract infection, site not specified: Secondary | ICD-10-CM

## 2016-01-02 LAB — POCT URINALYSIS DIPSTICK
Bilirubin, UA: NEGATIVE
Blood, UA: NEGATIVE
GLUCOSE UA: NEGATIVE
Ketones, UA: NEGATIVE
NITRITE UA: NEGATIVE
Protein, UA: NEGATIVE
Spec Grav, UA: 1.02
UROBILINOGEN UA: 0.2
pH, UA: 6

## 2016-01-02 MED ORDER — NITROFURANTOIN MONOHYD MACRO 100 MG PO CAPS: 100.0000 mg | ORAL_CAPSULE | Freq: Two times a day (BID) | ORAL | 0 refills | Status: DC

## 2016-01-02 NOTE — Progress Notes (Signed)
S:  C/o uti sx for 2 days, burning, back pain, denies vaginal discharge, abdominal pain or flank pain:  Remainder ros neg  O:  Vitals wnl, nad, no cva tenderness, back nontender, lungs c t a,cv rrr, abd soft tender in luq, bs normal, n/v intact  A: uti  P: macrobid 100 bid x 7d, increase water intake, add cranberry juice, return if not improving in 2 -3 days, return earlier if worsening, discussed pyelonephritis sx, f/u with regular md to evaluate luq pain

## 2016-05-22 ENCOUNTER — Encounter: Payer: Self-pay | Admitting: Physician Assistant

## 2016-05-22 ENCOUNTER — Ambulatory Visit: Payer: Self-pay | Admitting: Physician Assistant

## 2016-05-22 VITALS — BP 100/80 | HR 76 | Temp 98.1°F

## 2016-05-22 DIAGNOSIS — J069 Acute upper respiratory infection, unspecified: Secondary | ICD-10-CM

## 2016-05-22 MED ORDER — BENZONATATE 200 MG PO CAPS: 200.0000 mg | ORAL_CAPSULE | Freq: Three times a day (TID) | ORAL | 0 refills | Status: DC | PRN

## 2016-05-22 MED ORDER — FLUCONAZOLE 150 MG PO TABS: ORAL_TABLET | ORAL | 0 refills | Status: DC

## 2016-05-22 MED ORDER — AMOXICILLIN 875 MG PO TABS: 875.0000 mg | ORAL_TABLET | Freq: Two times a day (BID) | ORAL | 0 refills | Status: DC

## 2016-05-22 NOTE — Progress Notes (Signed)
S: C/o runny nose and congestion for 4 days, no fever, chills, cp/sob, v/d; mucus was green this am but clear and bloody throughout the day, cough is sporadic, + body aches; denies v/d  Using otc meds: robitussin  O: PE: vitals wnl, nad, perrl eomi, normocephalic, tms dull, nasal mucosa red and swollen, throat injected, neck supple no lymph, lungs c t a, cv rrr, neuro intact  A:  Acute  uri   P: drink fluids, continue regular meds , use otc meds of choice, return if not improving in 5 days, return earlier if worsening , amoxil, diflucan, tessalon

## 2016-06-12 ENCOUNTER — Emergency Department: Payer: 59

## 2016-06-12 ENCOUNTER — Emergency Department
Admission: EM | Admit: 2016-06-12 | Discharge: 2016-06-12 | Disposition: A | Discharge status: Home / Self Care | Payer: 59 | Attending: Student in an Organized Health Care Education/Training Program | Admitting: Student in an Organized Health Care Education/Training Program

## 2016-06-12 ENCOUNTER — Encounter: Payer: Self-pay | Admitting: *Deleted

## 2016-06-12 DIAGNOSIS — R1011 Right upper quadrant pain: Secondary | ICD-10-CM | POA: Insufficient documentation

## 2016-06-12 DIAGNOSIS — R0789 Other chest pain: Secondary | ICD-10-CM | POA: Diagnosis not present

## 2016-06-12 DIAGNOSIS — R1013 Epigastric pain: Secondary | ICD-10-CM | POA: Diagnosis not present

## 2016-06-12 LAB — BASIC METABOLIC PANEL
Anion gap: 7 (ref 5–15)
BUN: 7 mg/dL (ref 6–20)
CHLORIDE: 106 mmol/L (ref 101–111)
CO2: 24 mmol/L (ref 22–32)
CREATININE: 0.7 mg/dL (ref 0.44–1.00)
Calcium: 9.1 mg/dL (ref 8.9–10.3)
Glucose, Bld: 110 mg/dL — ABNORMAL HIGH (ref 65–99)
POTASSIUM: 3.2 mmol/L — AB (ref 3.5–5.1)
SODIUM: 137 mmol/L (ref 135–145)

## 2016-06-12 LAB — HEPATIC FUNCTION PANEL
ALBUMIN: 4 g/dL (ref 3.5–5.0)
ALT: 20 U/L (ref 14–54)
AST: 21 U/L (ref 15–41)
Alkaline Phosphatase: 95 U/L (ref 38–126)
BILIRUBIN TOTAL: 0.5 mg/dL (ref 0.3–1.2)
Bilirubin, Direct: <0.1 mg/dL — ABNORMAL LOW (ref 0.1–0.5)
TOTAL PROTEIN: 7.6 g/dL (ref 6.5–8.1)

## 2016-06-12 LAB — CBC
HCT: 42.4 % (ref 35.0–47.0)
Hemoglobin: 14.4 g/dL (ref 12.0–16.0)
MCH: 29.8 pg (ref 26.0–34.0)
MCHC: 34.1 g/dL (ref 32.0–36.0)
MCV: 87.5 fL (ref 80.0–100.0)
PLATELETS: 359 10*3/uL (ref 150–440)
RBC: 4.84 MIL/uL (ref 3.80–5.20)
RDW: 13.1 % (ref 11.5–14.5)
WBC: 12.4 10*3/uL — AB (ref 3.6–11.0)

## 2016-06-12 LAB — TROPONIN I: Troponin I: <0.03 ng/mL (ref <0.03)

## 2016-06-12 MED ORDER — PROMETHAZINE HCL 25 MG PO TABS
25.0000 mg | ORAL_TABLET | Freq: Once | ORAL | Status: AC
  Administered 2016-06-12: 25 mg via ORAL
  Filled 2016-06-12: qty 1

## 2016-06-12 MED ORDER — GI COCKTAIL ~~LOC~~
30.0000 mL | Freq: Once | ORAL | Status: AC
  Administered 2016-06-12: 30 mL via ORAL
  Filled 2016-06-12: qty 30

## 2016-06-12 MED ORDER — PANTOPRAZOLE SODIUM 40 MG PO TBEC: 40.0000 mg | DELAYED_RELEASE_TABLET | Freq: Every day | ORAL | 0 refills | Status: DC

## 2016-06-12 MED ORDER — PROMETHAZINE HCL 12.5 MG PO TABS: 12.5000 mg | ORAL_TABLET | Freq: Four times a day (QID) | ORAL | 0 refills | Status: DC | PRN

## 2016-06-12 MED ORDER — LORAZEPAM 1 MG PO TABS: 1.0000 mg | ORAL_TABLET | Freq: Once | ORAL | Status: DC

## 2016-06-12 MED ORDER — DICYCLOMINE HCL 10 MG PO CAPS: 10.0000 mg | ORAL_CAPSULE | Freq: Three times a day (TID) | ORAL | 0 refills | Status: DC | PRN

## 2016-06-12 NOTE — Discharge Instructions (Signed)

## 2016-06-12 NOTE — ED Triage Notes (Signed)
States chest pain that began yesterday AM, states began in her chest, took GERD medication with no relief, states worsening overnight, states nausea, awake and alert in no acute distress

## 2016-06-12 NOTE — ED Notes (Signed)
Mid to right abd pain, nausea. Pt a/o. No acute distress. Lung sounds clear.

## 2016-06-12 NOTE — ED Provider Notes (Signed)
[facility] Emergency Department Provider Note    First MD Initiated Contact with Patient 06/12/16 1659     (approximate)  I have reviewed the triage vital signs and the nursing notes.   HISTORY  Chief Complaint Chest Pain    HPI Rebecca Evans is a 31 y.o. female who presents with less than 24 hours of epigastric pain originating from the right upper quadrant with radiation through to her back and right shoulder. Patient states that she's never had this pain before. States that she was laying flat was woken in the middle of the night by these symptoms. States that it has waxed and waned since last night. Has had no appetite. No fevers. Denies any shortness of breath or chest pressure. States that she thought it was similar to her reflux that she took a Protonix pill without any improvement in symptoms. She had a but has not had any vomiting.   Past Medical History:  Diagnosis Date  . Anxiety   . Herpes genitalis in women   . Low-lying placenta    Family History  Problem Relation Age of Onset  . Diabetes Maternal Grandmother   . Diabetes Maternal Grandfather    Past Surgical History:  Procedure Laterality Date  . LAPROSCOPY     Patient Active Problem List   Diagnosis Date Noted  . Vaginal delivery 02/09/2015  . Irregular contractions 02/08/2015  . Pregnancy 02/08/2015  . Labor and delivery indication for care or intervention 02/04/2015  . Labor and delivery, indication for care 11/30/2014    4  Prior to Admission medications   Medication Sig Start Date End Date Taking? Authorizing Provider  amoxicillin (AMOXIL) 875 MG tablet Take 1 tablet (875 mg total) by mouth 2 (two) times daily. 05/22/16   Versie Starks, PA-C  benzonatate (TESSALON) 200 MG capsule Take 1 capsule (200 mg total) by mouth 3 (three) times daily as needed for cough. 05/22/16   Versie Starks, PA-C  cholecalciferol (VITAMIN D) 400 UNITS TABS tablet Take 400 Units by  mouth. Reported on 11/29/2015    Historical Provider, MD  dicyclomine (BENTYL) 10 MG capsule Take 1 capsule (10 mg total) by mouth 3 (three) times daily as needed for spasms. 06/12/16 06/26/16  Merlyn Lot, MD  etonogestrel (NEXPLANON) 68 MG IMPL implant 1 each (68 mg total) by Subdermal route once. 03/23/15   Melody N Shambley, CNM  fluconazole (DIFLUCAN) 150 MG tablet Take one now and one in a week 05/22/16   Versie Starks, PA-C  nitrofurantoin, macrocrystal-monohydrate, (MACROBID) 100 MG capsule Take 1 capsule (100 mg total) by mouth 2 (two) times daily. Patient not taking: Reported on 05/22/2016 01/02/16   Versie Starks, PA-C  pantoprazole (PROTONIX) 40 MG tablet Take 1 tablet (40 mg total) by mouth daily. 06/12/16 06/12/17  Merlyn Lot, MD  Prenatal Vit-Fe Fumarate-FA (PRENATAL MULTIVITAMIN) TABS tablet Take 1 tablet by mouth daily at 12 noon.    Historical Provider, MD  promethazine (PHENERGAN) 12.5 MG tablet Take 1 tablet (12.5 mg total) by mouth every 6 (six) hours as needed for nausea or vomiting. 06/12/16   Merlyn Lot, MD    Allergies Patient has no known allergies.    Social History Social History  Substance Use Topics  . Smoking status: Never Smoker  . Smokeless tobacco: Never Used  . Alcohol use No    Review of Systems Patient denies headaches, rhinorrhea, blurry vision, numbness, shortness of breath, chest pain, edema, cough, abdominal  pain, nausea, vomiting, diarrhea, dysuria, fevers, rashes or hallucinations unless otherwise stated above in HPI. ____________________________________________   PHYSICAL EXAM:  VITAL SIGNS: Vitals:   06/12/16 1438  BP: 123/81  Pulse: 93  Resp: 18  Temp: 98.3 F (36.8 C)    Constitutional: Alert and oriented. Well appearing and in no acute distress. Eyes: Conjunctivae are normal. PERRL. EOMI. Head: Atraumatic. Nose: No congestion/rhinnorhea. Mouth/Throat: Mucous membranes are moist.  Oropharynx non-erythematous. Neck:  No stridor. Painless ROM. No cervical spine tenderness to palpation Hematological/Lymphatic/Immunilogical: No cervical lymphadenopathy. Cardiovascular: Normal rate, regular rhythm. Grossly normal heart sounds.  Good peripheral circulation. Respiratory: Normal respiratory effort.  No retractions. Lungs CTAB. Gastrointestinal: Soft with mild epigastric ttp. No distention. No abdominal bruits. No CVA tenderness. Genitourinary:  Musculoskeletal: No lower extremity tenderness nor edema.  No joint effusions. Neurologic:  Normal speech and language. No gross focal neurologic deficits are appreciated. No gait instability. Skin:  Skin is warm, dry and intact. No rash noted. Psychiatric: Mood and affect are normal. Speech and behavior are normal.  ____________________________________________   LABS (all labs ordered are listed, but only abnormal results are displayed)  Results for orders placed or performed during the hospital encounter of 06/12/16 (from the past 24 hour(s))  Basic metabolic panel     Status: Abnormal   Collection Time: 06/12/16  2:12 PM  Result Value Ref Range   Sodium 137 135 - 145 mmol/L   Potassium 3.2 (L) 3.5 - 5.1 mmol/L   Chloride 106 101 - 111 mmol/L   CO2 24 22 - 32 mmol/L   Glucose, Bld 110 (H) 65 - 99 mg/dL   BUN 7 6 - 20 mg/dL   Creatinine, Ser 0.70 0.44 - 1.00 mg/dL   Calcium 9.1 8.9 - 10.3 mg/dL   GFR calc non Af Amer >60 >60 mL/min   GFR calc Af Amer >60 >60 mL/min   Anion gap 7 5 - 15  CBC     Status: Abnormal   Collection Time: 06/12/16  2:12 PM  Result Value Ref Range   WBC 12.4 (H) 3.6 - 11.0 K/uL   RBC 4.84 3.80 - 5.20 MIL/uL   Hemoglobin 14.4 12.0 - 16.0 g/dL   HCT 42.4 35.0 - 47.0 %   MCV 87.5 80.0 - 100.0 fL   MCH 29.8 26.0 - 34.0 pg   MCHC 34.1 32.0 - 36.0 g/dL   RDW 13.1 11.5 - 14.5 %   Platelets 359 150 - 440 K/uL  Troponin I     Status: None   Collection Time: 06/12/16  2:12 PM  Result Value Ref Range   Troponin I <0.03 <0.03 ng/mL    Hepatic function panel     Status: Abnormal   Collection Time: 06/12/16  2:12 PM  Result Value Ref Range   Total Protein 7.6 6.5 - 8.1 g/dL   Albumin 4.0 3.5 - 5.0 g/dL   AST 21 15 - 41 U/L   ALT 20 14 - 54 U/L   Alkaline Phosphatase 95 38 - 126 U/L   Total Bilirubin 0.5 0.3 - 1.2 mg/dL   Bilirubin, Direct <0.1 (L) 0.1 - 0.5 mg/dL   Indirect Bilirubin NOT CALCULATED 0.3 - 0.9 mg/dL   ____________________________________________  EKG My review and personal interpretation at Time: 14:13   Indication: chest pain  Rate: 90  Rhythm: sinus Axis: normal Other: non specific t wave, likely baseline artifact, no STEMI,   ____________________________________________  RADIOLOGY  I personally reviewed all radiographic images ordered to  evaluate for the above acute complaints and reviewed radiology reports and findings.  These findings were personally discussed with the patient.  Please see medical record for radiology report.  ____________________________________________   PROCEDURES  Procedure(s) performed:  Procedures    Critical Care performed: no ____________________________________________   INITIAL IMPRESSION / ASSESSMENT AND PLAN / ED COURSE  Pertinent labs & imaging results that were available during my care of the patient were reviewed by me and considered in my medical decision making (see chart for details).  DDX: ACS, pericarditis, esophagitis, boerhaaves, pna, cholelithiasis, cholecysitis, gastritis   Gaelle Loveday Evans is a 31 y.o. who presents to the ED with epigastric pain as described above. Patient afebrile hemodynamic stable. Chest x-ray shows no evidence of infiltrate or free air under the diaphragm. EKG shows no evidence of ischemia and her troponin is negative. I do not feel this is clinically consistent with cardiac chest pain. She is moving her bowels do not feel that this is clinically consistent with small bowel obstruction. Based on her region pain  will order a right upper quadrant ultrasound to evaluate for biliary pathology. We'll provide GI cocktail and pain treatment to evaluate for any component of gastritis.  Clinical Course as of Jun 12 1854  Tue Jun 12, 2016  1830 Right upper quadrant ultrasound is normal.  [PR]    Clinical Course User Index [PR] Merlyn Lot, MD   ----------------------------------------- 6:56 PM on 06/12/2016 -----------------------------------------  Patient states she had improvement in nausea and discomfort after Phenergan and GI cocktail. Repeat abdominal exam is soft and benign. Do not feel that CT imaging clinically indicated at this time with a normal ultrasound. Discussed my concern that there is some component of gastritis. Discussed follow-up and we'll provide referral for GI. Patient is demonstrates understanding of signs and symptoms for which she should return to the Er.  Patient was able to tolerate PO and was able to ambulate with a steady gait.  Have discussed with the patient and available family all diagnostics and treatments performed thus far and all questions were answered to the best of my ability. The patient demonstrates understanding and agreement with plan.   ____________________________________________   FINAL CLINICAL IMPRESSION(S) / ED DIAGNOSES  Final diagnoses:  RUQ pain  Acute epigastric pain      NEW MEDICATIONS STARTED DURING THIS VISIT:  New Prescriptions   DICYCLOMINE (BENTYL) 10 MG CAPSULE    Take 1 capsule (10 mg total) by mouth 3 (three) times daily as needed for spasms.   PANTOPRAZOLE (PROTONIX) 40 MG TABLET    Take 1 tablet (40 mg total) by mouth daily.   PROMETHAZINE (PHENERGAN) 12.5 MG TABLET    Take 1 tablet (12.5 mg total) by mouth every 6 (six) hours as needed for nausea or vomiting.     Note:  This document was prepared using Dragon voice recognition software and may include unintentional dictation errors.    Merlyn Lot, MD 06/12/16  [phone]

## 2016-06-14 ENCOUNTER — Other Ambulatory Visit: Payer: Self-pay | Admitting: *Deleted

## 2016-06-14 ENCOUNTER — Ambulatory Visit (INDEPENDENT_AMBULATORY_CARE_PROVIDER_SITE_OTHER): Payer: 59 | Admitting: Gastroenterology

## 2016-06-14 ENCOUNTER — Encounter: Payer: Self-pay | Admitting: Gastroenterology

## 2016-06-14 VITALS — BP 118/74 | HR 85 | Temp 98.2°F | Ht 67.0 in | Wt 161.0 lb

## 2016-06-14 DIAGNOSIS — R1011 Right upper quadrant pain: Secondary | ICD-10-CM

## 2016-06-14 DIAGNOSIS — R1013 Epigastric pain: Secondary | ICD-10-CM

## 2016-06-14 DIAGNOSIS — G8929 Other chronic pain: Secondary | ICD-10-CM

## 2016-06-14 NOTE — Progress Notes (Addendum)
Gastroenterology Consultation  Referring Provider:     Dr. Merlyn Lot Primary Care Physician:  No PCP Per Patient Primary Gastroenterologist:  Dr. Allen Norris     Reason for Consultation:     Epigastric pain        HPI:   Rebecca Evans is a 31 y.o. y/o female referred for consultation & management of Epigastric pain by Dr. Rayne Du PCP Per Patient.  This patient comes in today with a report of epigastric discomfort which she also states is in the right upper quadrant. The patient had a visit to urgent care and they sent her to the emergency room. The patient had an ultrasound at that time that did not show any cause for her abdominal pain. The patient also had labwork that just showed an elevated white cell count which she had similar findings in year ago. The patient states she does not follow up with primary care provider. The patient reports that the abdominal pain is made worse by eating and feels better when she does not eat. She also reports that her symptoms are a little better since being discharged from the ER because either her Protonix is working which she was given in the ER or because she is less fatty foods. She denies any unexplained weight loss fevers chills, black stools or bloody stools. The patient was started on dicyclomine which she states does not help her and she is also on Protonix from the ER.  Past Medical History:  Diagnosis Date  . Anxiety   . Herpes genitalis in women   . Low-lying placenta     Past Surgical History:  Procedure Laterality Date  . LAPROSCOPY      Prior to Admission medications   Medication Sig Start Date End Date Taking? Authorizing Provider  cholecalciferol (VITAMIN D) 400 UNITS TABS tablet Take 400 Units by mouth. Reported on 11/29/2015   Yes Historical Provider, MD  dicyclomine (BENTYL) 10 MG capsule Take 1 capsule (10 mg total) by mouth 3 (three) times daily as needed for spasms. 06/12/16 06/26/16 Yes Merlyn Lot, MD  etonogestrel  (NEXPLANON) 68 MG IMPL implant 1 each (68 mg total) by Subdermal route once. 03/23/15  Yes Melody N Shambley, CNM  nitrofurantoin, macrocrystal-monohydrate, (MACROBID) 100 MG capsule Take 1 capsule (100 mg total) by mouth 2 (two) times daily. 01/02/16  Yes Versie Starks, PA-C  pantoprazole (PROTONIX) 40 MG tablet Take 1 tablet (40 mg total) by mouth daily. 06/12/16 06/12/17 Yes Merlyn Lot, MD  promethazine (PHENERGAN) 12.5 MG tablet Take 1 tablet (12.5 mg total) by mouth every 6 (six) hours as needed for nausea or vomiting. 06/12/16  Yes Merlyn Lot, MD    Family History  Problem Relation Age of Onset  . Diabetes Maternal Grandmother   . Diabetes Maternal Grandfather      Social History  Substance Use Topics  . Smoking status: Never Smoker  . Smokeless tobacco: Never Used  . Alcohol use No    Allergies as of 06/14/2016  . (No Known Allergies)    Review of Systems:    All systems reviewed and negative except where noted in HPI.   Physical Exam:  BP 118/74   Pulse 85   Temp 98.2 F (36.8 C)   Ht 5\' 7"  (1.702 m)   Wt 161 lb (73 kg)   BMI 25.22 kg/m  No LMP recorded. Psych:  Alert and cooperative. Normal mood and affect. General:   Alert,  Well-developed, well-nourished, pleasant and  cooperative in NAD Head:  Normocephalic and atraumatic. Eyes:  Sclera clear, no icterus.   Conjunctiva pink. Ears:  Normal auditory acuity. Nose:  No deformity, discharge, or lesions. Mouth:  No deformity or lesions,oropharynx pink & moist. Neck:  Supple; no masses or thyromegaly. Lungs:  Respirations even and unlabored.  Clear throughout to auscultation.   No wheezes, crackles, or rhonchi. No acute distress. Heart:  Regular rate and rhythm; no murmurs, clicks, rubs, or gallops. Abdomen:  Normal bowel sounds.  No bruits.  Soft, positive tenderness in the epigastric area and right upper quadrant. and non-distended without masses, hepatosplenomegaly or hernias noted.  No guarding or rebound  tenderness.  Negative Carnett sign.   Rectal:  Deferred.  Msk:  Symmetrical without gross deformities.  Good, equal movement & strength bilaterally. Pulses:  Normal pulses noted. Extremities:  No clubbing or edema.  No cyanosis. Neurologic:  Alert and oriented x3;  grossly normal neurologically. Skin:  Intact without significant lesions or rashes.  No jaundice. Lymph Nodes:  No significant cervical adenopathy. Psych:  Alert and cooperative. Normal mood and affect.  Imaging Studies: Dg Chest 2 View  Result Date: 06/12/2016 CLINICAL DATA:  31 y/o F; sharp pains in the diaphragm and chest tightness for 2 days. EXAM: CHEST  2 VIEW COMPARISON:  02/03/2014 chest radiograph FINDINGS: The heart size and mediastinal contours are within normal limits and stable. Both lungs are clear. The visualized skeletal structures are unremarkable. IMPRESSION: No active cardiopulmonary disease. Electronically Signed   By: Kristine Garbe M.D.   On: 06/12/2016 15:22   US Abdomen Limited Ruq  Result Date: 06/12/2016 CLINICAL DATA:  Right upper quadrant abdominal pain for 2 days. EXAM: US ABDOMEN LIMITED - RIGHT UPPER QUADRANT COMPARISON:  None. FINDINGS: Gallbladder: No gallstones or wall thickening visualized. No sonographic Murphy sign noted by sonographer. Common bile duct: Diameter: 4.0 mm Liver: Normal echogenicity without focal lesion or biliary dilatation. IMPRESSION: Normal right upper quadrant ultrasound examination. Electronically Signed   By: Marijo Sanes M.D.   On: 06/12/2016 18:23    Assessment and Plan:   Rebecca Evans is a 31 y.o. y/o female who comes in today with tenderness in the epigastric area and right upper quadrant. The patient had a negative ultrasound of the gallbladder but her symptoms are worse with fatty foods or greasy foods or she will be set up for a gallbladder emptying study. The patient's epigastric pain and symptoms of reflux will be investigated with an upper  endoscopy. The patient has been told to stop the dicyclomine cause she says it is not helping her. The patient will also continue the Protonix. I have discussed risks & benefits which include, but are not limited to, bleeding, infection, perforation & drug reaction.  The patient agrees with this plan & written consent will be obtained.       Lucilla Lame, MD. Marval Regal   Note: This dictation was prepared with Dragon dictation along with smaller phrase technology. Any transcriptional errors that result from this process are unintentional.

## 2016-06-15 ENCOUNTER — Other Ambulatory Visit: Payer: Self-pay

## 2016-06-15 DIAGNOSIS — Z01812 Encounter for preprocedural laboratory examination: Secondary | ICD-10-CM

## 2016-06-18 ENCOUNTER — Encounter
Admission: RE | Admit: 2016-06-18 | Discharge: 2016-06-18 | Disposition: A | Discharge status: Home / Self Care | Payer: 59 | Source: Ambulatory Visit | Attending: Gastroenterology | Admitting: Gastroenterology

## 2016-06-18 ENCOUNTER — Telehealth: Payer: Self-pay | Admitting: Gastroenterology

## 2016-06-18 ENCOUNTER — Other Ambulatory Visit
Admission: RE | Admit: 2016-06-18 | Discharge: 2016-06-18 | Disposition: A | Discharge status: Home / Self Care | Payer: 59 | Source: Ambulatory Visit | Attending: Gastroenterology | Admitting: Gastroenterology

## 2016-06-18 DIAGNOSIS — R110 Nausea: Secondary | ICD-10-CM | POA: Diagnosis not present

## 2016-06-18 DIAGNOSIS — G8929 Other chronic pain: Secondary | ICD-10-CM | POA: Insufficient documentation

## 2016-06-18 DIAGNOSIS — R1013 Epigastric pain: Secondary | ICD-10-CM | POA: Diagnosis not present

## 2016-06-18 LAB — PREGNANCY, URINE: PREG TEST UR: NEGATIVE

## 2016-06-18 MED ORDER — TECHNETIUM TC 99M MEBROFENIN IV KIT
5.0000 | PACK | Freq: Once | INTRAVENOUS | Status: AC | PRN
  Administered 2016-06-18: 4.925 via INTRAVENOUS

## 2016-06-18 NOTE — Telephone Encounter (Signed)
Pt notified HIDA scan and [place] were normal.

## 2016-06-18 NOTE — Telephone Encounter (Signed)
Patient would like to know if the results are back from her gallbladder scan. She just doesn't want to have the colonoscopy tomorrow if it's her gallbladder. Please call today

## 2016-06-19 ENCOUNTER — Ambulatory Visit: Payer: 59 | Admitting: Anesthesiology

## 2016-06-19 ENCOUNTER — Encounter: Payer: Self-pay | Admitting: Anesthesiology

## 2016-06-19 ENCOUNTER — Ambulatory Visit
Admission: RE | Admit: 2016-06-19 | Discharge: 2016-06-19 | Disposition: A | Discharge status: Home / Self Care | Payer: 59 | Source: Ambulatory Visit | Attending: Gastroenterology | Admitting: Gastroenterology

## 2016-06-19 ENCOUNTER — Encounter
Admission: RE | Disposition: A | Discharge status: Home / Self Care | Payer: Self-pay | Source: Ambulatory Visit | Attending: Gastroenterology

## 2016-06-19 DIAGNOSIS — K210 Gastro-esophageal reflux disease with esophagitis: Secondary | ICD-10-CM | POA: Diagnosis not present

## 2016-06-19 DIAGNOSIS — Z793 Long term (current) use of hormonal contraceptives: Secondary | ICD-10-CM | POA: Insufficient documentation

## 2016-06-19 DIAGNOSIS — Z792 Long term (current) use of antibiotics: Secondary | ICD-10-CM | POA: Insufficient documentation

## 2016-06-19 DIAGNOSIS — K228 Other specified diseases of esophagus: Secondary | ICD-10-CM | POA: Diagnosis not present

## 2016-06-19 DIAGNOSIS — Z79899 Other long term (current) drug therapy: Secondary | ICD-10-CM | POA: Diagnosis not present

## 2016-06-19 DIAGNOSIS — K209 Esophagitis, unspecified: Secondary | ICD-10-CM | POA: Diagnosis not present

## 2016-06-19 DIAGNOSIS — R1013 Epigastric pain: Secondary | ICD-10-CM | POA: Diagnosis not present

## 2016-06-19 HISTORY — PX: ESOPHAGOGASTRODUODENOSCOPY (EGD) WITH PROPOFOL: SHX5813

## 2016-06-19 SURGERY — ESOPHAGOGASTRODUODENOSCOPY (EGD) WITH PROPOFOL
Anesthesia: General

## 2016-06-19 MED ORDER — MIDAZOLAM HCL 2 MG/2ML IJ SOLN
INTRAMUSCULAR | Status: AC
  Filled 2016-06-19: qty 2

## 2016-06-19 MED ORDER — LIDOCAINE HCL (PF) 1 % IJ SOLN
2.0000 mL | Freq: Once | INTRAMUSCULAR | Status: AC
Start: 2016-06-19 — End: 2016-06-19
  Administered 2016-06-19: 0.03 mL via INTRADERMAL

## 2016-06-19 MED ORDER — PROPOFOL 500 MG/50ML IV EMUL
INTRAVENOUS | Status: AC
  Filled 2016-06-19: qty 50

## 2016-06-19 MED ORDER — LIDOCAINE HCL (PF) 2 % IJ SOLN
INTRAMUSCULAR | Status: DC | PRN
  Administered 2016-06-19: 80 mg via INTRADERMAL

## 2016-06-19 MED ORDER — PROPOFOL 10 MG/ML IV BOLUS
INTRAVENOUS | Status: DC | PRN
  Administered 2016-06-19: 100 mg via INTRAVENOUS

## 2016-06-19 MED ORDER — LIDOCAINE HCL (PF) 1 % IJ SOLN
INTRAMUSCULAR | Status: AC
  Administered 2016-06-19: 0.03 mL via INTRADERMAL
  Filled 2016-06-19: qty 2

## 2016-06-19 MED ORDER — SODIUM CHLORIDE 0.9 % IV SOLN
INTRAVENOUS | Status: DC
  Administered 2016-06-19: 1000 mL via INTRAVENOUS

## 2016-06-19 MED ORDER — PROPOFOL 500 MG/50ML IV EMUL
INTRAVENOUS | Status: DC | PRN
  Administered 2016-06-19: 150 ug/kg/min via INTRAVENOUS

## 2016-06-19 MED ORDER — MIDAZOLAM HCL 2 MG/2ML IJ SOLN
INTRAMUSCULAR | Status: DC | PRN
Start: 2016-06-19 — End: 2016-06-19
  Administered 2016-06-19: 1 mg via INTRAVENOUS

## 2016-06-19 NOTE — Anesthesia Postprocedure Evaluation (Signed)
Anesthesia Post Note  Patient: Rebecca Evans  Procedure(s) Performed: Procedure(s) (LRB): ESOPHAGOGASTRODUODENOSCOPY (EGD) WITH PROPOFOL (N/A)  Patient location during evaluation: Endoscopy Anesthesia Type: General Level of consciousness: awake and alert Pain management: pain level controlled Vital Signs Assessment: post-procedure vital signs reviewed and stable Respiratory status: spontaneous breathing, nonlabored ventilation, respiratory function stable and patient connected to nasal cannula oxygen Cardiovascular status: blood pressure returned to baseline and stable Postop Assessment: no signs of nausea or vomiting Anesthetic complications: no     Last Vitals:  Vitals:   06/19/16 0905 06/19/16 0915  BP: 106/76 110/73  Pulse: 79 73  Resp: 20 16  Temp:      Last Pain:  Vitals:   06/19/16 0835  TempSrc: Tympanic  PainSc: Asleep                 Rebecca Evans

## 2016-06-19 NOTE — Anesthesia Preprocedure Evaluation (Signed)
Anesthesia Evaluation  Patient identified by MRN, date of birth, ID band Patient awake    Reviewed: Allergy & Precautions, H&P , NPO status , Patient's Chart, lab work & pertinent test results  Airway Mallampati: II  TM Distance: >3 FB Neck ROM: full    Dental  (+) Poor Dentition, Chipped   Pulmonary neg pulmonary ROS, neg shortness of breath,    Pulmonary exam normal breath sounds clear to auscultation       Cardiovascular Exercise Tolerance: Good (-) angina(-) Past MI and (-) DOE negative cardio ROS Normal cardiovascular exam Rhythm:regular Rate:Normal     Neuro/Psych negative neurological ROS  negative psych ROS   GI/Hepatic Neg liver ROS, GERD  Controlled,  Endo/Other  negative endocrine ROS  Renal/GU negative Renal ROS  negative genitourinary   Musculoskeletal   Abdominal   Peds  Hematology negative hematology ROS (+)   Anesthesia Other Findings Past Medical History: No date: Anxiety No date: Herpes genitalis in women No date: Low-lying placenta  Past Surgical History: No date: LAPROSCOPY     Reproductive/Obstetrics negative OB ROS                             Anesthesia Physical Anesthesia Plan  ASA: III  Anesthesia Plan: General   Post-op Pain Management:    Induction:   Airway Management Planned:   Additional Equipment:   Intra-op Plan:   Post-operative Plan:   Informed Consent: I have reviewed the patients History and Physical, chart, labs and discussed the procedure including the risks, benefits and alternatives for the proposed anesthesia with the patient or authorized representative who has indicated his/her understanding and acceptance.   Dental Advisory Given  Plan Discussed with: Anesthesiologist, CRNA and Surgeon  Anesthesia Plan Comments:         Anesthesia Quick Evaluation

## 2016-06-19 NOTE — Op Note (Signed)
[facility] Gastroenterology Patient Name: Rebecca Evans Procedure Date: 06/19/2016 8:22 AM MRN: [id number] Account #: [id number] Date of Birth: 10/02/1985 Admit Type: Outpatient Age: 31 Room: [facility] ENDO ROOM 4 Gender: Female Note Status: Finalized Procedure:            Upper GI endoscopy Indications:          Epigastric abdominal pain Providers:            Lucilla Lame MD, MD Referring MD:         No Local Md, MD (Referring MD) Medicines:            Propofol per Anesthesia Complications:        No immediate complications. Procedure:            Pre-Anesthesia Assessment:                       - Prior to the procedure, a History and Physical was                        performed, and patient medications and allergies were                        reviewed. The patient's tolerance of previous                        anesthesia was also reviewed. The risks and benefits of                        the procedure and the sedation options and risks were                        discussed with the patient. All questions were                        answered, and informed consent was obtained. Prior                        Anticoagulants: The patient has taken no previous                        anticoagulant or antiplatelet agents. ASA Grade                        Assessment: II - A patient with mild systemic disease.                        After reviewing the risks and benefits, the patient was                        deemed in satisfactory condition to undergo the                        procedure.                       After obtaining informed consent, the endoscope was                        passed under direct vision. Throughout the procedure,  the patient's blood pressure, pulse, and oxygen                        saturations were monitored continuously. The Endoscope                        was introduced through the mouth, and advanced to the          second part of duodenum. The upper GI endoscopy was                        accomplished without difficulty. The patient tolerated                        the procedure well. Findings:      LA Grade A (one or more mucosal breaks less than 5 mm, not extending       between tops of 2 mucosal folds) esophagitis with no bleeding was found       at the gastroesophageal junction.      The Z-line was irregular and was found at the gastroesophageal junction.      The stomach was normal.      The examined duodenum was normal. Impression:           - LA Grade A reflux esophagitis.                       - Z-line irregular, at the gastroesophageal junction.                       - Normal stomach.                       - Normal examined duodenum.                       - No specimens collected. Recommendation:       - Discharge patient to home.                       - Resume previous diet.                       - Continue present medications. Procedure Code(s):    --- Professional ---                       [phone], Esophagogastroduodenoscopy, flexible, transoral;                        diagnostic, including collection of specimen(s) by                        brushing or washing, when performed (separate procedure) Diagnosis Code(s):    --- Professional ---                       R10.13, Epigastric pain CPT copyright 2016 [employer]. All rights reserved. The codes documented in this report are preliminary and upon coder review may  be revised to meet current compliance requirements. Lucilla Lame MD, MD 06/19/2016 8:33:03 AM This report has been signed electronically. Number of Addenda: 0 Note Initiated On: 06/19/2016 8:22 AM      [facility]

## 2016-06-19 NOTE — H&P (Signed)
  Lucilla Lame, MD [address] [place],  [address] Phone: [phone] Fax : [phone]  Primary Care Physician:  No PCP Per Patient Primary Gastroenterologist:  Dr. Allen Norris  Pre-Procedure History & Physical: HPI:  Rebecca Evans is a 31 y.o. female is here for an endoscopy.   Past Medical History:  Diagnosis Date  . Anxiety   . Herpes genitalis in women   . Low-lying placenta     Past Surgical History:  Procedure Laterality Date  . LAPROSCOPY      Prior to Admission medications   Medication Sig Start Date End Date Taking? Authorizing Provider  cholecalciferol (VITAMIN D) 400 UNITS TABS tablet Take 400 Units by mouth. Reported on 11/29/2015    Historical Provider, MD  dicyclomine (BENTYL) 10 MG capsule Take 1 capsule (10 mg total) by mouth 3 (three) times daily as needed for spasms. 06/12/16 06/26/16  Merlyn Lot, MD  etonogestrel (NEXPLANON) 68 MG IMPL implant 1 each (68 mg total) by Subdermal route once. 03/23/15   Melody N Shambley, CNM  nitrofurantoin, macrocrystal-monohydrate, (MACROBID) 100 MG capsule Take 1 capsule (100 mg total) by mouth 2 (two) times daily. 01/02/16   Versie Starks, PA-C  pantoprazole (PROTONIX) 40 MG tablet Take 1 tablet (40 mg total) by mouth daily. 06/12/16 06/12/17  Merlyn Lot, MD  promethazine (PHENERGAN) 12.5 MG tablet Take 1 tablet (12.5 mg total) by mouth every 6 (six) hours as needed for nausea or vomiting. 06/12/16   Merlyn Lot, MD    Allergies as of 06/14/2016  . (No Known Allergies)    Family History  Problem Relation Age of Onset  . Diabetes Maternal Grandmother   . Diabetes Maternal Grandfather     Social History   Social History  . Marital status: Married    Spouse name: N/A  . Number of children: N/A  . Years of education: N/A   Occupational History  . Not on file.   Social History Main Topics  . Smoking status: Never Smoker  . Smokeless tobacco: Never Used  . Alcohol use No  . Drug  use: No  . Sexual activity: Not Currently    Birth control/ protection: None   Other Topics Concern  . Not on file   Social History Narrative  . No narrative on file    Review of Systems: See HPI, otherwise negative ROS  Physical Exam: Ht 5\' 7"  (1.702 m)   Wt 170 lb (77.1 kg)   BMI 26.63 kg/m  General:   Alert,  pleasant and cooperative in NAD Head:  Normocephalic and atraumatic. Neck:  Supple; no masses or thyromegaly. Lungs:  Clear throughout to auscultation.    Heart:  Regular rate and rhythm. Abdomen:  Soft, nontender and nondistended. Normal bowel sounds, without guarding, and without rebound.   Neurologic:  Alert and  oriented x4;  grossly normal neurologically.  Impression/Plan: Rebecca Evans is here for an endoscopy to be performed for epigastric pain  Risks, benefits, limitations, and alternatives regarding  endoscopy have been reviewed with the patient.  Questions have been answered.  All parties agreeable.   Lucilla Lame, MD  06/19/2016, 8:15 AM

## 2016-06-19 NOTE — Transfer of Care (Signed)
Immediate Anesthesia Transfer of Care Note  Patient: Rebecca Evans  Procedure(s) Performed: Procedure(s): ESOPHAGOGASTRODUODENOSCOPY (EGD) WITH PROPOFOL (N/A)  Patient Location: PACU  Anesthesia Type:General  Level of Consciousness: sedated  Airway & Oxygen Therapy: Patient Spontanous Breathing and Patient connected to nasal cannula oxygen  Post-op Assessment: Report given to RN and Post -op Vital signs reviewed and stable  Post vital signs: Reviewed and stable  Last Vitals:  Vitals:   06/19/16 0820 06/19/16 0835  BP: 112/71 97/68  Pulse: 82 74  Resp: 16 18  Temp: (!) 35.7 C 36.1 C    Last Pain:  Vitals:   06/19/16 0835  TempSrc: Tympanic  PainSc: Asleep         Complications: No apparent anesthesia complications

## 2016-06-19 NOTE — Anesthesia Procedure Notes (Signed)
Date/Time: 06/19/2016 8:25 AM Performed by: Hedda Slade Pre-anesthesia Checklist: Patient identified, Emergency Drugs available, Suction available and Patient being monitored Oxygen Delivery Method: Nasal cannula

## 2016-06-22 ENCOUNTER — Encounter: Payer: Self-pay | Admitting: Gastroenterology

## 2016-06-26 ENCOUNTER — Ambulatory Visit: Payer: 59 | Admitting: Gastroenterology

## 2016-09-28 ENCOUNTER — Ambulatory Visit: Payer: Self-pay | Admitting: Physician Assistant

## 2016-09-28 VITALS — BP 110/70 | HR 94 | Temp 98.2°F

## 2016-09-28 DIAGNOSIS — N390 Urinary tract infection, site not specified: Secondary | ICD-10-CM

## 2016-09-28 DIAGNOSIS — R319 Hematuria, unspecified: Secondary | ICD-10-CM

## 2016-09-28 DIAGNOSIS — R300 Dysuria: Secondary | ICD-10-CM

## 2016-09-28 DIAGNOSIS — R12 Heartburn: Secondary | ICD-10-CM

## 2016-09-28 LAB — POCT URINALYSIS DIPSTICK
Bilirubin, UA: NEGATIVE
GLUCOSE UA: NEGATIVE
KETONES UA: NEGATIVE
Nitrite, UA: NEGATIVE
PROTEIN UA: NEGATIVE
SPEC GRAV UA: 1.02 (ref 1.010–1.025)
UROBILINOGEN UA: 0.2 U/dL
pH, UA: 6 (ref 5.0–8.0)

## 2016-09-28 MED ORDER — PHENAZOPYRIDINE HCL 200 MG PO TABS: 200.0000 mg | ORAL_TABLET | Freq: Three times a day (TID) | ORAL | 0 refills | Status: DC

## 2016-09-28 MED ORDER — SULFAMETHOXAZOLE-TRIMETHOPRIM 800-160 MG PO TABS: 1.0000 | ORAL_TABLET | Freq: Two times a day (BID) | ORAL | 0 refills | Status: DC

## 2016-09-28 MED ORDER — FLUCONAZOLE 150 MG PO TABS: ORAL_TABLET | ORAL | 0 refills | Status: DC

## 2016-09-28 MED ORDER — PANTOPRAZOLE SODIUM 40 MG PO TBEC: 40.0000 mg | DELAYED_RELEASE_TABLET | Freq: Every day | ORAL | 3 refills | Status: DC

## 2016-09-28 NOTE — Patient Instructions (Addendum)
Follow up with PCM in two weeks for repeat urinalysis Follow up in 1 year for magnesium level as protonix can lower blood levels Weight loss, avoid trigger foods, meals late at night. protonix 40mg  by mouth daily Bactrim DS by mouth twice a day x 1 week Pyridium 200mg  by mouth every 8 hours x 2 days Hydrate Avoid holding urine Take diflucan 150mg  by mouth at onset vaginal yeast infections and may repeat once in 72 hours if needed  Gastroesophageal Reflux Disease, Adult Normally, food travels down the esophagus and stays in the stomach to be digested. However, when a person has gastroesophageal reflux disease (GERD), food and stomach acid move back up into the esophagus. When this happens, the esophagus becomes sore and inflamed. Over time, GERD can create small holes (ulcers) in the lining of the esophagus. What are the causes? This condition is caused by a problem with the muscle between the esophagus and the stomach (lower esophageal sphincter, or LES). Normally, the LES muscle closes after food passes through the esophagus to the stomach. When the LES is weakened or abnormal, it does not close properly, and that allows food and stomach acid to go back up into the esophagus. The LES can be weakened by certain dietary substances, medicines, and medical conditions, including:  Tobacco use.  Pregnancy.  Having a hiatal hernia.  Heavy alcohol use.  Certain foods and beverages, such as coffee, chocolate, onions, and peppermint. What increases the risk? This condition is more likely to develop in:  People who have an increased body weight.  People who have connective tissue disorders.  People who use NSAID medicines. What are the signs or symptoms? Symptoms of this condition include:  Heartburn.  Difficult or painful swallowing.  The feeling of having a lump in the throat.  Abitter taste in the mouth.  Bad breath.  Having a large amount of saliva.  Having an upset or  bloated stomach.  Belching.  Chest pain.  Shortness of breath or wheezing.  Ongoing (chronic) cough or a night-time cough.  Wearing away of tooth enamel.  Weight loss. Different conditions can cause chest pain. Make sure to see your health care provider if you experience chest pain. How is this diagnosed? Your health care provider will take a medical history and perform a physical exam. To determine if you have mild or severe GERD, your health care provider may also monitor how you respond to treatment. You may also have other tests, including:  An endoscopy toexamine your stomach and esophagus with a small camera.  A test thatmeasures the acidity level in your esophagus.  A test thatmeasures how much pressure is on your esophagus.  A barium swallow or modified barium swallow to show the shape, size, and functioning of your esophagus. How is this treated? The goal of treatment is to help relieve your symptoms and to prevent complications. Treatment for this condition may vary depending on how severe your symptoms are. Your health care provider may recommend:  Changes to your diet.  Medicine.  Surgery. Follow these instructions at home: Diet   Follow a diet as recommended by your health care provider. This may involve avoiding foods and drinks such as:  Coffee and tea (with or without caffeine).  Drinks that containalcohol.  Energy drinks and sports drinks.  Carbonated drinks or sodas.  Chocolate and cocoa.  Peppermint and mint flavorings.  Garlic and onions.  Horseradish.  Spicy and acidic foods, including peppers, chili powder, curry powder, vinegar, hot  sauces, and barbecue sauce.  Citrus fruit juices and citrus fruits, such as oranges, lemons, and limes.  Tomato-based foods, such as red sauce, chili, salsa, and pizza with red sauce.  Fried and fatty foods, such as donuts, french fries, potato chips, and high-fat dressings.  High-fat meats, such as  hot dogs and fatty cuts of red and white meats, such as rib eye steak, sausage, ham, and bacon.  High-fat dairy items, such as whole milk, butter, and cream cheese.  Eat small, frequent meals instead of large meals.  Avoid drinking large amounts of liquid with your meals.  Avoid eating meals during the 2-3 hours before bedtime.  Avoid lying down right after you eat.  Do not exercise right after you eat. General instructions   Pay attention to any changes in your symptoms.  Take over-the-counter and prescription medicines only as told by your health care provider. Do not take aspirin, ibuprofen, or other NSAIDs unless your health care provider told you to do so.  Do not use any tobacco products, including cigarettes, chewing tobacco, and e-cigarettes. If you need help quitting, ask your health care provider.  Wear loose-fitting clothing. Do not wear anything tight around your waist that causes pressure on your abdomen.  Raise (elevate) the head of your bed 6 inches (15cm).  Try to reduce your stress, such as with yoga or meditation. If you need help reducing stress, ask your health care provider.  If you are overweight, reduce your weight to an amount that is healthy for you. Ask your health care provider for guidance about a safe weight loss goal.  Keep all follow-up visits as told by your health care provider. This is important. Contact a health care provider if:  You have new symptoms.  You have unexplained weight loss.  You have difficulty swallowing, or it hurts to swallow.  You have wheezing or a persistent cough.  Your symptoms do not improve with treatment.  You have a hoarse voice. Get help right away if:  You have pain in your arms, neck, jaw, teeth, or back.  You feel sweaty, dizzy, or light-headed.  You have chest pain or shortness of breath.  You vomit and your vomit looks like blood or coffee grounds.  You faint.  Your stool is bloody or  black.  You cannot swallow, drink, or eat. This information is not intended to replace advice given to you by your health care provider. Make sure you discuss any questions you have with your health care provider. Document Released: 02/28/2005 Document Revised: 10/19/2015 Document Reviewed: 09/15/2014 Elsevier Interactive Patient Education  2017 [employer].  Urinary Tract Infection, Adult A urinary tract infection (UTI) is an infection of any part of the urinary tract, which includes the kidneys, ureters, bladder, and urethra. These organs make, store, and get rid of urine in the body. UTI can be a bladder infection (cystitis) or kidney infection (pyelonephritis). What are the causes? This infection may be caused by fungi, viruses, or bacteria. Bacteria are the most common cause of UTIs. This condition can also be caused by repeated incomplete emptying of the bladder during urination. What increases the risk? This condition is more likely to develop if:  You ignore your need to urinate or hold urine for long periods of time.  You do not empty your bladder completely during urination.  You wipe back to front after urinating or having a bowel movement, if you are female.  You are uncircumcised, if you are female.  You are constipated.  You have a urinary catheter that stays in place (indwelling).  You have a weak defense (immune) system.  You have a medical condition that affects your bowels, kidneys, or bladder.  You have diabetes.  You take antibiotic medicines frequently or for long periods of time, and the antibiotics no longer work well against certain types of infections (antibiotic resistance).  You take medicines that irritate your urinary tract.  You are exposed to chemicals that irritate your urinary tract.  You are female. What are the signs or symptoms? Symptoms of this condition include:  Fever.  Frequent urination or passing small amounts of urine  frequently.  Needing to urinate urgently.  Pain or burning with urination.  Urine that smells bad or unusual.  Cloudy urine.  Pain in the lower abdomen or back.  Trouble urinating.  Blood in the urine.  Vomiting or being less hungry than normal.  Diarrhea or abdominal pain.  Vaginal discharge, if you are female. How is this diagnosed? This condition is diagnosed with a medical history and physical exam. You will also need to provide a urine sample to test your urine. Other tests may be done, including:  Blood tests.  Sexually transmitted disease (STD) testing. If you have had more than one UTI, a cystoscopy or imaging studies may be done to determine the cause of the infections. How is this treated? Treatment for this condition often includes a combination of two or more of the following:  Antibiotic medicine.  Other medicines to treat less common causes of UTI.  Over-the-counter medicines to treat pain.  Drinking enough water to stay hydrated. Follow these instructions at home:  Take over-the-counter and prescription medicines only as told by your health care provider.  If you were prescribed an antibiotic, take it as told by your health care provider. Do not stop taking the antibiotic even if you start to feel better.  Avoid alcohol, caffeine, tea, and carbonated beverages. They can irritate your bladder.  Drink enough fluid to keep your urine clear or pale yellow.  Keep all follow-up visits as told by your health care provider. This is important.  Make sure to:  Empty your bladder often and completely. Do not hold urine for long periods of time.  Empty your bladder before and after sex.  Wipe from front to back after a bowel movement if you are female. Use each tissue one time when you wipe. Contact a health care provider if:  You have back pain.  You have a fever.  You feel nauseous or vomit.  Your symptoms do not get better after 3 days.  Your  symptoms go away and then return. Get help right away if:  You have severe back pain or lower abdominal pain.  You are vomiting and cannot keep down any medicines or water. This information is not intended to replace advice given to you by your health care provider. Make sure you discuss any questions you have with your health care provider. Document Released: 02/28/2005 Document Revised: 11/02/2015 Document Reviewed: 04/11/2015 Elsevier Interactive Patient Education  2017 [employer]  Vaginal Yeast infection, Adult Vaginal yeast infection is a condition that causes soreness, swelling, and redness (inflammation) of the vagina. It also causes vaginal discharge. This is a common condition. Some women get this infection frequently. What are the causes? This condition is caused by a change in the normal balance of the yeast (candida) and bacteria that live in the vagina. This change causes an  overgrowth of yeast, which causes the inflammation. What increases the risk? This condition is more likely to develop in:  Women who take antibiotic medicines.  Women who have diabetes.  Women who take birth control pills.  Women who are pregnant.  Women who douche often.  Women who have a weak defense (immune) system.  Women who have been taking steroid medicines for a long time.  Women who frequently wear tight clothing. What are the signs or symptoms? Symptoms of this condition include:  White, thick vaginal discharge.  Swelling, itching, redness, and irritation of the vagina. The lips of the vagina (vulva) may be affected as well.  Pain or a burning feeling while urinating.  Pain during sex. How is this diagnosed? This condition is diagnosed with a medical history and physical exam. This will include a pelvic exam. Your health care provider will examine a sample of your vaginal discharge under a microscope. Your health care provider may send this sample for testing to confirm the  diagnosis. How is this treated? This condition is treated with medicine. Medicines may be over-the-counter or prescription. You may be told to use one or more of the following:  Medicine that is taken orally.  Medicine that is applied as a cream.  Medicine that is inserted directly into the vagina (suppository). Follow these instructions at home:  Take or apply over-the-counter and prescription medicines only as told by your health care provider.  Do not have sex until your health care provider has approved. Tell your sex partner that you have a yeast infection. That person should go to his or her health care provider if he or she develops symptoms.  Do not wear tight clothes, such as pantyhose or tight pants.  Avoid using tampons until your health care provider approves.  Eat more yogurt. This may help to keep your yeast infection from returning.  Try taking a sitz bath to help with discomfort. This is a warm water bath that is taken while you are sitting down. The water should only come up to your hips and should cover your buttocks. Do this 3-4 times per day or as told by your health care provider.  Do not douche.  Wear breathable, cotton underwear.  If you have diabetes, keep your blood sugar levels under control. Contact a health care provider if:  You have a fever.  Your symptoms go away and then return.  Your symptoms do not get better with treatment.  Your symptoms get worse.  You have new symptoms.  You develop blisters in or around your vagina.  You have blood coming from your vagina and it is not your menstrual period.  You develop pain in your abdomen. This information is not intended to replace advice given to you by your health care provider. Make sure you discuss any questions you have with your health care provider. Document Released: 02/28/2005 Document Revised: 11/02/2015 Document Reviewed: 11/22/2014 Elsevier Interactive Patient Education  2017  [employer].

## 2016-09-28 NOTE — Progress Notes (Signed)
Subjective:    Patient ID: Rebecca Evans, female    DOB: December 25, 1985, 31 y.o.   MRN: [id number]  31y/o married caucasian female has had dysuria since Saturday 22 Sep 2016 history of recurrent UTIs was on macrobid daily during past two pregnancies and patient reported it would take progressively more days for symptoms to clear on macrobid.  Has had gross hematuria, back pain, burning, frequency, headache and dark cloudy urine.  Patient would also like refill on protonix Rx ran out having daily evening heartburn symptoms.  EGD Jan 2018 GI stated to continue protonix 40mg  po daily.  PMHx pregnancy x 2; nexplanon insertion left arm.  Has quit breastfeeding and no longer on prenatal vitamins.  PSHx EGD      Review of Systems  Constitutional: Negative for activity change, appetite change, chills, diaphoresis, fatigue and fever.  HENT: Negative for congestion, ear pain, sinus pain, sinus pressure and sore throat.   Eyes: Negative for pain and discharge.  Respiratory: Negative for cough, shortness of breath and wheezing.   Cardiovascular: Negative for chest pain, palpitations and leg swelling.  Gastrointestinal: Negative for abdominal distention, abdominal pain, blood in stool, constipation, diarrhea, nausea and vomiting.  Endocrine: Negative for cold intolerance and heat intolerance.  Genitourinary: Positive for dysuria, frequency, hematuria and urgency. Negative for decreased urine volume, difficulty urinating, dyspareunia, enuresis, flank pain, genital sores, menstrual problem, pelvic pain, vaginal bleeding, vaginal discharge and vaginal pain.  Musculoskeletal: Positive for back pain. Negative for arthralgias, gait problem, joint swelling, myalgias, neck pain and neck stiffness.  Skin: Negative for rash.  Allergic/Immunologic: Negative for environmental allergies and food allergies.  Neurological: Positive for headaches. Negative for dizziness, tremors, seizures, syncope, facial asymmetry,  speech difficulty, weakness, light-headedness and numbness.  Hematological: Negative for adenopathy. Does not bruise/bleed easily.  Psychiatric/Behavioral: Negative for agitation, confusion and sleep disturbance. The patient is not nervous/anxious.        Objective:   Physical Exam  Constitutional: She is oriented to person, place, and time. Vital signs are normal. She appears well-developed and well-nourished. She is active and cooperative.  Non-toxic appearance. She does not have a sickly appearance. She does not appear ill. No distress.  HENT:  Head: Normocephalic and atraumatic.  Right Ear: Hearing and external ear normal.  Left Ear: Hearing and external ear normal.  Nose: Mucosal edema and rhinorrhea present. No nose lacerations, sinus tenderness, nasal deformity, septal deviation or nasal septal hematoma. No epistaxis.  No foreign bodies. Right sinus exhibits no maxillary sinus tenderness and no frontal sinus tenderness. Left sinus exhibits no maxillary sinus tenderness and no frontal sinus tenderness.  Mouth/Throat: Uvula is midline and mucous membranes are normal. Mucous membranes are not pale, not dry and not cyanotic. She does not have dentures. No oral lesions. No trismus in the jaw. Normal dentition. No dental abscesses, uvula swelling, lacerations or dental caries. No oropharyngeal exudate, posterior oropharyngeal edema, posterior oropharyngeal erythema or tonsillar abscesses.  Eyes: Conjunctivae, EOM and lids are normal. Pupils are equal, round, and reactive to light. Right eye exhibits no chemosis, no discharge, no exudate and no hordeolum. No foreign body present in the right eye. Left eye exhibits no chemosis, no discharge, no exudate and no hordeolum. No foreign body present in the left eye. Right conjunctiva is not injected. Right conjunctiva has no hemorrhage. Left conjunctiva is not injected. Left conjunctiva has no hemorrhage. No scleral icterus. Right eye exhibits normal  extraocular motion and no nystagmus. Left eye exhibits normal extraocular  motion and no nystagmus. Right pupil is round and reactive. Left pupil is round and reactive. Pupils are equal.  Neck: Trachea normal, normal range of motion and phonation normal. Neck supple. No tracheal tenderness, no spinous process tenderness and no muscular tenderness present. No neck rigidity. No tracheal deviation, no edema, no erythema and normal range of motion present. No thyroid mass and no thyromegaly present.  Cardiovascular: Normal rate, regular rhythm, S1 normal, S2 normal, normal heart sounds and intact distal pulses.  PMI is not displaced.  Exam reveals no gallop and no friction rub.   No murmur heard. Pulmonary/Chest: Effort normal and breath sounds normal. No accessory muscle usage or stridor. No respiratory distress. She has no decreased breath sounds. She has no wheezes. She has no rhonchi. She has no rales. She exhibits no tenderness.  Abdominal: Soft. She exhibits no shifting dullness, no distension, no pulsatile liver, no fluid wave, no abdominal bruit, no ascites, no pulsatile midline mass and no mass. Bowel sounds are decreased. There is no hepatosplenomegaly. There is tenderness in the suprapubic area. There is no rigidity, no rebound, no guarding, no CVA tenderness, no tenderness at McBurney's point and negative Murphy's sign. Hernia confirmed negative in the ventral area.    Dull to percussion x 4 quads; on/off exam table without difficulty; hypoactive bowel sounds x 4 quads  Musculoskeletal: Normal range of motion. She exhibits no edema, tenderness or deformity.       Right shoulder: Normal.       Left shoulder: Normal.       Right hip: Normal.       Left hip: Normal.       Right knee: Normal.       Left knee: Normal.       Cervical back: Normal.       Thoracic back: Normal.       Lumbar back: She exhibits pain. She exhibits normal range of motion, no tenderness, no bony tenderness, no swelling,  no edema, no deformity, no laceration, no spasm and normal pulse.       Back:       Right hand: Normal.       Left hand: Normal.  Lymphadenopathy:       Head (right side): No submental, no submandibular, no tonsillar, no preauricular, no posterior auricular and no occipital adenopathy present.       Head (left side): No submental, no submandibular, no tonsillar, no preauricular, no posterior auricular and no occipital adenopathy present.    She has no cervical adenopathy.       Right cervical: No superficial cervical, no deep cervical and no posterior cervical adenopathy present.      Left cervical: No superficial cervical, no deep cervical and no posterior cervical adenopathy present.  Neurological: She is alert and oriented to person, place, and time. She has normal strength. She is not disoriented. She displays no atrophy and no tremor. No cranial nerve deficit or sensory deficit. She exhibits normal muscle tone. She displays no seizure activity. Coordination and gait normal. GCS eye subscore is 4. GCS verbal subscore is 5. GCS motor subscore is 6.  Gait sure and steady in hall  Skin: Skin is warm, dry and intact. No abrasion, no bruising, no burn, no ecchymosis, no laceration, no lesion, no petechiae and no rash noted. She is not diaphoretic. No cyanosis or erythema. No pallor. Nails show no clubbing.  Psychiatric: She has a normal mood and affect. Her speech is normal and  behavior is normal. Judgment and thought content normal. Cognition and memory are normal.  Nursing note and vitals reviewed.    Reviewed previous lab results urine culture minimal growth/negative no history of antibiotic resistance x 2 years.     Assessment & Plan:  A- UTI acute with hematuria; chronic heartburn;   P-Medications as directed. Bactrim DS po BID x 7 days #14 RF0 follow up if symptoms not improving with 48 hours of use, unable to void every 8 hours, worsening back pain, fever greater than 100.5.  Patient  is also to push fluids and may use Pyridium 200mg  po TID x 48 hours as needed.  Hydrate, avoid dehydration.  Avoid holding urine void on frequent basis every 4 to 6 hours.  If unable to void every 8 hours follow up for re-evaluation with PCM, urgent care or ER.   Call or return to clinic as needed if these symptoms worsen or fail to improve as anticipated. Patient with history of vaginal yeast infections with antibiotic use diflucan has worked well for her in the past.  Diflucan 150mg  po at onset symptoms and may repeat in 72 hours if needed #2 RF0.  Exitcare handout on cystitis given to patient Patient verbalized agreement and understanding of treatment plan and had no further questions at this time. P2:  Hydrate and cranberry juice  Refilled protonix 40mg  po daily #90 RF3.  Discussed signs and symptoms low magnesium with patient and recommended magnesium level in 1 year with PCM since has stopped prenatals/daily multivitamin no longer desiring pregnancy or breast feeding.  The pathophysiology of reflux is discussed.  Exitcare handout on esophageal reflux given to patient. Anti-reflux measures such as raising the head of the bed, avoiding tight clothing or belts, avoiding eating late at night and not lying down shortly after mealtime and achieving weight loss were discussed. Avoid ASA, NSAID's, caffeine, peppermints, alcohol and tobacco. OTC H2 blockers and/or antacids are often very helpful for PRN use.  However, for chronic or daily symptoms, prescription strength H2 blockers or a trial of PPI's should be used.  Patient should alert me if there are persistent symptoms, dysphagia, weight loss or GI bleeding (bright red or black). Follow up with clinic provider or PCM if symptoms persist despite therapy. Patient verbalized agreement and understanding of treatment plan and had no further questions at this time.    P2:  Diet, Food avoidance that aggravate condition, and Fitness

## 2016-12-26 ENCOUNTER — Encounter: Payer: Self-pay | Admitting: Physician Assistant

## 2016-12-26 ENCOUNTER — Ambulatory Visit: Payer: Self-pay | Admitting: Physician Assistant

## 2016-12-26 VITALS — BP 100/80 | HR 86 | Temp 98.8°F

## 2016-12-26 DIAGNOSIS — J209 Acute bronchitis, unspecified: Secondary | ICD-10-CM

## 2016-12-26 MED ORDER — AMOXICILLIN-POT CLAVULANATE 875-125 MG PO TABS: 1.0000 | ORAL_TABLET | Freq: Two times a day (BID) | ORAL | 0 refills | Status: DC

## 2016-12-26 MED ORDER — ALBUTEROL SULFATE HFA 108 (90 BASE) MCG/ACT IN AERS: 2.0000 | INHALATION_SPRAY | Freq: Four times a day (QID) | RESPIRATORY_TRACT | 0 refills | Status: DC | PRN

## 2016-12-26 MED ORDER — FLUCONAZOLE 150 MG PO TABS: ORAL_TABLET | ORAL | 0 refills | Status: DC

## 2016-12-26 NOTE — Progress Notes (Signed)
S: C/o cough and congestion with chest tightness, chest is sore from coughing, ?fever, chills, mucus is green, ; keeping pt awake at night;  Sx for 10 days, denies cardiac type chest pain or sob, v/d, abd pain Remainder ros neg  O: vitals wnl, nad, tms clear, throat injected, neck supple no lymph, lungs c t a, cv rrr, neuro intact  A:  Acute bronchitis   P:  rx medication:  ; smoking cessation, use otc meds, tylenol or motrin as needed for fever/chills, return if not better in 3 -5 days, return earlier if worsening, augmentin, diflucan, albuterol

## 2017-01-21 DIAGNOSIS — N390 Urinary tract infection, site not specified: Secondary | ICD-10-CM | POA: Diagnosis not present

## 2017-01-21 DIAGNOSIS — J40 Bronchitis, not specified as acute or chronic: Secondary | ICD-10-CM | POA: Diagnosis not present

## 2017-01-21 DIAGNOSIS — R300 Dysuria: Secondary | ICD-10-CM | POA: Diagnosis not present

## 2017-02-12 ENCOUNTER — Encounter: Payer: Self-pay | Admitting: Obstetrics and Gynecology

## 2017-02-12 ENCOUNTER — Other Ambulatory Visit: Payer: Self-pay | Admitting: Obstetrics and Gynecology

## 2017-02-12 ENCOUNTER — Ambulatory Visit (INDEPENDENT_AMBULATORY_CARE_PROVIDER_SITE_OTHER): Payer: 59 | Admitting: Obstetrics and Gynecology

## 2017-02-12 VITALS — BP 123/84 | HR 86 | Ht 66.0 in | Wt 175.3 lb

## 2017-02-12 DIAGNOSIS — Z01411 Encounter for gynecological examination (general) (routine) with abnormal findings: Secondary | ICD-10-CM

## 2017-02-12 DIAGNOSIS — R5383 Other fatigue: Secondary | ICD-10-CM

## 2017-02-12 DIAGNOSIS — R635 Abnormal weight gain: Secondary | ICD-10-CM

## 2017-02-12 DIAGNOSIS — E559 Vitamin D deficiency, unspecified: Secondary | ICD-10-CM | POA: Diagnosis not present

## 2017-02-12 DIAGNOSIS — N921 Excessive and frequent menstruation with irregular cycle: Secondary | ICD-10-CM

## 2017-02-12 DIAGNOSIS — Z01419 Encounter for gynecological examination (general) (routine) without abnormal findings: Secondary | ICD-10-CM | POA: Diagnosis not present

## 2017-02-12 DIAGNOSIS — R102 Pelvic and perineal pain: Secondary | ICD-10-CM | POA: Diagnosis not present

## 2017-02-12 DIAGNOSIS — Z978 Presence of other specified devices: Secondary | ICD-10-CM | POA: Diagnosis not present

## 2017-02-12 DIAGNOSIS — Z975 Presence of (intrauterine) contraceptive device: Secondary | ICD-10-CM

## 2017-02-12 MED ORDER — FLUOXETINE HCL 10 MG PO CAPS: 10.0000 mg | ORAL_CAPSULE | Freq: Every day | ORAL | 3 refills | Status: DC

## 2017-02-12 NOTE — Patient Instructions (Addendum)
Preventive Care 18-39 Years, Female Preventive care refers to lifestyle choices and visits with your health care provider that can promote health and wellness. What does preventive care include?  A yearly physical exam. This is also called an annual well check.  Dental exams once or twice a year.  Routine eye exams. Ask your health care provider how often you should have your eyes checked.  Personal lifestyle choices, including: ? Daily care of your teeth and gums. ? Regular physical activity. ? Eating a healthy diet. ? Avoiding tobacco and drug use. ? Limiting alcohol use. ? Practicing safe sex. ? Taking vitamin and mineral supplements as recommended by your health care provider. What happens during an annual well check? The services and screenings done by your health care provider during your annual well check will depend on your age, overall health, lifestyle risk factors, and family history of disease. Counseling Your health care provider may ask you questions about your:  Alcohol use.  Tobacco use.  Drug use.  Emotional well-being.  Home and relationship well-being.  Sexual activity.  Eating habits.  Work and work Statistician.  Method of birth control.  Menstrual cycle.  Pregnancy history.  Screening You may have the following tests or measurements:  Height, weight, and BMI.  Diabetes screening. This is done by checking your blood sugar (glucose) after you have not eaten for a while (fasting).  Blood pressure.  Lipid and cholesterol levels. These may be checked every 5 years starting at age 38.  Skin check.  Hepatitis C blood test.  Hepatitis B blood test.  Sexually transmitted disease (STD) testing.  BRCA-related cancer screening. This may be done if you have a family history of breast, ovarian, tubal, or peritoneal cancers.  Pelvic exam and Pap test. This may be done every 3 years starting at age 38. Starting at age 30, this may be done  every 5 years if you have a Pap test in combination with an HPV test.  Discuss your test results, treatment options, and if necessary, the need for more tests with your health care provider. Vaccines Your health care provider may recommend certain vaccines, such as:  Influenza vaccine. This is recommended every year.  Tetanus, diphtheria, and acellular pertussis (Tdap, Td) vaccine. You may need a Td booster every 10 years.  Varicella vaccine. You may need this if you have not been vaccinated.  HPV vaccine. If you are 39 or younger, you may need three doses over 6 months.  Measles, mumps, and rubella (MMR) vaccine. You may need at least one dose of MMR. You may also need a second dose.  Pneumococcal 13-valent conjugate (PCV13) vaccine. You may need this if you have certain conditions and were not previously vaccinated.  Pneumococcal polysaccharide (PPSV23) vaccine. You may need one or two doses if you smoke cigarettes or if you have certain conditions.  Meningococcal vaccine. One dose is recommended if you are age 68-21 years and a first-year college student living in a residence hall, or if you have one of several medical conditions. You may also need additional booster doses.  Hepatitis A vaccine. You may need this if you have certain conditions or if you travel or work in places where you may be exposed to hepatitis A.  Hepatitis B vaccine. You may need this if you have certain conditions or if you travel or work in places where you may be exposed to hepatitis B.  Haemophilus influenzae type b (Hib) vaccine. You may need this  if you have certain risk factors.  Talk to your health care provider about which screenings and vaccines you need and how often you need them. This information is not intended to replace advice given to you by your health care provider. Make sure you discuss any questions you have with your health care provider. Document Released: 07/17/2001 Document Revised:  02/08/2016 Document Reviewed: 03/22/2015 Elsevier Interactive Patient Education  2017 [employer].    Generalized Anxiety Disorder, Adult Generalized anxiety disorder (GAD) is a mental health disorder. People with this condition constantly worry about everyday events. Unlike normal anxiety, worry related to GAD is not triggered by a specific event. These worries also do not fade or get better with time. GAD interferes with life functions, including relationships, work, and school. GAD can vary from mild to severe. People with severe GAD can have intense waves of anxiety with physical symptoms (panic attacks). What are the causes? The exact cause of GAD is not known. What increases the risk? This condition is more likely to develop in:  Women.  People who have a family history of anxiety disorders.  People who are very shy.  People who experience very stressful life events, such as the death of a loved one.  People who have a very stressful family environment.  What are the signs or symptoms? People with GAD often worry excessively about many things in their lives, such as their health and family. They may also be overly concerned about:  Doing well at work.  Being on time.  Natural disasters.  Friendships.  Physical symptoms of GAD include:  Fatigue.  Muscle tension or having muscle twitches.  Trembling or feeling shaky.  Being easily startled.  Feeling like your heart is pounding or racing.  Feeling out of breath or like you cannot take a deep breath.  Having trouble falling asleep or staying asleep.  Sweating.  Nausea, diarrhea, or irritable bowel syndrome (IBS).  Headaches.  Trouble concentrating or remembering facts.  Restlessness.  Irritability.  How is this diagnosed? Your health care provider can diagnose GAD based on your symptoms and medical history. You will also have a physical exam. The health care provider will ask specific questions about  your symptoms, including how severe they are, when they started, and if they come and go. Your health care provider may ask you about your use of alcohol or drugs, including prescription medicines. Your health care provider may refer you to a mental health specialist for further evaluation. Your health care provider will do a thorough examination and may perform additional tests to rule out other possible causes of your symptoms. To be diagnosed with GAD, a person must have anxiety that:  Is out of his or her control.  Affects several different aspects of his or her life, such as work and relationships.  Causes distress that makes him or her unable to take part in normal activities.  Includes at least three physical symptoms of GAD, such as restlessness, fatigue, trouble concentrating, irritability, muscle tension, or sleep problems.  Before your health care provider can confirm a diagnosis of GAD, these symptoms must be present more days than they are not, and they must last for six months or longer. How is this treated? The following therapies are usually used to treat GAD:  Medicine. Antidepressant medicine is usually prescribed for long-term daily control. Antianxiety medicines may be added in severe cases, especially when panic attacks occur.  Talk therapy (psychotherapy). Certain types of talk therapy can  be helpful in treating GAD by providing support, education, and guidance. Options include: ? Cognitive behavioral therapy (CBT). People learn coping skills and techniques to ease their anxiety. They learn to identify unrealistic or negative thoughts and behaviors and to replace them with positive ones. ? Acceptance and commitment therapy (ACT). This treatment teaches people how to be mindful as a way to cope with unwanted thoughts and feelings. ? Biofeedback. This process trains you to manage your body's response (physiological response) through breathing techniques and relaxation  methods. You will work with a therapist while machines are used to monitor your physical symptoms.  Stress management techniques. These include yoga, meditation, and exercise.  A mental health specialist can help determine which treatment is best for you. Some people see improvement with one type of therapy. However, other people require a combination of therapies. Follow these instructions at home:  Take over-the-counter and prescription medicines only as told by your health care provider.  Try to maintain a normal routine.  Try to anticipate stressful situations and allow extra time to manage them.  Practice any stress management or self-calming techniques as taught by your health care provider.  Do not punish yourself for setbacks or for not making progress.  Try to recognize your accomplishments, even if they are small.  Keep all follow-up visits as told by your health care provider. This is important. Contact a health care provider if:  Your symptoms do not get better.  Your symptoms get worse.  You have signs of depression, such as: ? A persistently sad, cranky, or irritable mood. ? Loss of enjoyment in activities that used to bring you joy. ? Change in weight or eating. ? Changes in sleeping habits. ? Avoiding friends or family members. ? Loss of energy for normal tasks. ? Feelings of guilt or worthlessness. Get help right away if:  You have serious thoughts about hurting yourself or others. If you ever feel like you may hurt yourself or others, or have thoughts about taking your own life, get help right away. You can go to your nearest emergency department or call:  Your local emergency services (911 in the [place]  A suicide crisis helpline, such as the [place] at 602 520 3433. This is open 24 hours a day.  Summary  Generalized anxiety disorder (GAD) is a mental health disorder that involves worry that is not triggered by a specific  event.  People with GAD often worry excessively about many things in their lives, such as their health and family.  GAD may cause physical symptoms such as restlessness, trouble concentrating, sleep problems, frequent sweating, nausea, diarrhea, headaches, and trembling or muscle twitching.  A mental health specialist can help determine which treatment is best for you. Some people see improvement with one type of therapy. However, other people require a combination of therapies. This information is not intended to replace advice given to you by your health care provider. Make sure you discuss any questions you have with your health care provider. Document Released: 09/15/2012 Document Revised: 04/10/2016 Document Reviewed: 04/10/2016 Elsevier Interactive Patient Education  [employer].

## 2017-02-12 NOTE — Progress Notes (Signed)
Subjective:   Rebecca Evans is a 31 y.o. G1P1001 Caucasian female here for a routine well-woman exam.  No LMP recorded. Patient has had an implant.    Current complaints: daily anxiety getting worse open to medications. Feels really tired. irregular bleeding. Weight gain.  PCP: me       does desire labs  Social History: Sexual: heterosexual Marital Status: married Living situation: with family Occupation: CNA Tobacco/alcohol: no tobacco use Illicit drugs: no history of illicit drug use  The following portions of the patient's history were reviewed and updated as appropriate: allergies, current medications, past family history, past medical history, past social history, past surgical history and problem list.  Past Medical History Past Medical History:  Diagnosis Date  . Anxiety   . Herpes genitalis in women   . Low-lying placenta     Past Surgical History Past Surgical History:  Procedure Laterality Date  . ESOPHAGOGASTRODUODENOSCOPY (EGD) WITH PROPOFOL N/A 06/19/2016   Procedure: ESOPHAGOGASTRODUODENOSCOPY (EGD) WITH PROPOFOL;  Surgeon: Lucilla Lame, MD;  Location: [facility] ENDOSCOPY;  Service: Endoscopy;  Laterality: N/A;  . LAPROSCOPY      Gynecologic History G2P1001  No LMP recorded. Patient has had an implant. Contraception: Nexplanon Last Pap: 2016. Results were: normal   Obstetric History OB History  Gravida Para Term Preterm AB Living  2 1 1     1   SAB TAB Ectopic Multiple Live Births          1    # Outcome Date GA Lbr Len/2nd Weight Sex Delivery Anes PTL Lv  2 Term 05/07/13    F Vag-Spont   LIV  1 Gravida               Current Medications Current Outpatient Prescriptions on File Prior to Visit  Medication Sig Dispense Refill  . etonogestrel (NEXPLANON) 68 MG IMPL implant 1 each (68 mg total) by Subdermal route once. 1 each 0  . pantoprazole (PROTONIX) 40 MG tablet Take 1 tablet (40 mg total) by mouth daily. 90 tablet 3  . albuterol (PROVENTIL  HFA;VENTOLIN HFA) 108 (90 Base) MCG/ACT inhaler Inhale 2 puffs into the lungs every 6 (six) hours as needed for wheezing or shortness of breath. (Patient not taking: Reported on 02/12/2017) 1 Inhaler 0  . cholecalciferol (VITAMIN D) 400 UNITS TABS tablet Take 400 Units by mouth. Reported on 11/29/2015     No current facility-administered medications on file prior to visit.     Review of Systems Patient denies any headaches, blurred vision, shortness of breath, chest pain, abdominal pain, problems with bowel movements, urination, or intercourse.  Objective:  BP 123/84   Pulse 86   Ht 5\' 6"  (1.676 m)   Wt 175 lb 4.8 oz (79.5 kg)   BMI 28.29 kg/m  Physical Exam  General:  Well developed, well nourished, no acute distress. She is alert and oriented x3. Skin:  Warm and dry Neck:  Midline trachea, no thyromegaly or nodules Cardiovascular: Regular rate and rhythm, no murmur heard Lungs:  Effort normal, all lung fields clear to auscultation bilaterally Breasts:  No dominant palpable mass, retraction, or nipple discharge Abdomen:  Soft, non tender, no hepatosplenomegaly or masses Pelvic:  External genitalia is normal in appearance.  The vagina is normal in appearance. The cervix is bulbous, no CMT.  Thin prep pap is done with HR HPV cotesting. Uterus is felt to be normal size, shape, and contour.  No adnexal masses or tenderness noted. Extremities:  No swelling or  varicosities noted Psych:  She has a normal mood and affect  Assessment:   Healthy well-woman exam  Plan:  Labs obtained, will follow up accordingly F/U 1 year for AE, or sooner if needed   Melody Rockney Ghee, CNM

## 2017-02-13 LAB — CBC
HEMOGLOBIN: 14.4 g/dL (ref 11.1–15.9)
Hematocrit: 41.6 % (ref 34.0–46.6)
MCH: 30.5 pg (ref 26.6–33.0)
MCHC: 34.6 g/dL (ref 31.5–35.7)
MCV: 88 fL (ref 79–97)
PLATELETS: 379 10*3/uL (ref 150–379)
RBC: 4.72 x10E6/uL (ref 3.77–5.28)
RDW: 13.2 % (ref 12.3–15.4)
WBC: 9.7 10*3/uL (ref 3.4–10.8)

## 2017-02-13 LAB — COMPREHENSIVE METABOLIC PANEL
A/G RATIO: 1.2 (ref 1.2–2.2)
ALBUMIN: 4.1 g/dL (ref 3.5–5.5)
ALT: 16 IU/L (ref 0–32)
AST: 13 IU/L (ref 0–40)
Alkaline Phosphatase: 107 IU/L (ref 39–117)
BILIRUBIN TOTAL: 0.3 mg/dL (ref 0.0–1.2)
BUN / CREAT RATIO: 9 (ref 9–23)
BUN: 7 mg/dL (ref 6–20)
CALCIUM: 9.3 mg/dL (ref 8.7–10.2)
CHLORIDE: 102 mmol/L (ref 96–106)
CO2: 23 mmol/L (ref 20–29)
Creatinine, Ser: 0.8 mg/dL (ref 0.57–1.00)
GFR, EST AFRICAN AMERICAN: 114 mL/min/{1.73_m2} (ref >59)
GFR, EST NON AFRICAN AMERICAN: 99 mL/min/{1.73_m2} (ref >59)
Globulin, Total: 3.3 g/dL (ref 1.5–4.5)
Glucose: 104 mg/dL — ABNORMAL HIGH (ref 65–99)
POTASSIUM: 4.2 mmol/L (ref 3.5–5.2)
SODIUM: 141 mmol/L (ref 134–144)
TOTAL PROTEIN: 7.4 g/dL (ref 6.0–8.5)

## 2017-02-13 LAB — B12 AND FOLATE PANEL
FOLATE: 16.7 ng/mL (ref >3.0)
VITAMIN B 12: 966 pg/mL (ref 232–1245)

## 2017-02-13 LAB — THYROID PANEL WITH TSH
Free Thyroxine Index: 1.9 (ref 1.2–4.9)
T3 Uptake Ratio: 22 % — ABNORMAL LOW (ref 24–39)
T4 TOTAL: 8.5 ug/dL (ref 4.5–12.0)
TSH: 0.76 u[IU]/mL (ref 0.450–4.500)

## 2017-02-13 LAB — HEMOGLOBIN A1C
ESTIMATED AVERAGE GLUCOSE: 103 mg/dL
Hgb A1c MFr Bld: 5.2 % (ref 4.8–5.6)

## 2017-02-13 LAB — LIPID PANEL
CHOL/HDL RATIO: 6.2 ratio — AB (ref 0.0–4.4)
CHOLESTEROL TOTAL: 218 mg/dL — AB (ref 100–199)
HDL: 35 mg/dL — AB (ref >39)
LDL Calculated: 142 mg/dL — ABNORMAL HIGH (ref 0–99)
TRIGLYCERIDES: 205 mg/dL — AB (ref 0–149)
VLDL Cholesterol Cal: 41 mg/dL — ABNORMAL HIGH (ref 5–40)

## 2017-02-13 LAB — FERRITIN: Ferritin: 118 ng/mL (ref 15–150)

## 2017-02-13 LAB — VITAMIN D 25 HYDROXY (VIT D DEFICIENCY, FRACTURES): Vit D, 25-Hydroxy: 31.1 ng/mL (ref 30.0–100.0)

## 2017-02-14 LAB — CYTOLOGY - PAP

## 2017-03-13 ENCOUNTER — Ambulatory Visit (INDEPENDENT_AMBULATORY_CARE_PROVIDER_SITE_OTHER): Payer: 59

## 2017-03-13 ENCOUNTER — Encounter: Payer: Self-pay | Admitting: Obstetrics and Gynecology

## 2017-03-13 ENCOUNTER — Ambulatory Visit (INDEPENDENT_AMBULATORY_CARE_PROVIDER_SITE_OTHER): Payer: 59 | Admitting: Obstetrics and Gynecology

## 2017-03-13 VITALS — BP 118/82 | HR 88 | Ht 67.0 in | Wt 174.4 lb

## 2017-03-13 DIAGNOSIS — N921 Excessive and frequent menstruation with irregular cycle: Secondary | ICD-10-CM

## 2017-03-13 DIAGNOSIS — Z975 Presence of (intrauterine) contraceptive device: Secondary | ICD-10-CM

## 2017-03-13 DIAGNOSIS — Z978 Presence of other specified devices: Secondary | ICD-10-CM | POA: Diagnosis not present

## 2017-03-13 DIAGNOSIS — N83201 Unspecified ovarian cyst, right side: Secondary | ICD-10-CM | POA: Diagnosis not present

## 2017-03-13 DIAGNOSIS — R102 Pelvic and perineal pain: Secondary | ICD-10-CM | POA: Diagnosis not present

## 2017-03-13 NOTE — Progress Notes (Signed)
  Indications: Pelvic Pain Findings:  The uterus is anteverted and measures 5.4 x 2.1 x 3.6 cm. Echo texture is homogenous without evidence of focal masses.  The Endometrium appears WNL and measures 4.1 mm.  Right Ovary measures 3.9 x 3.2 x 3.2 cm. There is a 3.6 x 2.4 x 2.8 cm cystic mass with a few internal projections seen. No internal flow seen in the cystic mass, but good vascular flow seen in the ovarian tissue. Left Ovary measures 2.8 x 1.1 x 1.9 cm, and appears  WNL. Survey of the adnexa demonstrates no adnexal masses. There is no free fluid in the cul de sac.  Impression: 1. Large right complex ovarian cyst. Uterus, endometrium, adnexa and left ovary all appear WNL.  Recommendations: 1.Clinical correlation with the patient's History and Physical Exam.   Macarthur Critchley, RDMS, RVT  SCAN REVIEWED AND AGREE WITH FINDINGS, DISCUSSED WITH PATIENT AT TODAY'S VISIT.  Kierstynn Babich,CNM  2 packs Minastrin given to take for the next 6 weeks. Counseled on surgical options in leu of history of endometriosis and ovarian cyst. Will discuss with spouse and consider.  RTC 6 weeks for repeat pelvic u/s and see me afterwards.

## 2017-03-13 NOTE — Patient Instructions (Signed)
Ovarian Cyst An ovarian cyst is a fluid-filled sac that forms on an ovary. The ovaries are small organs that produce eggs in women. Various types of cysts can form on the ovaries. Some may cause symptoms and require treatment. Most ovarian cysts go away on their own, are not cancerous (are benign), and do not cause problems. Common types of ovarian cysts include:  Functional (follicle) cysts. ? Occur during the menstrual cycle, and usually go away with the next menstrual cycle if you do not get pregnant. ? Usually cause no symptoms.  Endometriomas. ? Are cysts that form from the tissue that lines the uterus (endometrium). ? Are sometimes called "chocolate cysts" because they become filled with blood that turns brown. ? Can cause pain in the lower abdomen during intercourse and during your period.  Cystadenoma cysts. ? Develop from cells on the outside surface of the ovary. ? Can get very large and cause lower abdomen pain and pain with intercourse. ? Can cause severe pain if they twist or break open (rupture).  Dermoid cysts. ? Are sometimes found in both ovaries. ? May contain different kinds of body tissue, such as skin, teeth, hair, or cartilage. ? Usually do not cause symptoms unless they get very big.  Theca lutein cysts. ? Occur when too much of a certain hormone (human chorionic gonadotropin) is produced and overstimulates the ovaries to produce an egg. ? Are most common after having procedures used to assist with the conception of a baby (in vitro fertilization).  What are the causes? Ovarian cysts may be caused by:  Ovarian hyperstimulation syndrome. This is a condition that can develop from taking fertility medicines. It causes multiple large ovarian cysts to form.  Polycystic ovarian syndrome (PCOS). This is a common hormonal disorder that can cause ovarian cysts, as well as problems with your period or fertility.  What increases the risk? The following factors may make  you more likely to develop ovarian cysts:  Being overweight or obese.  Taking fertility medicines.  Taking certain forms of hormonal birth control.  Smoking.  What are the signs or symptoms? Many ovarian cysts do not cause symptoms. If symptoms are present, they may include:  Pelvic pain or pressure.  Pain in the lower abdomen.  Pain during sex.  Abdominal swelling.  Abnormal menstrual periods.  Increasing pain with menstrual periods.  How is this diagnosed? These cysts are commonly found during a routine pelvic exam. You may have tests to find out more about the cyst, such as:  Ultrasound.  X-ray of the pelvis.  CT scan.  MRI.  Blood tests.  How is this treated? Many ovarian cysts go away on their own without treatment. Your health care provider may want to check your cyst regularly for 2-3 months to see if it changes. If you are in menopause, it is especially important to have your cyst monitored closely because menopausal women have a higher rate of ovarian cancer. When treatment is needed, it may include:  Medicines to help relieve pain.  A procedure to drain the cyst (aspiration).  Surgery to remove the whole cyst.  Hormone treatment or birth control pills. These methods are sometimes used to help dissolve a cyst.  Follow these instructions at home:  Take over-the-counter and prescription medicines only as told by your health care provider.  Do not drive or use heavy machinery while taking prescription pain medicine.  Get regular pelvic exams and Pap tests as often as told by your health care   provider.  Return to your normal activities as told by your health care provider. Ask your health care provider what activities are safe for you.  Do not use any products that contain nicotine or tobacco, such as cigarettes and e-cigarettes. If you need help quitting, ask your health care provider.  Keep all follow-up visits as told by your health care provider.  This is important. Contact a health care provider if:  Your periods are late, irregular, or painful, or they stop.  You have pelvic pain that does not go away.  You have pressure on your bladder or trouble emptying your bladder completely.  You have pain during sex.  You have any of the following in your abdomen: ? A feeling of fullness. ? Pressure. ? Discomfort. ? Pain that does not go away. ? Swelling.  You feel generally ill.  You become constipated.  You lose your appetite.  You develop severe acne.  You start to have more body hair and facial hair.  You are gaining weight or losing weight without changing your exercise and eating habits.  You think you may be pregnant. Get help right away if:  You have abdominal pain that is severe or gets worse.  You cannot eat or drink without vomiting.  You suddenly develop a fever.  Your menstrual period is much heavier than usual. This information is not intended to replace advice given to you by your health care provider. Make sure you discuss any questions you have with your health care provider. Document Released: 05/21/2005 Document Revised: 12/09/2015 Document Reviewed: 10/23/2015 Elsevier Interactive Patient Education  2018 [employer]  

## 2017-04-17 ENCOUNTER — Encounter: Payer: 59 | Admitting: Obstetrics and Gynecology

## 2017-04-17 ENCOUNTER — Other Ambulatory Visit: Payer: 59

## 2017-04-22 ENCOUNTER — Encounter: Payer: Self-pay | Admitting: Obstetrics and Gynecology

## 2017-04-22 ENCOUNTER — Ambulatory Visit (INDEPENDENT_AMBULATORY_CARE_PROVIDER_SITE_OTHER): Payer: 59

## 2017-04-22 ENCOUNTER — Ambulatory Visit (INDEPENDENT_AMBULATORY_CARE_PROVIDER_SITE_OTHER): Payer: 59 | Admitting: Obstetrics and Gynecology

## 2017-04-22 VITALS — BP 117/81 | HR 74 | Ht 66.0 in | Wt 175.4 lb

## 2017-04-22 DIAGNOSIS — N809 Endometriosis, unspecified: Secondary | ICD-10-CM

## 2017-04-22 DIAGNOSIS — K219 Gastro-esophageal reflux disease without esophagitis: Secondary | ICD-10-CM | POA: Diagnosis not present

## 2017-04-22 DIAGNOSIS — F419 Anxiety disorder, unspecified: Secondary | ICD-10-CM | POA: Diagnosis not present

## 2017-04-22 DIAGNOSIS — N83201 Unspecified ovarian cyst, right side: Secondary | ICD-10-CM

## 2017-04-22 MED ORDER — LANSOPRAZOLE 30 MG PO CPDR: 30.0000 mg | DELAYED_RELEASE_CAPSULE | Freq: Every day | ORAL | 6 refills | Status: DC

## 2017-04-22 MED ORDER — ESCITALOPRAM OXALATE 10 MG PO TABS: 10.0000 mg | ORAL_TABLET | Freq: Every day | ORAL | 6 refills | Status: DC

## 2017-04-22 NOTE — Patient Instructions (Signed)

## 2017-04-22 NOTE — Progress Notes (Signed)
S: Here for ultrasound and medication follow up.  Stated she could not tell a difference in the 2 months taking prozac and ran out of prescription 3 days ago and did not get it refilled. Denies any unwanted side effects, just states it did help anxiety at all. Is willing to try a different medication as anxiety is still daily. Spouse is with her and confirms all stated, and is supportive.   States protonix (taking it in the am) not working and is waking up nauseated daily with reflux. Taking TUMS a few times a day. Desires trying something else.   States she is still having daily lower pelvic pain, pain with urination and sex. Is still bleeding a small amount after sex.  She strongly desires a hysterectomy if ultrasound shows anything suggestive of the endometriosis worsening.  They do not want anymore children. We have tried IUD(couldn't tolerate it and bled daily), OCPs made her nauseated. Depo caused weight gain. And current nexplanon not regulating bleeding. Added Minastrin x2 packs but has not seen any resolution of symptoms.  O: A&Ox4 Well groomed female in no distress Blood pressure 117/81, pulse 74, height 5\' 6"  (1.676 m), weight 175 lb 6.4 oz (79.6 kg). Body mass index is 28.31 kg/m.   Ultrasound today reviewed  Date of Service:  04/22/17   Indications: F/U Right Ovarian Cyst Findings:  The uterus measures 6.6 x 3.4 x 2.4 cm. Echo texture is homogeneous without evidence of focal masses. The Endometrium measures 2.7 mm.  Multiple calcifications are noted within the endometrium in the fundal area.  Right Ovary measures 3.7 x 2.9 x 2.8 cm and contains a simple cyst measuring 3.0 x 2.4 x 2.3 cm.  Left Ovary measures 1.9 x 1.7 x 1.1 cm and appears WNL.  Survey of the adnexa demonstrates no adnexal masses. There is no free fluid in the cul de sac.  Impression: 1. Anteverted uterus appears of normal size and contour. 2. Areas of calcification noted within the fundal area of the  endometrium. 3. Right ovarian simple cyst measuring 3.0 x 2.4 x 2.3 cm.  A: persistent right ovarian cyst Endometriosis Pelvic pain Anxiety GERD  P: will try lexapro at 10mg  daily. Also switching to prevacid at bedtime daily. It worked for her during last pregnancy. Referred to physician to discuss hysterectomy. Information given to review.   RTC at next available.  Terren Haberle,CNM

## 2017-04-24 ENCOUNTER — Other Ambulatory Visit: Payer: 59

## 2017-05-01 ENCOUNTER — Ambulatory Visit (INDEPENDENT_AMBULATORY_CARE_PROVIDER_SITE_OTHER): Payer: 59 | Admitting: Obstetrics and Gynecology

## 2017-05-01 ENCOUNTER — Other Ambulatory Visit: Payer: Self-pay

## 2017-05-01 VITALS — BP 108/69 | HR 81 | Ht 66.0 in | Wt 177.0 lb

## 2017-05-01 DIAGNOSIS — N939 Abnormal uterine and vaginal bleeding, unspecified: Secondary | ICD-10-CM | POA: Diagnosis not present

## 2017-05-01 DIAGNOSIS — N809 Endometriosis, unspecified: Secondary | ICD-10-CM

## 2017-05-01 DIAGNOSIS — N941 Unspecified dyspareunia: Secondary | ICD-10-CM

## 2017-05-01 DIAGNOSIS — N83201 Unspecified ovarian cyst, right side: Secondary | ICD-10-CM | POA: Diagnosis not present

## 2017-05-01 DIAGNOSIS — N946 Dysmenorrhea, unspecified: Secondary | ICD-10-CM | POA: Diagnosis not present

## 2017-05-01 DIAGNOSIS — Z975 Presence of (intrauterine) contraceptive device: Secondary | ICD-10-CM

## 2017-05-01 DIAGNOSIS — R102 Pelvic and perineal pain: Secondary | ICD-10-CM | POA: Diagnosis not present

## 2017-05-01 DIAGNOSIS — N921 Excessive and frequent menstruation with irregular cycle: Secondary | ICD-10-CM

## 2017-05-01 DIAGNOSIS — Z978 Presence of other specified devices: Secondary | ICD-10-CM | POA: Diagnosis not present

## 2017-05-01 NOTE — Progress Notes (Signed)
Pt is here to discuss hysterectomy. Pt of Rebecca Evans.  GYN ENCOUNTER NOTE  Subjective:       Rebecca Evans is a 31 y.o. G35P1001 female is here for gynecologic evaluation of the following issues:  1. Consultation regarding hysterectomy.   2. History of endometriosis 3. Abnormal uterine bleeding with Nexplanon in place  Endometriosis diagnosed by Dr[facility] approximately 7 years ago. Patient has since had 2 spontaneous vaginal deliveries with the largest baby weighing 7 lbs. 6 oz. After her last child, Nexplanon was inserted 2 years ago. She continues to have irregular bleeding from the vagina with associated pelvic pain. She also is experiencing vaginal bleeding with bowel movements as well as tenesmus with bowel movements. 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 Prior to Nexplanon placement and prior to children, the patient has had long history of dysmenorrhea. Dysmenorrhea 7/8 out of 10 in intensity. She has never missed work because of her pelvic pain. Pain is described as central cramping with bilateral radiation, right greater than left. She experiences no low back pain or radiation into the buttocks or thighs. 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 Menstrual bleeding has typically been heavy with clots lasting upwards of 8 days with the first 4-5 days being very heavy.  Both patient and her husband now are in agreement that they no longer want any children. They're comfortable with her to current children and desired definitive surgery at this time. Presently the desire is for removal of both ovaries and tubes along with the uterus.   Gynecologic History No LMP recorded. Patient has had an implant. Contraception: Nexplanon Last Pap: 02/12/2017 negative/negative Last mammogram: N/A  Obstetric History OB History  Gravida Para Term Preterm AB Living  2 1 1     1   SAB TAB Ectopic Multiple Live Births          1     # Outcome Date GA Lbr Len/2nd Weight Sex Delivery Anes PTL Lv  2 Term 05/07/13    F Vag-Spont   LIV  1 Gravida               Past Medical History:  Diagnosis Date  . Anxiety   . Herpes genitalis in women   . Low-lying placenta     Past Surgical History:  Procedure Laterality Date  . ESOPHAGOGASTRODUODENOSCOPY (EGD) WITH PROPOFOL N/A 06/19/2016   Procedure: ESOPHAGOGASTRODUODENOSCOPY (EGD) WITH PROPOFOL;  Surgeon: Lucilla Lame, MD;  Location: [facility] ENDOSCOPY;  Service: Endoscopy;  Laterality: N/A;  . LAPROSCOPY      Current Outpatient Medications on File Prior to Visit  Medication Sig Dispense Refill  . albuterol (PROVENTIL HFA;VENTOLIN HFA) 108 (90 Base) MCG/ACT inhaler Inhale 2 puffs into the lungs every 6 (six) hours as needed for wheezing or shortness of breath. 1 Inhaler 0  . escitalopram (LEXAPRO) 10 MG tablet Take 1 tablet (10 mg total) daily by mouth. 30 tablet 6  . etonogestrel (NEXPLANON) 68 MG IMPL implant 1 each (68 mg total) by Subdermal route once. 1 each 0  . lansoprazole (PREVACID) 30 MG capsule Take 1 capsule (30 mg total) daily at 12 noon by mouth. 30 capsule 6   No current facility-administered medications on file prior to visit.     No Known Allergies  Social History   Socioeconomic History  . Marital status: Married    Spouse name: Not on file  . Number of children: Not on file  . Years  of education: Not on file  . Highest education level: Not on file  Social Needs  . Financial resource strain: Not on file  . Food insecurity - worry: Not on file  . Food insecurity - inability: Not on file  . Transportation needs - medical: Not on file  . Transportation needs - non-medical: Not on file  Occupational History  . Not on file  Tobacco Use  . Smoking status: Never Smoker  . Smokeless tobacco: Never Used  Substance and Sexual Activity  . Alcohol use: No  . Drug use: No  . Sexual activity: Yes    Birth control/protection: None, Implant  Other  Topics Concern  . Not on file  Social History Narrative  . Not on file    Family History  Problem Relation Age of Onset  . Diabetes Maternal Grandmother   . Diabetes Maternal Grandfather     The following portions of the patient's history were reviewed and updated as appropriate: allergies, current medications, past family history, past medical history, past social history, past surgical history and problem list.  Review of Systems Comprehensive review of systems is negative except for that noted in the history of present illness  Objective:   BP 108/69   Pulse 81   Ht 5\' 6"  (1.676 m)   Wt 177 lb (80.3 kg)   BMI 28.57 kg/m  CONSTITUTIONAL: Well-developed, well-nourished female in no acute distress.  HENT:  Normocephalic, atraumatic.  NECK: Normal range of motion, supple, no masses.  Normal thyroid.  SKIN: Skin is warm and dry. No rash noted. Not diaphoretic. No erythema. No pallor. [place]: Alert and oriented to person, place, and time. PSYCHIATRIC: Normal mood and affect. Normal behavior. Normal judgment and thought content. CARDIOVASCULAR:Not Examined RESPIRATORY: Not Examined BACK: No CVA tenderness or spinal tenderness BREASTS: Not Examined ABDOMEN: Soft, non distended; Non tender.  No Organomegaly. PELVIC: Gynecoid pelvis  External Genitalia: Normal  BUS: Normal  Vagina: Normal; first degree cystocele; no rectocele  Cervix: Large eversion; no discharge; 3/4 cervical motion tenderness  Uterus: Normal size, shape,consistency, mobile, midplane, tender 3/4  Adnexa: Nonpalpable, bilateral tenderness right (3/4, left 1/4)  RV: Normal external exam  Bladder: Nontender MUSCULOSKELETAL: Normal range of motion. No tenderness.  No cyanosis, clubbing, or edema.     Assessment:   1. Endometriosis, symptomatic; no longer desiring further childbearing; desired definitive treatment  2. Right ovarian cyst  3. Breakthrough bleeding on Nexplanon, desires Nexplanon removal  in or  4. Pelvic pain, history of severe dysmenorrhea  5. Dyspareunia, female, ongoing     Plan:   1. [facility] BSO; removal of Nexplanon 2. Return in 1 week before surgery for preoperative appointment  A total of 25 minutes were spent face-to-face with the patient during this encounter and over half of that time involved counseling and coordination of care.  Brayton Mars, MD  Note: This dictation was prepared with Dragon dictation along with smaller phrase technology. Any transcriptional errors that result from this process are unintentional.

## 2017-05-01 NOTE — Patient Instructions (Signed)
1.  [facility] bilateral salpingectomy with right oophorectomy is scheduled before 2019 2.  Return 1 week before surgery for preop appointment

## 2017-05-08 ENCOUNTER — Ambulatory Visit (INDEPENDENT_AMBULATORY_CARE_PROVIDER_SITE_OTHER): Payer: 59 | Admitting: Obstetrics and Gynecology

## 2017-05-08 ENCOUNTER — Encounter: Payer: Self-pay | Admitting: Obstetrics and Gynecology

## 2017-05-08 ENCOUNTER — Other Ambulatory Visit: Payer: Self-pay

## 2017-05-08 ENCOUNTER — Encounter
Admission: RE | Admit: 2017-05-08 | Discharge: 2017-05-08 | Disposition: A | Discharge status: Home / Self Care | Payer: 59 | Source: Ambulatory Visit | Attending: Obstetrics and Gynecology | Admitting: Obstetrics and Gynecology

## 2017-05-08 VITALS — BP 101/68 | HR 82 | Ht 66.0 in | Wt 180.7 lb

## 2017-05-08 DIAGNOSIS — Z975 Presence of (intrauterine) contraceptive device: Secondary | ICD-10-CM

## 2017-05-08 DIAGNOSIS — N921 Excessive and frequent menstruation with irregular cycle: Secondary | ICD-10-CM

## 2017-05-08 DIAGNOSIS — B3731 Acute candidiasis of vulva and vagina: Secondary | ICD-10-CM

## 2017-05-08 DIAGNOSIS — R300 Dysuria: Secondary | ICD-10-CM

## 2017-05-08 DIAGNOSIS — N939 Abnormal uterine and vaginal bleeding, unspecified: Secondary | ICD-10-CM

## 2017-05-08 DIAGNOSIS — N809 Endometriosis, unspecified: Secondary | ICD-10-CM

## 2017-05-08 DIAGNOSIS — N83201 Unspecified ovarian cyst, right side: Secondary | ICD-10-CM

## 2017-05-08 DIAGNOSIS — Z978 Presence of other specified devices: Secondary | ICD-10-CM

## 2017-05-08 DIAGNOSIS — B373 Candidiasis of vulva and vagina: Secondary | ICD-10-CM

## 2017-05-08 DIAGNOSIS — Z01818 Encounter for other preprocedural examination: Secondary | ICD-10-CM

## 2017-05-08 DIAGNOSIS — N946 Dysmenorrhea, unspecified: Secondary | ICD-10-CM

## 2017-05-08 HISTORY — DX: Personal history of urinary calculi: Z87.442

## 2017-05-08 HISTORY — DX: Gastro-esophageal reflux disease without esophagitis: K21.9

## 2017-05-08 LAB — POCT URINALYSIS DIPSTICK
BILIRUBIN UA: NEGATIVE
Blood, UA: NEGATIVE
GLUCOSE UA: NEGATIVE
KETONES UA: NEGATIVE
LEUKOCYTES UA: NEGATIVE
Nitrite, UA: NEGATIVE
PH UA: 6.5 (ref 5.0–8.0)
Protein, UA: NEGATIVE
Spec Grav, UA: 1.01 (ref 1.010–1.025)
Urobilinogen, UA: 0.2 E.U./dL

## 2017-05-08 MED ORDER — FLUCONAZOLE 150 MG PO TABS: 150.0000 mg | ORAL_TABLET | ORAL | 3 refills | Status: DC

## 2017-05-08 NOTE — H&P (View-Only) (Signed)
Subjective: PREOPERATIVE HISTORY AND PHYSICAL  Date of surgery: 05/13/2017 Procedure: LAVH BSO and Nexplanon removal Diagnosis: 1.  Symptomatic endometriosis 2.  Abnormal uterine bleeding with Nexplanon   Patient is a 31 y.o. G2P1070female scheduled for surgery on 05/13/2017.  Endometriosis diagnosed by [facility] approximately 7 years ago. Patient has since had 2 spontaneous vaginal deliveries with the largest baby weighing 7 lbs. 6 oz. After her last child, Nexplanon was inserted 2 years ago. She continues to have irregular bleeding from the vagina with associated pelvic pain. She also is experiencing vaginal bleeding with bowel movements as well as tenesmus with bowel movements. 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 Prior to Nexplanon placement and prior to children, the patient has had long history of dysmenorrhea. Dysmenorrhea 7/8 out of 10 in intensity. She has never missed work because of her pelvic pain. Pain is described as central cramping with bilateral radiation, right greater than left. She experiences no low back pain or radiation into the buttocks or thighs. 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 Menstrual bleeding has typically been heavy with clots lasting upwards of 8 days with the first 4-5 days being very heavy.  Both patient and her husband now are in agreement that they no longer want any children. They're comfortable with her to current children and desired definitive surgery at this time. Presently the desire is for removal of both ovaries and tubes along with the uterus.   Gynecologic History No LMP recorded. Patient has had an implant. Contraception: Nexplanon Last Pap: 02/12/2017 negative/negative Last mammogram: N/A   OB History    Gravida Para Term Preterm AB Living   2 1 1     1    SAB TAB Ectopic Multiple Live Births           1      No LMP recorded (lmp unknown). Patient has had an  implant.    Past Medical History:  Diagnosis Date  . Anxiety   . Herpes genitalis in women   . Low-lying placenta     Past Surgical History:  Procedure Laterality Date  . ESOPHAGOGASTRODUODENOSCOPY (EGD) WITH PROPOFOL N/A 06/19/2016   Procedure: ESOPHAGOGASTRODUODENOSCOPY (EGD) WITH PROPOFOL;  Surgeon: Lucilla Lame, MD;  Location: [facility] ENDOSCOPY;  Service: Endoscopy;  Laterality: N/A;  . LAPROSCOPY      OB History  Gravida Para Term Preterm AB Living  2 1 1     1   SAB TAB Ectopic Multiple Live Births          1    # Outcome Date GA Lbr Len/2nd Weight Sex Delivery Anes PTL Lv  2 Term 05/07/13    F Vag-Spont   LIV  1 Gravida               Social History   Socioeconomic History  . Marital status: Married    Spouse name: None  . Number of children: None  . Years of education: None  . Highest education level: None  Social Needs  . Financial resource strain: None  . Food insecurity - worry: None  . Food insecurity - inability: None  . Transportation needs - medical: None  . Transportation needs - non-medical: None  Occupational History  . None  Tobacco Use  . Smoking status: Never Smoker  . Smokeless tobacco: Never Used  Substance and Sexual Activity  . Alcohol use: No  . Drug use: No  . Sexual activity: Yes  Birth control/protection: None, Implant  Other Topics Concern  . None  Social History Narrative  . None    Family History  Problem Relation Age of Onset  . Diabetes Maternal Grandmother   . Diabetes Maternal Grandfather      (Not in a hospital admission)  No Known Allergies  Review of Systems Constitutional: No recent fever/chills/sweats Respiratory: No recent cough/bronchitis Cardiovascular: No chest pain Gastrointestinal: No recent nausea/vomiting/diarrhea Genitourinary: No UTI symptoms Hematologic/lymphatic:No history of coagulopathy or recent blood thinner use    Objective:    BP 101/68   Pulse 82   Ht 5\' 6"  (1.676 m)   Wt 180 lb  11.2 oz (82 kg)   LMP  (LMP Unknown)   BMI 29.17 kg/m   General:   Normal  Skin:   normal  HEENT:  Normal  Neck:  Supple without Adenopathy or Thyromegaly  Lungs:   Heart:              Breasts:   Abdomen:  Pelvis:  M/S   Extremeties:  Neuro:    clear to auscultation bilaterally   Normal without murmur   Not Examined   soft, non-tender; bowel sounds normal; no masses,  no organomegaly   Exam deferred to OR  No CVAT  Warm/Dry   Normal        04/23/2017 PELVIC: Gynecoid pelvis             External Genitalia: Normal             BUS: Normal             Vagina: Normal; first degree cystocele; no rectocele             Cervix: Large eversion; no discharge; 3/4 cervical motion tenderness             Uterus: Normal size, shape,consistency, mobile, midplane, tender 3/4             Adnexa: Nonpalpable, bilateral tenderness right (3/4, left 1/4)             RV: Normal external exam             Bladder: Nontender   Assessment:     1.  Symptomatic endometriosis 2.  Abnormal uterine bleeding with Nexplanon; desires Nexplanon removal   Plan:  LAVH BSO Nexplanon removal   Preop counseling: Patient is to undergo LAVH BSO and Nexplanon removal for management of symptomatic endometriosis as well as desiring Nexplanon removal.  She is understanding of the planned procedures and is aware of and is accepting of all surgical risks which include but are not limited to bleeding, infection, pelvic organ injury need for repair, blood clot disorders, anesthesia risk, etc.  All questions have been answered.  Informed consent is given.  Patient is ready willing to proceed with surgery as scheduled. Patient does understand that she may need to be on long-term estrogen replacement therapy in order to prevent vasomotor symptoms, and prevent vaginal dryness as well as osteoporosis.  Brayton Mars, MD  Note: This dictation was prepared with Dragon dictation along with smaller phrase  technology. Any transcriptional errors that result from this process are unintentional.

## 2017-05-08 NOTE — Patient Instructions (Signed)
1.  Diflucan 150 mg orally every 3 days x2 doses for yeast infection 2.  Return 1 week after surgery for postop check

## 2017-05-08 NOTE — H&P (Signed)
Subjective: PREOPERATIVE HISTORY AND PHYSICAL  Date of surgery: 05/13/2017 Procedure: LAVH BSO and Nexplanon removal Diagnosis: 1.  Symptomatic endometriosis 2.  Abnormal uterine bleeding with Nexplanon   Patient is a 31 y.o. G2P1014female scheduled for surgery on 05/13/2017.  Endometriosis diagnosed by [facility] approximately 7 years ago. Patient has since had 2 spontaneous vaginal deliveries with the largest baby weighing 7 lbs. 6 oz. After her last child, Nexplanon was inserted 2 years ago. She continues to have irregular bleeding from the vagina with associated pelvic pain. She also is experiencing vaginal bleeding with bowel movements as well as tenesmus with bowel movements. 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 Prior to Nexplanon placement and prior to children, the patient has had long history of dysmenorrhea. Dysmenorrhea 7/8 out of 10 in intensity. She has never missed work because of her pelvic pain. Pain is described as central cramping with bilateral radiation, right greater than left. She experiences no low back pain or radiation into the buttocks or thighs. 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 Menstrual bleeding has typically been heavy with clots lasting upwards of 8 days with the first 4-5 days being very heavy.  Both patient and her husband now are in agreement that they no longer want any children. They're comfortable with her to current children and desired definitive surgery at this time. Presently the desire is for removal of both ovaries and tubes along with the uterus.   Gynecologic History No LMP recorded. Patient has had an implant. Contraception: Nexplanon Last Pap: 02/12/2017 negative/negative Last mammogram: N/A   OB History    Gravida Para Term Preterm AB Living   2 1 1     1    SAB TAB Ectopic Multiple Live Births           1      No LMP recorded (lmp unknown). Patient has had an  implant.    Past Medical History:  Diagnosis Date  . Anxiety   . Herpes genitalis in women   . Low-lying placenta     Past Surgical History:  Procedure Laterality Date  . ESOPHAGOGASTRODUODENOSCOPY (EGD) WITH PROPOFOL N/A 06/19/2016   Procedure: ESOPHAGOGASTRODUODENOSCOPY (EGD) WITH PROPOFOL;  Surgeon: Lucilla Lame, MD;  Location: [facility] ENDOSCOPY;  Service: Endoscopy;  Laterality: N/A;  . LAPROSCOPY      OB History  Gravida Para Term Preterm AB Living  2 1 1     1   SAB TAB Ectopic Multiple Live Births          1    # Outcome Date GA Lbr Len/2nd Weight Sex Delivery Anes PTL Lv  2 Term 05/07/13    F Vag-Spont   LIV  1 Gravida               Social History   Socioeconomic History  . Marital status: Married    Spouse name: None  . Number of children: None  . Years of education: None  . Highest education level: None  Social Needs  . Financial resource strain: None  . Food insecurity - worry: None  . Food insecurity - inability: None  . Transportation needs - medical: None  . Transportation needs - non-medical: None  Occupational History  . None  Tobacco Use  . Smoking status: Never Smoker  . Smokeless tobacco: Never Used  Substance and Sexual Activity  . Alcohol use: No  . Drug use: No  . Sexual activity: Yes  Birth control/protection: None, Implant  Other Topics Concern  . None  Social History Narrative  . None    Family History  Problem Relation Age of Onset  . Diabetes Maternal Grandmother   . Diabetes Maternal Grandfather      (Not in a hospital admission)  No Known Allergies  Review of Systems Constitutional: No recent fever/chills/sweats Respiratory: No recent cough/bronchitis Cardiovascular: No chest pain Gastrointestinal: No recent nausea/vomiting/diarrhea Genitourinary: No UTI symptoms Hematologic/lymphatic:No history of coagulopathy or recent blood thinner use    Objective:    BP 101/68   Pulse 82   Ht 5\' 6"  (1.676 m)   Wt 180 lb  11.2 oz (82 kg)   LMP  (LMP Unknown)   BMI 29.17 kg/m   General:   Normal  Skin:   normal  HEENT:  Normal  Neck:  Supple without Adenopathy or Thyromegaly  Lungs:   Heart:              Breasts:   Abdomen:  Pelvis:  M/S   Extremeties:  Neuro:    clear to auscultation bilaterally   Normal without murmur   Not Examined   soft, non-tender; bowel sounds normal; no masses,  no organomegaly   Exam deferred to OR  No CVAT  Warm/Dry   Normal        04/23/2017 PELVIC: Gynecoid pelvis             External Genitalia: Normal             BUS: Normal             Vagina: Normal; first degree cystocele; no rectocele             Cervix: Large eversion; no discharge; 3/4 cervical motion tenderness             Uterus: Normal size, shape,consistency, mobile, midplane, tender 3/4             Adnexa: Nonpalpable, bilateral tenderness right (3/4, left 1/4)             RV: Normal external exam             Bladder: Nontender   Assessment:     1.  Symptomatic endometriosis 2.  Abnormal uterine bleeding with Nexplanon; desires Nexplanon removal   Plan:  LAVH BSO Nexplanon removal   Preop counseling: Patient is to undergo LAVH BSO and Nexplanon removal for management of symptomatic endometriosis as well as desiring Nexplanon removal.  She is understanding of the planned procedures and is aware of and is accepting of all surgical risks which include but are not limited to bleeding, infection, pelvic organ injury need for repair, blood clot disorders, anesthesia risk, etc.  All questions have been answered.  Informed consent is given.  Patient is ready willing to proceed with surgery as scheduled. Patient does understand that she may need to be on long-term estrogen replacement therapy in order to prevent vasomotor symptoms, and prevent vaginal dryness as well as osteoporosis.  Brayton Mars, MD  Note: This dictation was prepared with Dragon dictation along with smaller phrase  technology. Any transcriptional errors that result from this process are unintentional.

## 2017-05-08 NOTE — Progress Notes (Signed)
Subjective: PREOPERATIVE HISTORY AND PHYSICAL  Date of surgery: 05/13/2017 Procedure: LAVH BSO and Nexplanon removal Diagnosis: 1.  Symptomatic endometriosis 2.  Abnormal uterine bleeding with Nexplanon   Patient is a 31 y.o. G2P108female scheduled for surgery on 05/13/2017.  Endometriosis diagnosed by [facility] approximately 7 years ago. Patient has since had 2 spontaneous vaginal deliveries with the largest baby weighing 7 lbs. 6 oz. After her last child, Nexplanon was inserted 2 years ago. She continues to have irregular bleeding from the vagina with associated pelvic pain. She also is experiencing vaginal bleeding with bowel movements as well as tenesmus with bowel movements. 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 Prior to Nexplanon placement and prior to children, the patient has had long history of dysmenorrhea. Dysmenorrhea 7/8 out of 10 in intensity. She has never missed work because of her pelvic pain. Pain is described as central cramping with bilateral radiation, right greater than left. She experiences no low back pain or radiation into the buttocks or thighs. 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 Menstrual bleeding has typically been heavy with clots lasting upwards of 8 days with the first 4-5 days being very heavy.  Both patient and her husband now are in agreement that they no longer want any children. They're comfortable with her to current children and desired definitive surgery at this time. Presently the desire is for removal of both ovaries and tubes along with the uterus.   Gynecologic History No LMP recorded. Patient has had an implant. Contraception: Nexplanon Last Pap: 02/12/2017 negative/negative Last mammogram: N/A   OB History    Gravida Para Term Preterm AB Living   2 1 1     1    SAB TAB Ectopic Multiple Live Births           1      No LMP recorded (lmp unknown). Patient has had an  implant.    Past Medical History:  Diagnosis Date  . Anxiety   . Herpes genitalis in women   . Low-lying placenta     Past Surgical History:  Procedure Laterality Date  . ESOPHAGOGASTRODUODENOSCOPY (EGD) WITH PROPOFOL N/A 06/19/2016   Procedure: ESOPHAGOGASTRODUODENOSCOPY (EGD) WITH PROPOFOL;  Surgeon: Lucilla Lame, MD;  Location: [facility] ENDOSCOPY;  Service: Endoscopy;  Laterality: N/A;  . LAPROSCOPY      OB History  Gravida Para Term Preterm AB Living  2 1 1     1   SAB TAB Ectopic Multiple Live Births          1    # Outcome Date GA Lbr Len/2nd Weight Sex Delivery Anes PTL Lv  2 Term 05/07/13    F Vag-Spont   LIV  1 Gravida               Social History   Socioeconomic History  . Marital status: Married    Spouse name: None  . Number of children: None  . Years of education: None  . Highest education level: None  Social Needs  . Financial resource strain: None  . Food insecurity - worry: None  . Food insecurity - inability: None  . Transportation needs - medical: None  . Transportation needs - non-medical: None  Occupational History  . None  Tobacco Use  . Smoking status: Never Smoker  . Smokeless tobacco: Never Used  Substance and Sexual Activity  . Alcohol use: No  . Drug use: No  . Sexual activity: Yes  Birth control/protection: None, Implant  Other Topics Concern  . None  Social History Narrative  . None    Family History  Problem Relation Age of Onset  . Diabetes Maternal Grandmother   . Diabetes Maternal Grandfather      (Not in a hospital admission)  No Known Allergies  Review of Systems Constitutional: No recent fever/chills/sweats Respiratory: No recent cough/bronchitis Cardiovascular: No chest pain Gastrointestinal: No recent nausea/vomiting/diarrhea Genitourinary: No UTI symptoms Hematologic/lymphatic:No history of coagulopathy or recent blood thinner use    Objective:    BP 101/68   Pulse 82   Ht 5\' 6"  (1.676 m)   Wt 180 lb  11.2 oz (82 kg)   LMP  (LMP Unknown)   BMI 29.17 kg/m   General:   Normal  Skin:   normal  HEENT:  Normal  Neck:  Supple without Adenopathy or Thyromegaly  Lungs:   Heart:              Breasts:   Abdomen:  Pelvis:  M/S   Extremeties:  Neuro:    clear to auscultation bilaterally   Normal without murmur   Not Examined   soft, non-tender; bowel sounds normal; no masses,  no organomegaly   Exam deferred to OR  No CVAT  Warm/Dry   Normal        04/23/2017 PELVIC: Gynecoid pelvis             External Genitalia: Normal             BUS: Normal             Vagina: Normal; first degree cystocele; no rectocele             Cervix: Large eversion; no discharge; 3/4 cervical motion tenderness             Uterus: Normal size, shape,consistency, mobile, midplane, tender 3/4             Adnexa: Nonpalpable, bilateral tenderness right (3/4, left 1/4)             RV: Normal external exam             Bladder: Nontender   Assessment:     1.  Symptomatic endometriosis 2.  Abnormal uterine bleeding with Nexplanon; desires Nexplanon removal   Plan:  LAVH BSO Nexplanon removal   Preop counseling: Patient is to undergo LAVH BSO and Nexplanon removal for management of symptomatic endometriosis as well as desiring Nexplanon removal.  She is understanding of the planned procedures and is aware of and is accepting of all surgical risks which include but are not limited to bleeding, infection, pelvic organ injury need for repair, blood clot disorders, anesthesia risk, etc.  All questions have been answered.  Informed consent is given.  Patient is ready willing to proceed with surgery as scheduled. Patient does understand that she may need to be on long-term estrogen replacement therapy in order to prevent vasomotor symptoms, and prevent vaginal dryness as well as osteoporosis.  Brayton Mars, MD  Note: This dictation was prepared with Dragon dictation along with smaller phrase  technology. Any transcriptional errors that result from this process are unintentional.

## 2017-05-08 NOTE — Patient Instructions (Signed)
  Your procedure is scheduled on: 05-13-17 MONDAY Report to Same Day Surgery 2nd floor medical mall [address] Entrance-take elevator on left to 2nd floor.  Check in with surgery information desk.) To find out your arrival time please call [phone] between 1PM - 3PM on 05-10-17 FRIDAY  Remember: Instructions that are not followed completely may result in serious medical risk, up to and including death, or upon the discretion of your surgeon and anesthesiologist your surgery may need to be rescheduled.    _x___ 1. Do not eat food after midnight the night before your procedure. NO GUM OR CANDY AFTER MIDNIGHT.  You may drink clear liquids up to 2 hours before you are scheduled to arrive at the hospital for your procedure.  Do not drink clear liquids within 2 hours of your scheduled arrival to the hospital.  Clear liquids include  --Water or Apple juice without pulp  --Clear carbohydrate beverage such as ClearFast or Gatorade  --Black Coffee or Clear Tea (No milk, no creamers, do not add anything to the coffee or Tea      __x__ 2. No Alcohol for 24 hours before or after surgery.   __x__3. No Smoking for 24 prior to surgery.   ____  4. Bring all medications with you on the day of surgery if instructed.    __x__ 5. Notify your doctor if there is any change in your medical condition     (cold, fever, infections).     Do not wear jewelry, make-up, hairpins, clips or nail polish.  Do not wear lotions, powders, or perfumes. You may wear deodorant.  Do not shave 48 hours prior to surgery. Men may shave face and neck.  Do not bring valuables to the hospital.    [facility] is not responsible for any belongings or valuables.               Contacts, dentures or bridgework may not be worn into surgery.  Leave your suitcase in the car. After surgery it may be brought to your room.  For patients admitted to the hospital, discharge time is determined by your treatment team.   Patients  discharged the day of surgery will not be allowed to drive home.  You will need someone to drive you home and stay with you the night of your procedure.    Please read over the following fact sheets that you were given:   [facility] Preparing for Surgery and or MRSA Information   _x___ TAKE THE FOLLOWING MEDICATION THE MORNING OF SURGERY WITH A SMALL SIP OF WATER. These include:  1. LEXAPRO (ESCITALOPRAM)  2. PREVACID  3.  4.  5.  6.  ____Fleets enema or Magnesium Citrate as directed.   _x___ Use CHG Soap or sage wipes as directed on instruction sheet   ____ Use inhalers on the day of surgery and bring to hospital day of surgery  ____ Stop Metformin and Janumet 2 days prior to surgery.    ____ Take 1/2 of usual insulin dose the night before surgery and none on the morning surgery.   ____ Follow recommendations from Cardiologist, Pulmonologist or PCP regarding stopping Aspirin, Coumadin, Plavix ,Eliquis, Effient, or Pradaxa, and Pletal.  X____Stop Anti-inflammatories such as Advil, Aleve, Ibuprofen, Motrin, Naproxen, Naprosyn, Goodies powders or aspirin products NOW-OK to take Tylenol   _x___ Stop supplements until after surgery-STOP CRANBERRY AND LYSINE NOW-MAY RESUME AFTER SURGERY   ____ Bring C-Pap to the hospital.

## 2017-05-09 ENCOUNTER — Other Ambulatory Visit: Payer: Self-pay

## 2017-05-09 ENCOUNTER — Encounter
Admission: RE | Admit: 2017-05-09 | Discharge: 2017-05-09 | Disposition: A | Discharge status: Home / Self Care | Payer: 59 | Source: Ambulatory Visit | Attending: Obstetrics and Gynecology | Admitting: Obstetrics and Gynecology

## 2017-05-09 DIAGNOSIS — Z01812 Encounter for preprocedural laboratory examination: Secondary | ICD-10-CM | POA: Diagnosis not present

## 2017-05-09 LAB — CBC WITH DIFFERENTIAL/PLATELET
BASOS ABS: 0.1 10*3/uL (ref 0–0.1)
Basophils Relative: 1 %
Eosinophils Absolute: 0.2 10*3/uL (ref 0–0.7)
Eosinophils Relative: 2 %
HEMATOCRIT: 42.2 % (ref 35.0–47.0)
Hemoglobin: 14.4 g/dL (ref 12.0–16.0)
LYMPHS PCT: 35 %
Lymphs Abs: 4.4 10*3/uL — ABNORMAL HIGH (ref 1.0–3.6)
MCH: 30.1 pg (ref 26.0–34.0)
MCHC: 34.2 g/dL (ref 32.0–36.0)
MCV: 88 fL (ref 80.0–100.0)
Monocytes Absolute: 0.4 10*3/uL (ref 0.2–0.9)
Monocytes Relative: 3 %
NEUTROS ABS: 7.4 10*3/uL — AB (ref 1.4–6.5)
NEUTROS PCT: 59 %
PLATELETS: 355 10*3/uL (ref 150–440)
RBC: 4.79 MIL/uL (ref 3.80–5.20)
RDW: 13 % (ref 11.5–14.5)
WBC: 12.6 10*3/uL — AB (ref 3.6–11.0)

## 2017-05-09 LAB — TYPE AND SCREEN
ABO/RH(D): O POS
Antibody Screen: NEGATIVE

## 2017-05-10 LAB — NUSWAB BV AND CANDIDA, NAA
CANDIDA ALBICANS, NAA: NEGATIVE
CANDIDA GLABRATA, NAA: NEGATIVE

## 2017-05-10 LAB — URINE CULTURE

## 2017-05-10 LAB — HIV ANTIBODY (ROUTINE TESTING W REFLEX): HIV SCREEN 4TH GENERATION: NONREACTIVE

## 2017-05-12 LAB — RPR: RPR Ser Ql: NONREACTIVE

## 2017-05-12 MED ORDER — CEFAZOLIN SODIUM-DEXTROSE 2-4 GM/100ML-% IV SOLN
2.0000 g | INTRAVENOUS | Status: AC
  Administered 2017-05-13: 2 g via INTRAVENOUS

## 2017-05-13 ENCOUNTER — Observation Stay
Admission: RE | Admit: 2017-05-13 | Discharge: 2017-05-14 | Disposition: A | Discharge status: Home / Self Care | Payer: 59 | Source: Ambulatory Visit | Attending: Obstetrics and Gynecology | Admitting: Obstetrics and Gynecology

## 2017-05-13 ENCOUNTER — Ambulatory Visit: Payer: 59 | Admitting: Certified Registered Nurse Anesthetist

## 2017-05-13 ENCOUNTER — Encounter: Payer: Self-pay | Admitting: Certified Registered Nurse Anesthetist

## 2017-05-13 ENCOUNTER — Other Ambulatory Visit: Payer: Self-pay

## 2017-05-13 ENCOUNTER — Encounter
Admission: RE | Disposition: A | Discharge status: Home / Self Care | Payer: Self-pay | Source: Ambulatory Visit | Attending: Obstetrics and Gynecology

## 2017-05-13 DIAGNOSIS — Z90710 Acquired absence of both cervix and uterus: Secondary | ICD-10-CM

## 2017-05-13 DIAGNOSIS — K219 Gastro-esophageal reflux disease without esophagitis: Secondary | ICD-10-CM | POA: Insufficient documentation

## 2017-05-13 DIAGNOSIS — N809 Endometriosis, unspecified: Principal | ICD-10-CM | POA: Insufficient documentation

## 2017-05-13 DIAGNOSIS — N946 Dysmenorrhea, unspecified: Secondary | ICD-10-CM | POA: Insufficient documentation

## 2017-05-13 DIAGNOSIS — F419 Anxiety disorder, unspecified: Secondary | ICD-10-CM | POA: Insufficient documentation

## 2017-05-13 DIAGNOSIS — N72 Inflammatory disease of cervix uteri: Secondary | ICD-10-CM | POA: Diagnosis not present

## 2017-05-13 DIAGNOSIS — N83201 Unspecified ovarian cyst, right side: Secondary | ICD-10-CM | POA: Diagnosis not present

## 2017-05-13 DIAGNOSIS — N83202 Unspecified ovarian cyst, left side: Secondary | ICD-10-CM | POA: Diagnosis not present

## 2017-05-13 DIAGNOSIS — N941 Unspecified dyspareunia: Secondary | ICD-10-CM | POA: Diagnosis not present

## 2017-05-13 DIAGNOSIS — R102 Pelvic and perineal pain: Secondary | ICD-10-CM | POA: Diagnosis not present

## 2017-05-13 DIAGNOSIS — N939 Abnormal uterine and vaginal bleeding, unspecified: Secondary | ICD-10-CM | POA: Insufficient documentation

## 2017-05-13 DIAGNOSIS — Z79899 Other long term (current) drug therapy: Secondary | ICD-10-CM | POA: Diagnosis not present

## 2017-05-13 HISTORY — PX: REMOVAL OF DRUG DELIVERY IMPLANT: SHX6585

## 2017-05-13 HISTORY — PX: LAPAROSCOPIC VAGINAL HYSTERECTOMY WITH SALPINGO OOPHORECTOMY: SHX6681

## 2017-05-13 LAB — POCT PREGNANCY, URINE: PREG TEST UR: NEGATIVE

## 2017-05-13 SURGERY — HYSTERECTOMY, VAGINAL, LAPAROSCOPY-ASSISTED, WITH SALPINGO-OOPHORECTOMY
Anesthesia: General | Site: Vagina | Laterality: Left | Wound class: Clean Contaminated

## 2017-05-13 MED ORDER — HYDROMORPHONE HCL 1 MG/ML IJ SOLN
INTRAMUSCULAR | Status: AC
  Filled 2017-05-13: qty 1

## 2017-05-13 MED ORDER — OXYCODONE HCL 5 MG/5ML PO SOLN: 5.0000 mg | Freq: Once | ORAL | Status: DC | PRN

## 2017-05-13 MED ORDER — LACTATED RINGERS IV SOLN
INTRAVENOUS | Status: DC
  Administered 2017-05-13 – 2017-05-14 (×2): via INTRAVENOUS

## 2017-05-13 MED ORDER — FENTANYL CITRATE (PF) 100 MCG/2ML IJ SOLN
INTRAMUSCULAR | Status: DC | PRN
  Administered 2017-05-13: 100 ug via INTRAVENOUS

## 2017-05-13 MED ORDER — HYDROMORPHONE HCL 1 MG/ML IJ SOLN
0.5000 mg | INTRAMUSCULAR | Status: DC | PRN
  Administered 2017-05-13 (×2): 0.5 mg via INTRAVENOUS

## 2017-05-13 MED ORDER — SUGAMMADEX SODIUM 200 MG/2ML IV SOLN
INTRAVENOUS | Status: DC | PRN
  Administered 2017-05-13: 200 mg via INTRAVENOUS

## 2017-05-13 MED ORDER — KETOROLAC TROMETHAMINE 30 MG/ML IJ SOLN
30.0000 mg | Freq: Four times a day (QID) | INTRAMUSCULAR | Status: DC
  Administered 2017-05-13 – 2017-05-14 (×3): 30 mg via INTRAVENOUS
  Filled 2017-05-13 (×3): qty 1

## 2017-05-13 MED ORDER — FENTANYL CITRATE (PF) 100 MCG/2ML IJ SOLN
INTRAMUSCULAR | Status: AC
  Administered 2017-05-13: 50 ug via INTRAVENOUS
  Filled 2017-05-13: qty 2

## 2017-05-13 MED ORDER — LIDOCAINE HCL (PF) 2 % IJ SOLN
INTRAMUSCULAR | Status: AC
Start: 2017-05-13 — End: ?
  Filled 2017-05-13: qty 10

## 2017-05-13 MED ORDER — MORPHINE SULFATE (PF) 2 MG/ML IV SOLN
1.0000 mg | INTRAVENOUS | Status: DC | PRN
  Administered 2017-05-13 – 2017-05-14 (×4): 2 mg via INTRAVENOUS
  Filled 2017-05-13 (×4): qty 1

## 2017-05-13 MED ORDER — KETOROLAC TROMETHAMINE 30 MG/ML IJ SOLN
30.0000 mg | Freq: Four times a day (QID) | INTRAMUSCULAR | Status: DC
  Filled 2017-05-13: qty 1

## 2017-05-13 MED ORDER — ACETAMINOPHEN 10 MG/ML IV SOLN
INTRAVENOUS | Status: DC | PRN
  Administered 2017-05-13: 1000 mg via INTRAVENOUS

## 2017-05-13 MED ORDER — OXYCODONE HCL 5 MG PO TABS: 5.0000 mg | ORAL_TABLET | Freq: Once | ORAL | Status: DC | PRN

## 2017-05-13 MED ORDER — OXYCODONE-ACETAMINOPHEN 5-325 MG PO TABS
1.0000 | ORAL_TABLET | ORAL | Status: DC | PRN
  Administered 2017-05-13 – 2017-05-14 (×4): 2 via ORAL
  Filled 2017-05-13 (×4): qty 2

## 2017-05-13 MED ORDER — KETOROLAC TROMETHAMINE 30 MG/ML IJ SOLN
30.0000 mg | Freq: Four times a day (QID) | INTRAMUSCULAR | Status: DC
  Administered 2017-05-13: 30 mg via INTRAVENOUS
  Filled 2017-05-13: qty 1

## 2017-05-13 MED ORDER — LACTATED RINGERS IV SOLN
INTRAVENOUS | Status: DC
  Administered 2017-05-13: 10:00:00 via INTRAVENOUS

## 2017-05-13 MED ORDER — SIMETHICONE 80 MG PO CHEW
80.0000 mg | CHEWABLE_TABLET | Freq: Four times a day (QID) | ORAL | Status: DC | PRN
  Administered 2017-05-13 (×2): 80 mg via ORAL
  Filled 2017-05-13 (×2): qty 1

## 2017-05-13 MED ORDER — EPHEDRINE SULFATE 50 MG/ML IJ SOLN
INTRAMUSCULAR | Status: DC | PRN
  Administered 2017-05-13 (×2): 10 mg via INTRAVENOUS
  Administered 2017-05-13: 5 mg via INTRAVENOUS

## 2017-05-13 MED ORDER — ACETAMINOPHEN NICU IV SYRINGE 10 MG/ML
INTRAVENOUS | Status: AC
  Filled 2017-05-13: qty 1

## 2017-05-13 MED ORDER — HYDROMORPHONE HCL 1 MG/ML IJ SOLN
INTRAMUSCULAR | Status: DC | PRN
  Administered 2017-05-13 (×2): 0.5 mg via INTRAVENOUS

## 2017-05-13 MED ORDER — ROCURONIUM BROMIDE 50 MG/5ML IV SOLN
INTRAVENOUS | Status: AC
Start: 2017-05-13 — End: ?
  Filled 2017-05-13: qty 1

## 2017-05-13 MED ORDER — DEXAMETHASONE SODIUM PHOSPHATE 10 MG/ML IJ SOLN
INTRAMUSCULAR | Status: DC | PRN
  Administered 2017-05-13: 10 mg via INTRAVENOUS

## 2017-05-13 MED ORDER — PHENYLEPHRINE HCL 10 MG/ML IJ SOLN
INTRAMUSCULAR | Status: DC | PRN
  Administered 2017-05-13 (×2): 200 ug via INTRAVENOUS
  Administered 2017-05-13: 100 ug via INTRAVENOUS

## 2017-05-13 MED ORDER — ONDANSETRON HCL 4 MG/2ML IJ SOLN
INTRAMUSCULAR | Status: AC
  Filled 2017-05-13: qty 2

## 2017-05-13 MED ORDER — ROCURONIUM BROMIDE 50 MG/5ML IV SOLN
INTRAVENOUS | Status: AC
  Filled 2017-05-13: qty 1

## 2017-05-13 MED ORDER — FENTANYL CITRATE (PF) 100 MCG/2ML IJ SOLN
INTRAMUSCULAR | Status: AC
  Filled 2017-05-13: qty 2

## 2017-05-13 MED ORDER — ACETAMINOPHEN 325 MG PO TABS: 650.0000 mg | ORAL_TABLET | ORAL | Status: DC | PRN

## 2017-05-13 MED ORDER — KETOROLAC TROMETHAMINE 30 MG/ML IJ SOLN: 30.0000 mg | Freq: Four times a day (QID) | INTRAMUSCULAR | Status: DC

## 2017-05-13 MED ORDER — ESCITALOPRAM OXALATE 10 MG PO TABS
10.0000 mg | ORAL_TABLET | Freq: Every day | ORAL | Status: DC
  Administered 2017-05-14: 10 mg via ORAL
  Filled 2017-05-13: qty 1

## 2017-05-13 MED ORDER — DOCUSATE SODIUM 100 MG PO CAPS
100.0000 mg | ORAL_CAPSULE | Freq: Two times a day (BID) | ORAL | Status: DC
  Administered 2017-05-13 – 2017-05-14 (×2): 100 mg via ORAL
  Filled 2017-05-13 (×2): qty 1

## 2017-05-13 MED ORDER — ONDANSETRON HCL 4 MG/2ML IJ SOLN
INTRAMUSCULAR | Status: DC | PRN
  Administered 2017-05-13: 4 mg via INTRAVENOUS

## 2017-05-13 MED ORDER — DEXAMETHASONE SODIUM PHOSPHATE 10 MG/ML IJ SOLN
INTRAMUSCULAR | Status: AC
  Filled 2017-05-13: qty 1

## 2017-05-13 MED ORDER — MIDAZOLAM HCL 2 MG/2ML IJ SOLN
INTRAMUSCULAR | Status: DC | PRN
  Administered 2017-05-13: 2 mg via INTRAVENOUS

## 2017-05-13 MED ORDER — PROPOFOL 10 MG/ML IV BOLUS
INTRAVENOUS | Status: AC
  Filled 2017-05-13: qty 20

## 2017-05-13 MED ORDER — MIDAZOLAM HCL 2 MG/2ML IJ SOLN
INTRAMUSCULAR | Status: AC
  Filled 2017-05-13: qty 2

## 2017-05-13 MED ORDER — BISACODYL 10 MG RE SUPP
10.0000 mg | Freq: Every day | RECTAL | Status: DC | PRN
  Filled 2017-05-13: qty 1

## 2017-05-13 MED ORDER — FAMOTIDINE 20 MG PO TABS
ORAL_TABLET | ORAL | Status: AC
  Filled 2017-05-13: qty 1

## 2017-05-13 MED ORDER — PROPOFOL 10 MG/ML IV BOLUS
INTRAVENOUS | Status: DC | PRN
  Administered 2017-05-13: 150 mg via INTRAVENOUS

## 2017-05-13 MED ORDER — LACTATED RINGERS IV SOLN: INTRAVENOUS | Status: DC

## 2017-05-13 MED ORDER — LIDOCAINE HCL (CARDIAC) 20 MG/ML IV SOLN
INTRAVENOUS | Status: DC | PRN
  Administered 2017-05-13: 50 mg via INTRAVENOUS

## 2017-05-13 MED ORDER — CEFAZOLIN SODIUM-DEXTROSE 2-4 GM/100ML-% IV SOLN
INTRAVENOUS | Status: AC
  Filled 2017-05-13: qty 100

## 2017-05-13 MED ORDER — KETOROLAC TROMETHAMINE 30 MG/ML IJ SOLN
INTRAMUSCULAR | Status: AC
  Filled 2017-05-13: qty 1

## 2017-05-13 MED ORDER — FENTANYL CITRATE (PF) 100 MCG/2ML IJ SOLN
25.0000 ug | INTRAMUSCULAR | Status: DC | PRN
  Administered 2017-05-13 (×3): 50 ug via INTRAVENOUS

## 2017-05-13 MED ORDER — GLYCOPYRROLATE 0.2 MG/ML IJ SOLN
INTRAMUSCULAR | Status: AC
  Filled 2017-05-13: qty 1

## 2017-05-13 MED ORDER — ROCURONIUM BROMIDE 100 MG/10ML IV SOLN
INTRAVENOUS | Status: DC | PRN
  Administered 2017-05-13: 30 mg via INTRAVENOUS
  Administered 2017-05-13 (×2): 10 mg via INTRAVENOUS

## 2017-05-13 SURGICAL SUPPLY — 50 items
BAG URINE DRAINAGE (UROLOGICAL SUPPLIES) ×4 IMPLANT
BLADE SURG SZ11 CARB STEEL (BLADE) ×4 IMPLANT
BNDG COHESIVE 4X5 TAN STRL (GAUZE/BANDAGES/DRESSINGS) ×4 IMPLANT
CATH FOLEY 2WAY  5CC 16FR (CATHETERS) ×2
CATH URTH 16FR FL 2W BLN LF (CATHETERS) ×2 IMPLANT
CHLORAPREP W/TINT 26ML (MISCELLANEOUS) ×4 IMPLANT
CLOSURE WOUND 1/2 X4 (GAUZE/BANDAGES/DRESSINGS) ×1
CORD MONOPOLAR M/FML 12FT (MISCELLANEOUS) ×4 IMPLANT
DERMABOND ADVANCED (GAUZE/BANDAGES/DRESSINGS) ×2
DERMABOND ADVANCED .7 DNX12 (GAUZE/BANDAGES/DRESSINGS) ×2 IMPLANT
DRSG TEGADERM 2X2.25 PEDS (GAUZE/BANDAGES/DRESSINGS) IMPLANT
ELECT REM PT RETURN 9FT ADLT (ELECTROSURGICAL) ×4
ELECTRODE REM PT RTRN 9FT ADLT (ELECTROSURGICAL) ×2 IMPLANT
GAUZE PETRO XEROFOAM 1X8 (MISCELLANEOUS) ×4 IMPLANT
GLOVE BIO SURGEON STRL SZ 6.5 (GLOVE) ×6 IMPLANT
GLOVE BIO SURGEON STRL SZ8 (GLOVE) ×20 IMPLANT
GLOVE BIO SURGEONS STRL SZ 6.5 (GLOVE) ×2
GLOVE INDICATOR 7.0 STRL GRN (GLOVE) ×4 IMPLANT
GOWN STRL REUS W/ TWL LRG LVL3 (GOWN DISPOSABLE) ×4 IMPLANT
GOWN STRL REUS W/ TWL XL LVL3 (GOWN DISPOSABLE) ×2 IMPLANT
GOWN STRL REUS W/TWL LRG LVL3 (GOWN DISPOSABLE) ×4
GOWN STRL REUS W/TWL XL LVL3 (GOWN DISPOSABLE) ×2
IRRIGATION STRYKERFLOW (MISCELLANEOUS) IMPLANT
IRRIGATOR STRYKERFLOW (MISCELLANEOUS)
IV LACTATED RINGERS 1000ML (IV SOLUTION) ×4 IMPLANT
KIT PINK PAD W/HEAD ARE REST (MISCELLANEOUS) ×4
KIT PINK PAD W/HEAD ARM REST (MISCELLANEOUS) ×2 IMPLANT
KIT RM TURNOVER CYSTO AR (KITS) ×4 IMPLANT
LABEL OR SOLS (LABEL) ×4 IMPLANT
PACK BASIN MINOR ARMC (MISCELLANEOUS) ×4 IMPLANT
PACK GYN LAPAROSCOPIC (MISCELLANEOUS) ×4 IMPLANT
PAD OB MATERNITY 4.3X12.25 (PERSONAL CARE ITEMS) ×4 IMPLANT
PENCIL ELECTRO HAND CTR (MISCELLANEOUS) ×4 IMPLANT
SCISSORS METZENBAUM CVD 33 (INSTRUMENTS) IMPLANT
SHEARS HARMONIC ACE PLUS 36CM (ENDOMECHANICALS) ×4 IMPLANT
SLEEVE ENDOPATH XCEL 5M (ENDOMECHANICALS) ×8 IMPLANT
SPONGE VERSALON 4X4 4PLY (MISCELLANEOUS) ×4 IMPLANT
SPONGE XRAY 4X4 16PLY STRL (MISCELLANEOUS) ×4 IMPLANT
STRIP CLOSURE SKIN 1/2X4 (GAUZE/BANDAGES/DRESSINGS) ×3 IMPLANT
SUT CHROMIC 2 0 CT 1 (SUTURE) IMPLANT
SUT MNCRL 4-0 (SUTURE) ×2
SUT MNCRL 4-0 27XMFL (SUTURE) ×2
SUT VIC AB 0 CT1 27 (SUTURE)
SUT VIC AB 0 CT1 27XCR 8 STRN (SUTURE) IMPLANT
SUT VIC AB 0 CT1 36 (SUTURE) ×8 IMPLANT
SUTURE MNCRL 4-0 27XMF (SUTURE) ×2 IMPLANT
SYR 10ML LL (SYRINGE) ×4 IMPLANT
TAPE TRANSPORE STRL 2 31045 (GAUZE/BANDAGES/DRESSINGS) ×4 IMPLANT
TROCAR XCEL NON-BLD 5MMX100MML (ENDOMECHANICALS) ×4 IMPLANT
TUBING INSUF HEATED (TUBING) ×4 IMPLANT

## 2017-05-13 NOTE — Anesthesia Procedure Notes (Signed)
Procedure Name: Intubation Date/Time: 05/13/2017 10:21 AM Performed by: Jonna Clark, CRNA Pre-anesthesia Checklist: Patient identified, Patient being monitored, Timeout performed, Emergency Drugs available and Suction available Patient Re-evaluated:Patient Re-evaluated prior to induction Oxygen Delivery Method: Circle system utilized Preoxygenation: Pre-oxygenation with 100% oxygen Induction Type: IV induction Ventilation: Mask ventilation without difficulty Laryngoscope Size: Mac and 3 Grade View: Grade I Tube type: Oral Tube size: 7.0 mm Number of attempts: 1 Airway Equipment and Method: Stylet Placement Confirmation: ETT inserted through vocal cords under direct vision,  positive ETCO2 and breath sounds checked- equal and bilateral Secured at: 22 cm Tube secured with: Tape Dental Injury: Teeth and Oropharynx as per pre-operative assessment

## 2017-05-13 NOTE — Anesthesia Preprocedure Evaluation (Signed)
Anesthesia Evaluation  Patient identified by MRN, date of birth, ID band Patient awake    Reviewed: Allergy & Precautions, H&P , NPO status , Patient's Chart, lab work & pertinent test results  History of Anesthesia Complications Negative for: history of anesthetic complications  Airway Mallampati: II  TM Distance: >3 FB Neck ROM: full    Dental  (+) Chipped   Pulmonary neg pulmonary ROS, neg shortness of breath,           Cardiovascular Exercise Tolerance: Good (-) angina(-) Past MI and (-) DOE negative cardio ROS       Neuro/Psych PSYCHIATRIC DISORDERS Anxiety negative neurological ROS     GI/Hepatic Neg liver ROS, GERD  Medicated and Controlled,  Endo/Other  negative endocrine ROS  Renal/GU      Musculoskeletal   Abdominal   Peds  Hematology negative hematology ROS (+)   Anesthesia Other Findings Past Medical History: No date: Anxiety No date: GERD (gastroesophageal reflux disease) No date: Herpes genitalis in women No date: History of kidney stones     Comment:  h/o No date: Low-lying placenta  Past Surgical History: No date: DIAGNOSTIC LAPAROSCOPY 06/19/2016: ESOPHAGOGASTRODUODENOSCOPY (EGD) WITH PROPOFOL; N/A     Comment:  Procedure: ESOPHAGOGASTRODUODENOSCOPY (EGD) WITH               PROPOFOL;  Surgeon: Lucilla Lame, MD;  Location: [facility]               ENDOSCOPY;  Service: Endoscopy;  Laterality: N/A; No date: LAPROSCOPY No date: TONSILLECTOMY  BMI    Body Mass Index:  31.62 kg/m      Reproductive/Obstetrics negative OB ROS                             Anesthesia Physical Anesthesia Plan  ASA: III  Anesthesia Plan: General ETT   Post-op Pain Management:    Induction: Intravenous  PONV Risk Score and Plan: 4 or greater and Ondansetron, Midazolam and Dexamethasone  Airway Management Planned: Oral ETT  Additional Equipment:   Intra-op Plan:   Post-operative  Plan: Extubation in OR  Informed Consent: I have reviewed the patients History and Physical, chart, labs and discussed the procedure including the risks, benefits and alternatives for the proposed anesthesia with the patient or authorized representative who has indicated his/her understanding and acceptance.   Dental Advisory Given  Plan Discussed with: Anesthesiologist, CRNA and Surgeon  Anesthesia Plan Comments: (Patient consented for risks of anesthesia including but not limited to:  - adverse reactions to medications - damage to teeth, lips or other oral mucosa - sore throat or hoarseness - Damage to heart, brain, lungs or loss of life  Patient voiced understanding.)        Anesthesia Quick Evaluation

## 2017-05-13 NOTE — Anesthesia Postprocedure Evaluation (Signed)
Anesthesia Post Note  Patient: Rebecca Evans  Procedure(s) Performed: LAPAROSCOPIC ASSISTED VAGINAL HYSTERECTOMY WITH BILATERAL SALPINGO OOPHORECTOMY (Bilateral Vagina ) REMOVAL OF DRUG DELIVERY IMPLANT, LEFT ARM (Left )  Patient location during evaluation: PACU Anesthesia Type: General Level of consciousness: awake and alert Pain management: pain level controlled Vital Signs Assessment: post-procedure vital signs reviewed and stable Respiratory status: spontaneous breathing, nonlabored ventilation, respiratory function stable and patient connected to nasal cannula oxygen Cardiovascular status: blood pressure returned to baseline and stable Postop Assessment: no apparent nausea or vomiting Anesthetic complications: no     Last Vitals:  Vitals:   05/13/17 1320 05/13/17 1337  BP: 124/65 122/64  Pulse: 99 100  Resp: 15 20  Temp:    SpO2: 92% 95%    Last Pain:  Vitals:   05/13/17 1344  PainSc: 10-Worst pain ever                 Precious Haws Piscitello

## 2017-05-13 NOTE — Anesthesia Post-op Follow-up Note (Signed)
Anesthesia QCDR form completed.        

## 2017-05-13 NOTE — Transfer of Care (Signed)
Immediate Anesthesia Transfer of Care Note  Patient: Colette Loveday [place]  Procedure(s) Performed: LAPAROSCOPIC ASSISTED VAGINAL HYSTERECTOMY WITH BILATERAL SALPINGO OOPHORECTOMY (Bilateral Vagina ) REMOVAL OF DRUG DELIVERY IMPLANT, LEFT ARM (Left )  Patient Location: PACU  Anesthesia Type:General  Level of Consciousness: sedated  Airway & Oxygen Therapy: Patient Spontanous Breathing and Patient connected to face mask oxygen  Post-op Assessment: Report given to RN and Post -op Vital signs reviewed and stable  Post vital signs: Reviewed and stable  Last Vitals:  Vitals:   05/13/17 0921 05/13/17 1234  BP: 119/77 126/71  Pulse: 79 (!) 101  Resp: 18 17  Temp: 37 C 36.9 C  SpO2: 100% 100%    Last Pain: There were no vitals filed for this visit.       Complications: No apparent anesthesia complications

## 2017-05-13 NOTE — Interval H&P Note (Signed)
History and Physical Interval Note:  05/13/2017 9:55 AM  Rebecca Evans  has presented today for surgery, with the diagnosis of ENDOMETRIOSIS, RIGHT OVARIAN CYST, PELVIC PAIN, DYSPAREUNIA,DYSMENORRHEA,AUB  The various methods of treatment have been discussed with the patient and family. After consideration of risks, benefits and other options for treatment, the patient has consented to  Procedure(s): [place] (Bilateral) as a surgical intervention .  The patient's history has been reviewed, patient examined, no change in status, stable for surgery.  I have reviewed the patient's chart and labs.  Questions were answered to the patient's satisfaction.     Hassell Done A Hosteen Kienast

## 2017-05-13 NOTE — Op Note (Signed)
OPERATIVE NOTE:  Rebecca Evans PROCEDURE DATE: 05/13/2017   PREOPERATIVE DIAGNOSIS: 1.  Symptomatic endometriosis 2.  Abnormal uterine bleeding with Nexplanon  POSTOPERATIVE DIAGNOSIS: 1.  Symptomatic endometriosis 2.  Abnormal uterine bleeding with Nexplanon  PROCEDURE: 1.  Nexplanon removal 2.  LAVH BSO SURGEON:  Alanda Slim Jasmain Ahlberg, MD ASSISTANTS: None ANESTHESIA: General INDICATIONS: 31 y.o. G2P1001 with long history of symptomatic endometriosis, having abnormal uterine bleeding with Nexplanon use, presents for definitive surgery.  FINDINGS: Nexplanon implant is removed intact. Grossly normal uterus, fallopian tubes, and left ovary; right ovary with ovarian cyst; cul-de-sac endometriosis implants-powder burns; upper abdomen normal; ureters noted to be functional post hysterectomy   I/O's: Total I/O In: 1200 [I.V.:1200] Out: 500 [Urine:300; Blood:200] COUNTS:  YES SPECIMENS: Uterus with cervix, bilateral fallopian tubes and ovaries ANTIBIOTIC PROPHYLAXIS:Ancef 2 grams COMPLICATIONS: None immediate  PROCEDURE IN DETAIL: Patient was brought to the operating room and placed in the supine position.  General endotracheal anesthesia was induced without difficulty.  She was placed in the dorsolithotomy position using the bumblebee stirrups.  A ChloraPrep and Betadine abdominal perineal and intravaginal prep and drape was performed in standard fashion.  The patient had been given IV Ancef antibiotic prophylaxis. Timeout was completed. NEXPLANON REMOVAL: The patient's left arm was abducted and flexed on the arm board.  The Nexplanon removal site was painted with Betadine solution.  Using sterile technique, a 10 mm incision was placed just distal to the palpable Nexplanon implant.  Using hemostats, the Nexplanon device was grasped.  Scar tissue was debrided with a scalpel.  The Nexplanon, once adhesiolysis was completed, was then removed with a hemostat, intact.  The Nexplanon  incision site was closed using Steri-Strips.  A pressure dressing including 4 x 4's and Kerlix gauze was placed for hemostasis.  LAVH BSO: Foley catheter was placed into the bladder and was draining clear yellow urine.  A Hulka tenaculum was placed onto the cervix to facilitate uterine manipulation intraoperatively. Subumbilical vertical incision 5 mm in length was made.  The Optiview laparoscopic trocar system was used to place a 5 mm port directly into the abdominal pelvic cavity under direct visualization without evidence of bowel or vascular injury.  2 other 5 mm ports were placed in the right and left lower quadrants respectively under direct visualization.  The above-noted findings were photographed.  The hysterectomy was then performed in the standard fashion using the Ace harmonic scalpel and laparoscopic graspers to facilitate the procedure.  Sequentially the procedure was carried out in a bilateral fashion.  The infundibulopelvic ligament was crossclamped coagulated and cut.  The mesosalpinx likewise was crossclamped coagulated and cut.  The cardinal broad ligament complexes and the round ligament were crossclamped coagulated and cut with the Ace harmonic scalpel.  This sequential progression was carried out down to the level of the uterosacral ligaments.  At this point the anterior/posterior leafs of the broad ligament were opened and the bladder flap was created.  The uterine vessels were cauterized with Kleppinger bipolar forceps bilaterally and eventually transected with the Ace harmonic scalpel.  Once the abdominal portion of the procedure was completed, the pneumoperitoneum was released and attention was turned to the vaginal approach to complete the procedure. Weighted speculum was placed into the vagina.  A double-tooth tenaculum was placed onto the cervix to facilitate the vaginal aspect of the procedure.  The Hulka tenaculum was removed.  Posterior colpotomy was made with Mayo scissors.  The  uterosacral ligaments were clamped cut and stick tied  using 0 Vicryl suture.  The posterior cuff was run using 0 Vicryl suture in a running locking baseball stitch manner.  The remainder of the cardinal broad ligament complexes were clamped cut and stick tied using 0 Vicryl suture.  The anterior cul-de-sac was entered and the 2 remaining pedicles were clamped and cut with removal of the operative specimen.  The final pedicles were tied with a free tie, and followed by a stick tie.  Evaluation of all pedicles were notably good for hemostasis.  The peritoneum was closed using a pursestring stitch of 0 Vicryl suture.  The vagina was closed using simple interrupted sutures of 2-0 chromic suture.  Following completion of the vaginal aspect of the LAVH, final laparoscopy was performed to assess abdominal pedicles. Pneumoperitoneum was re-created with CO2 gas.  The laparoscopy was notable for good hemostasis being appreciated on all pedicles.  The procedure was then terminated with all instrumentation being removed from the abdomen.  The 3 ports were removed along with the release of the pneumoperitoneum.  The incisions were closed with 4-0 Monocryl suture in a simple interrupted manner.  Dermabond glue was placed over the incisions.  The patient was then awakened extubated and taken recovery room in satisfactory condition.  Higinio Grow A. Zipporah Plants, MD, ACOG [facility]

## 2017-05-14 ENCOUNTER — Encounter: Payer: Self-pay | Admitting: Obstetrics and Gynecology

## 2017-05-14 DIAGNOSIS — F419 Anxiety disorder, unspecified: Secondary | ICD-10-CM | POA: Diagnosis not present

## 2017-05-14 DIAGNOSIS — Z79899 Other long term (current) drug therapy: Secondary | ICD-10-CM | POA: Diagnosis not present

## 2017-05-14 DIAGNOSIS — K219 Gastro-esophageal reflux disease without esophagitis: Secondary | ICD-10-CM | POA: Diagnosis not present

## 2017-05-14 DIAGNOSIS — N946 Dysmenorrhea, unspecified: Secondary | ICD-10-CM | POA: Diagnosis not present

## 2017-05-14 DIAGNOSIS — N83201 Unspecified ovarian cyst, right side: Secondary | ICD-10-CM | POA: Diagnosis not present

## 2017-05-14 DIAGNOSIS — N72 Inflammatory disease of cervix uteri: Secondary | ICD-10-CM | POA: Diagnosis not present

## 2017-05-14 DIAGNOSIS — N939 Abnormal uterine and vaginal bleeding, unspecified: Secondary | ICD-10-CM | POA: Diagnosis not present

## 2017-05-14 DIAGNOSIS — N809 Endometriosis, unspecified: Secondary | ICD-10-CM | POA: Diagnosis not present

## 2017-05-14 DIAGNOSIS — N83202 Unspecified ovarian cyst, left side: Secondary | ICD-10-CM | POA: Diagnosis not present

## 2017-05-14 LAB — HEMOGLOBIN: HEMOGLOBIN: 13 g/dL (ref 12.0–16.0)

## 2017-05-14 LAB — SURGICAL PATHOLOGY

## 2017-05-14 MED ORDER — DOCUSATE SODIUM 100 MG PO CAPS
100.0000 mg | ORAL_CAPSULE | Freq: Two times a day (BID) | ORAL | 0 refills | Status: DC
Start: 2017-05-14 — End: 2017-05-21

## 2017-05-14 MED ORDER — IBUPROFEN 800 MG PO TABS: 800.0000 mg | ORAL_TABLET | Freq: Three times a day (TID) | ORAL | 1 refills | Status: DC

## 2017-05-14 MED ORDER — OXYCODONE-ACETAMINOPHEN 5-325 MG PO TABS: 1.0000 | ORAL_TABLET | ORAL | 0 refills | Status: DC | PRN

## 2017-05-14 NOTE — Discharge Summary (Signed)
Physician Discharge Summary  Patient ID: Rebecca Evans MRN: [id number] DOB/AGE: 1986/02/28 31 y.o.  Admit date: 05/13/2017 Discharge date: 05/14/2017  Admission Diagnoses: AUB on Nexplanon and Endometriosis  Discharge Diagnoses:  SAA  Operative Procedures: Procedure(s): LAPAROSCOPIC ASSISTED VAGINAL HYSTERECTOMY WITH BILATERAL SALPINGO OOPHORECTOMY (Bilateral) REMOVAL OF DRUG DELIVERY IMPLANT, LEFT ARM (Left)  Hospital Course: Uncomplicated.   Significant Diagnostic Studies:  Lab Results  Component Value Date   HGB 13.0 05/14/2017   HGB 14.4 05/09/2017   HGB 14.4 02/12/2017   Lab Results  Component Value Date   HCT 42.2 05/09/2017   HCT 41.6 02/12/2017   HCT 42.4 06/12/2016   CBC Latest Ref Rng & Units 05/14/2017 05/09/2017 02/12/2017  WBC 3.6 - 11.0 K/uL - 12.6(H) 9.7  Hemoglobin 12.0 - 16.0 g/dL 13.0 14.4 14.4  Hematocrit 35.0 - 47.0 % - 42.2 41.6  Platelets 150 - 440 K/uL - 355 379     Discharged Condition: good  Discharge Exam: Blood pressure 100/69, pulse 78, temperature 98.2 F (36.8 C), temperature source Oral, resp. rate 18, height 5\' 5"  (1.651 m), weight 190 lb (86.2 kg), SpO2 99 %. Incision/Wound: clean, dry and no drainage  Disposition: 01-Home or Self Care  Discharge Instructions    Call MD for:  difficulty breathing, headache or visual disturbances   Complete by:  As directed    Call MD for:  extreme fatigue   Complete by:  As directed    Call MD for:  hives   Complete by:  As directed    Call MD for:  persistant dizziness or light-headedness   Complete by:  As directed    Call MD for:  persistant nausea and vomiting   Complete by:  As directed    Call MD for:  redness, tenderness, or signs of infection (pain, swelling, redness, odor or green/yellow discharge around incision site)   Complete by:  As directed    Call MD for:  severe uncontrolled pain   Complete by:  As directed    Call MD for:  temperature >100.4   Complete by:  As  directed    Diet - low sodium heart healthy   Complete by:  As directed    Increase activity slowly   Complete by:  As directed      Allergies as of 05/14/2017   No Known Allergies     Medication List    STOP taking these medications   lansoprazole 30 MG capsule Commonly known as:  PREVACID     TAKE these medications   acetaminophen 500 MG tablet Commonly known as:  TYLENOL Take 1,000 mg by mouth 2 (two) times daily as needed for moderate pain or headache.   CRANBERRY PO Take 2 tablets by mouth daily.   docusate sodium 100 MG capsule Commonly known as:  COLACE Take 1 capsule (100 mg total) by mouth 2 (two) times daily.   escitalopram 10 MG tablet Commonly known as:  LEXAPRO Take 1 tablet (10 mg total) daily by mouth. What changed:  when to take this   ibuprofen 800 MG tablet Commonly known as:  ADVIL,MOTRIN Take 1 tablet (800 mg total) by mouth 3 (three) times daily.   LYSINE PO Take 1 tablet by mouth daily.   MULTIVITAMIN GUMMIES ADULT PO Take 2 tablets by mouth daily.   oxyCODONE-acetaminophen 5-325 MG tablet Commonly known as:  PERCOCET/ROXICET Take 1-2 tablets by mouth every 4 (four) hours as needed (moderate to severe pain (when tolerating fluids)).  Follow-up Information    Defrancesco, Alanda Slim, MD. Go in 1 week(s).   Specialties:  Obstetrics and Gynecology, Radiology Why:  Post Op Check Contact information: [place] [address] [phone]           Signed: Alanda Slim Defrancesco 05/14/2017, 10:21 AM

## 2017-05-14 NOTE — Progress Notes (Signed)
Patient discharged home via private vehicle escorted via volunteer and husband. Discharge instructions reviewed with patient. Medications available for pickup at out patient pharmacy. IV removed without complication. Patient voiding s/p foley removal.

## 2017-05-21 ENCOUNTER — Ambulatory Visit (INDEPENDENT_AMBULATORY_CARE_PROVIDER_SITE_OTHER): Payer: 59 | Admitting: Obstetrics and Gynecology

## 2017-05-21 ENCOUNTER — Encounter: Payer: 59 | Admitting: Obstetrics and Gynecology

## 2017-05-21 ENCOUNTER — Encounter: Payer: Self-pay | Admitting: Obstetrics and Gynecology

## 2017-05-21 VITALS — BP 118/79 | HR 81 | Ht 65.0 in | Wt 178.2 lb

## 2017-05-21 DIAGNOSIS — N809 Endometriosis, unspecified: Secondary | ICD-10-CM

## 2017-05-21 DIAGNOSIS — R300 Dysuria: Secondary | ICD-10-CM | POA: Diagnosis not present

## 2017-05-21 DIAGNOSIS — N939 Abnormal uterine and vaginal bleeding, unspecified: Secondary | ICD-10-CM

## 2017-05-21 DIAGNOSIS — Z90710 Acquired absence of both cervix and uterus: Secondary | ICD-10-CM

## 2017-05-21 DIAGNOSIS — E8941 Symptomatic postprocedural ovarian failure: Secondary | ICD-10-CM

## 2017-05-21 DIAGNOSIS — Z09 Encounter for follow-up examination after completed treatment for conditions other than malignant neoplasm: Secondary | ICD-10-CM

## 2017-05-21 LAB — POCT URINALYSIS DIPSTICK
Glucose, UA: NEGATIVE
KETONES UA: NEGATIVE
NITRITE UA: NEGATIVE
ODOR: NEGATIVE
PROTEIN UA: NEGATIVE
SPEC GRAV UA: 1.02 (ref 1.010–1.025)
Urobilinogen, UA: 0.2 E.U./dL
pH, UA: 6 (ref 5.0–8.0)

## 2017-05-21 MED ORDER — ESTRADIOL 0.1 MG/24HR TD PTTW: 1.0000 | MEDICATED_PATCH | TRANSDERMAL | 12 refills | Status: DC

## 2017-05-21 NOTE — Progress Notes (Signed)
Chief complaint: 1.  1 week postop check 2.  Status post LAVH BSO for symptomatic endometriosis, and Nexplanon removal 3.  Vasomotor symptoms  Patient presents for 1 week postop check.  She reports normal bowel and bladder function.  She is noting slight vaginal spotting.  No significant pelvic pain or abdominal pain.  Minimal analgesic medication is being used at this time. Most significant problem is new onset vasomotor symptoms.  She is not currently on any estrogen replacement therapy  Pathology from surgery: DIAGNOSIS:  A. UTERUS WITH CERVIX AND BILATERAL FALLOPIAN TUBES AND OVARIES; TOTAL  HYSTERECTOMY WITH BILATERAL SALPINGO-OOPHORECTOMY:  - CHRONIC CERVICITIS.  - PROLIFERATIVE ENDOMETRIUM.  - UNREMAKABLE MYOMETRIUM.  - BILATERAL OVARIES WITH CYSTIC FOLLICLES.  - BILATERAL FALLOPIAN TUBE WITH FIMBRIATED END.  - NEGATIVE FOR ATYPIA AND MALIGNANCY.   OBJECTIVE: BP 118/79   Pulse 81   Ht 5\' 5"  (1.651 m)   Wt 178 lb 3.2 oz (80.8 kg)   LMP  (LMP Unknown)   BMI 29.65 kg/m  Pleasant well-appearing female in no acute distress.  Alert and oriented. Left arm-Nexplanon removal site is healing with small eschar present; Band-Aid coverage is given. Abdomen-soft, nontender; laparoscopy port sites are well approximated and healing without evidence of inflammation or hernia Pelvic exam-deferred  ASSESSMENT: 1.  Normal 1 week postop check 2.  Status post LAVH BSO for symptomatic endometriosis, and Nexplanon removal 3.  Surgical menopause, symptomatic  PLAN: 1.  Continue with routine postop precautions 2.  Begin Vivelle dot 0.1 mg patch twice weekly 3.  Recommend calcium and vitamin D supplementation twice a day 4.  Return in 5 weeks for final postop check 5.  Pros and cons of ERT were reviewed; questions answered.  Brayton Mars, MD  Note: This dictation was prepared with Dragon dictation along with smaller phrase technology. Any transcriptional errors that result from this  process are unintentional.

## 2017-05-21 NOTE — Patient Instructions (Addendum)
1.  Gradually resume activities as tolerated 2.  Begin using Vivelle dot 0.1 mg patch twice weekly 3.  Return in 5 weeks for final postop check 4.  Begin calcium 600 mg D4 100 international units twice daily 5.  Calcium options include:  Citracal  Os-Cal  Viactiv chews  TUMS

## 2017-05-23 LAB — URINE CULTURE

## 2017-05-24 ENCOUNTER — Other Ambulatory Visit: Payer: Self-pay | Admitting: Obstetrics and Gynecology

## 2017-05-24 DIAGNOSIS — N390 Urinary tract infection, site not specified: Secondary | ICD-10-CM | POA: Insufficient documentation

## 2017-05-24 MED ORDER — NITROFURANTOIN MONOHYD MACRO 100 MG PO CAPS: 100.0000 mg | ORAL_CAPSULE | Freq: Two times a day (BID) | ORAL | 0 refills | Status: DC

## 2017-06-19 ENCOUNTER — Ambulatory Visit (INDEPENDENT_AMBULATORY_CARE_PROVIDER_SITE_OTHER): Payer: 59 | Admitting: Obstetrics and Gynecology

## 2017-06-19 ENCOUNTER — Encounter: Payer: Self-pay | Admitting: Obstetrics and Gynecology

## 2017-06-19 VITALS — BP 115/81 | HR 98 | Ht 65.0 in | Wt 183.7 lb

## 2017-06-19 DIAGNOSIS — H5213 Myopia, bilateral: Secondary | ICD-10-CM | POA: Diagnosis not present

## 2017-06-19 DIAGNOSIS — R309 Painful micturition, unspecified: Secondary | ICD-10-CM

## 2017-06-19 DIAGNOSIS — Z90710 Acquired absence of both cervix and uterus: Secondary | ICD-10-CM

## 2017-06-19 DIAGNOSIS — Z09 Encounter for follow-up examination after completed treatment for conditions other than malignant neoplasm: Secondary | ICD-10-CM

## 2017-06-19 DIAGNOSIS — L929 Granulomatous disorder of the skin and subcutaneous tissue, unspecified: Secondary | ICD-10-CM

## 2017-06-19 DIAGNOSIS — N939 Abnormal uterine and vaginal bleeding, unspecified: Secondary | ICD-10-CM

## 2017-06-19 LAB — POCT URINALYSIS DIPSTICK
BILIRUBIN UA: NEGATIVE
Glucose, UA: NEGATIVE
Ketones, UA: NEGATIVE
NITRITE UA: NEGATIVE
ODOR: NEGATIVE
PH UA: 7 (ref 5.0–8.0)
PROTEIN UA: NEGATIVE
Spec Grav, UA: 1.01 (ref 1.010–1.025)
UROBILINOGEN UA: 0.2 U/dL

## 2017-06-19 NOTE — Progress Notes (Signed)
Chief complaint: 1.  Final postop check 2.  Vaginal bleeding  Rebecca Evans presents today for 5-week postop check following LAVH BSO for management of symptomatic endometriosis and following Nexplanon removal. Bowel and bladder function are normal. Pelvic pain is notable for mild cramping, especially noted during the first day of estrogen patch application with a sense of menstrual cramps starting up.  These resolve over the next several days. Patient does report some vaginal spotting. She also is experiencing some UTI type symptoms with frequency and urgency and dysuria.  Surgical pathology: DIAGNOSIS:  A. UTERUS WITH CERVIX AND BILATERAL FALLOPIAN TUBES AND OVARIES; TOTAL  HYSTERECTOMY WITH BILATERAL SALPINGO-OOPHORECTOMY:  - CHRONIC CERVICITIS.  - PROLIFERATIVE ENDOMETRIUM.  - UNREMAKABLE MYOMETRIUM.  - BILATERAL OVARIES WITH CYSTIC FOLLICLES.  - BILATERAL FALLOPIAN TUBE WITH FIMBRIATED END.  - NEGATIVE FOR ATYPIA AND MALIGNANCY.   OBJECTIVE: BP 115/81   Pulse 98   Ht 5\' 5"  (1.651 m)   Wt 183 lb 11.2 oz (83.3 kg)   LMP  (LMP Unknown)   BMI 30.57 kg/m  Pleasant female no acute distress.  Alert and oriented. Left arm: Nexplanon removal site is well-healed Abdomen: Soft, nontender Pelvic exam: External genitalia-normal BUS-normal Vagina-good vault support; granulation tissue noted at vaginal cuff (excised with tissular forceps-not sent for pathology); Monsel solution is applied to facilitate hemostasis Bimanual-deferred Rectovaginal-normal external exam  ASSESSMENT: 1.  Granulation tissue of the vaginal cuff 2.  Status post LAVH BSO for symptomatic endometriosis 3.  UTI symptoms  PLAN: 1.  Granulation tissue removed with tissular forceps 2.  Monsel solution is applied to the vaginal cuff bleeding site 3.  UA with CNS is ordered 4.  Return in 2 weeks for follow-up 5.  Continue using estradiol patches twice weekly.  Brayton Mars, MD  Note: This dictation was  prepared with Dragon dictation along with smaller phrase technology. Any transcriptional errors that result from this process are unintentional.

## 2017-06-19 NOTE — Patient Instructions (Signed)
1.  Scar tissue in the vagina is removed today.  Monsel solution is applied to scar tissue area to help healing 2.  Continue using estradiol patches for estrogen replacement therapy 3.  Return in 2 weeks for recheck on vaginal spotting

## 2017-06-21 LAB — URINE CULTURE

## 2017-06-25 ENCOUNTER — Telehealth: Payer: Self-pay

## 2017-06-25 MED ORDER — AMOXICILLIN 500 MG PO CAPS: 500.0000 mg | ORAL_CAPSULE | Freq: Two times a day (BID) | ORAL | 0 refills | Status: DC

## 2017-06-25 NOTE — Telephone Encounter (Signed)
-----   Message from Brayton Mars, MD sent at 06/25/2017  8:13 AM EST ----- Please notify - Abnormal Labs Please call in amoxicillin 500 mg twice a day for 7 days

## 2017-06-25 NOTE — Telephone Encounter (Signed)
Pt aware. Med erx. 

## 2017-07-03 ENCOUNTER — Encounter: Payer: Self-pay | Admitting: Obstetrics and Gynecology

## 2017-07-03 ENCOUNTER — Ambulatory Visit (INDEPENDENT_AMBULATORY_CARE_PROVIDER_SITE_OTHER): Payer: 59 | Admitting: Obstetrics and Gynecology

## 2017-07-03 VITALS — BP 104/68 | HR 82 | Ht 65.0 in | Wt 183.7 lb

## 2017-07-03 DIAGNOSIS — Z90710 Acquired absence of both cervix and uterus: Secondary | ICD-10-CM

## 2017-07-03 DIAGNOSIS — E8941 Symptomatic postprocedural ovarian failure: Secondary | ICD-10-CM

## 2017-07-03 DIAGNOSIS — N390 Urinary tract infection, site not specified: Secondary | ICD-10-CM | POA: Diagnosis not present

## 2017-07-03 DIAGNOSIS — N809 Endometriosis, unspecified: Secondary | ICD-10-CM

## 2017-07-03 DIAGNOSIS — L929 Granulomatous disorder of the skin and subcutaneous tissue, unspecified: Secondary | ICD-10-CM

## 2017-07-03 LAB — POCT URINALYSIS DIPSTICK
BILIRUBIN UA: NEGATIVE
GLUCOSE UA: NEGATIVE
Ketones, UA: NEGATIVE
LEUKOCYTES UA: NEGATIVE
Nitrite, UA: NEGATIVE
Odor: NEGATIVE
Protein, UA: NEGATIVE
RBC UA: NEGATIVE
SPEC GRAV UA: 1.01 (ref 1.010–1.025)
Urobilinogen, UA: 0.2 E.U./dL
pH, UA: 7 (ref 5.0–8.0)

## 2017-07-03 NOTE — Progress Notes (Signed)
Chief complaint: 1.  Recurrent UTI 2.  Granulation tissue follow-up 3.  Surgical menopause  Rebecca Evans presents for 2-week follow-up.  She was seen for excision of granulation tissue and Monsel solution application following her hysterectomy 2 weeks ago.  She has been started on estrogen replacement therapy for surgical menopause.  She feels well without vasomotor symptoms. Rebecca Evans did have a GBS UTI on 06/19/2017 which was treated. She also had a UTI on 05/21/2017 caused by E. coli and enterococcus, which was also treated. She is not experiencing any UTI symptoms today.  Past medical history, past surgical history, problem list, medications, and allergies are reviewed  OBJECTIVE: BP 104/68   Pulse 82   Ht 5\' 5"  (1.651 m)   Wt 183 lb 11.2 oz (83.3 kg)   LMP  (LMP Unknown)   BMI 30.57 kg/m  Pleasant well-appearing female no acute distress.  Alert and oriented. Abdomen: Soft, nontender Pelvic exam: External genitalia-normal BUS-normal Vagina-good vault support; vaginal cuff intact; 2 three Mm spots of granulation tissue are identified; Monsel solution is applied.  ASSESSMENT: 1.  Surgical menopause, with excellent response to Vivelle dot 0.1 mg patch twice weekly 2.  Minimal residual granulation tissue, treated with Monsel solution 3.  History of UTI x2 post surgery, treated, test of cure is to be completed today. 4.  History of endometriosis status post definitive surgery  PLAN: 1.  Monsel solution applied to residual granulation tissue 2.  Resume all activities after 72 hours 3.  Urinalysis and urine culture are obtained today 4.  Continue with Vivelle dot 0.1 mg patch twice weekly 5.  Return in 1 year for annual exam or sooner if vaginal spotting or pelvic pain presents.  A total of 15 minutes were spent face-to-face with the patient during this encounter and over half of that time dealt with counseling and coordination of care.  Brayton Mars, MD  Note: This dictation  was prepared with Dragon dictation along with smaller phrase technology. Any transcriptional errors that result from this process are unintentional.

## 2017-07-03 NOTE — Patient Instructions (Addendum)
1.  Monsel solution is applied to the vaginal cuff residual granulation tissue 2.  Expect a vaginal discharge for 2 or 3 days 3.  Return for any spotting or pelvic pain as needed 4.  Return for annual exam in 1 year 5.  Continue with surgical menopause estrogen replacement therapy as written. 6.  UA with CNS is obtained to rule out residual UTI

## 2017-07-05 LAB — URINE CULTURE: Organism ID, Bacteria: NO GROWTH

## 2017-07-31 ENCOUNTER — Ambulatory Visit (INDEPENDENT_AMBULATORY_CARE_PROVIDER_SITE_OTHER): Payer: Self-pay | Admitting: Family Medicine

## 2017-07-31 VITALS — BP 108/67 | HR 84 | Temp 98.3°F | Wt 182.0 lb

## 2017-07-31 DIAGNOSIS — R05 Cough: Secondary | ICD-10-CM

## 2017-07-31 DIAGNOSIS — R52 Pain, unspecified: Secondary | ICD-10-CM

## 2017-07-31 DIAGNOSIS — R059 Cough, unspecified: Secondary | ICD-10-CM

## 2017-07-31 DIAGNOSIS — R69 Illness, unspecified: Secondary | ICD-10-CM

## 2017-07-31 DIAGNOSIS — J111 Influenza due to unidentified influenza virus with other respiratory manifestations: Secondary | ICD-10-CM

## 2017-07-31 LAB — POCT INFLUENZA A/B
INFLUENZA A, POC: NEGATIVE
Influenza B, POC: NEGATIVE

## 2017-07-31 MED ORDER — OSELTAMIVIR PHOSPHATE 75 MG PO CAPS
75.0000 mg | ORAL_CAPSULE | Freq: Two times a day (BID) | ORAL | 0 refills | Status: AC
Start: 2017-07-31 — End: 2017-08-05

## 2017-07-31 MED ORDER — GUAIFENESIN ER 600 MG PO TB12: 600.0000 mg | ORAL_TABLET | Freq: Two times a day (BID) | ORAL | 0 refills | Status: AC | PRN

## 2017-07-31 MED ORDER — IBUPROFEN 600 MG PO TABS: 600.0000 mg | ORAL_TABLET | Freq: Three times a day (TID) | ORAL | 0 refills | Status: AC | PRN

## 2017-07-31 NOTE — Progress Notes (Signed)
Rebecca Evans is a 32 y.o. female who  complains of body aches, fever and chills and sinus congestion. Symptoms include congestion, cough and fever. Onset of symptoms was 2 days ago, she reports using OTC tylenol for fever and chills. She confirms home contacts with similar symptoms of her daughter.  Review of Systems  Constitutional: Positive for chills, fever and malaise/fatigue. Negative for diaphoresis and weight loss.  HENT: Positive for congestion. Negative for sinus pain and sore throat.   Eyes: Negative.   Respiratory: Positive for cough. Negative for hemoptysis, sputum production, shortness of breath and stridor.   Cardiovascular: Negative.   Gastrointestinal: Negative for abdominal pain, diarrhea, heartburn, nausea and vomiting.  Genitourinary: Negative for dysuria and urgency.  Musculoskeletal: Negative for myalgias and neck pain.  Skin: Negative.   Neurological: Negative for weakness and headaches.  Endo/Heme/Allergies: Negative.   Psychiatric/Behavioral: Negative.     O: Vitals:   07/31/17 1027  BP: 108/67  Pulse: 84  Temp: 98.3 F (36.8 C)  SpO2: 95%   Physical Exam  Constitutional: Vital signs are normal. She appears well-developed and well-nourished.  Non-toxic appearance. She does not have a sickly appearance. She appears ill. No distress.  HENT:  Right Ear: Hearing, tympanic membrane, external ear and ear canal normal.  Left Ear: Hearing, tympanic membrane, external ear and ear canal normal.  Nose: Rhinorrhea present.  Mouth/Throat: Uvula is midline. Posterior oropharyngeal erythema present. Tonsils are 2+ on the right. Tonsils are 2+ on the left. No tonsillar exudate.  Eyes: Pupils are equal, round, and reactive to light.  Neck: Normal range of motion. Neck supple.  Cardiovascular: Normal rate and regular rhythm.  Pulmonary/Chest: Effort normal and breath sounds normal. No stridor. No respiratory distress.  Abdominal: Soft. Bowel sounds are normal. She  exhibits no distension. There is no tenderness. There is no guarding.  Lymphadenopathy:    She has cervical adenopathy.       Right cervical: Superficial cervical adenopathy present.  Skin: Skin is warm.  Psychiatric: Her speech is normal and behavior is normal.  Vitals reviewed.     A: 1. Body aches   2. Influenza-like illness   3. Cough      P: 1. Body aches - POCT Influenza A/B - ibuprofen (ADVIL,MOTRIN) 600 MG tablet; Take 1 tablet (600 mg total) by mouth every 8 (eight) hours as needed for up to 10 days for fever (body aches).  2. Influenza-like illness - oseltamivir (TAMIFLU) 75 MG capsule; Take 1 capsule (75 mg total) by mouth 2 (two) times daily for 5 days.  3. Cough - guaiFENesin (MUCINEX) 600 MG 12 hr tablet; Take 1 tablet (600 mg total) by mouth 2 (two) times daily as needed for up to 5 days for cough or to loosen phlegm.  PLAN: Work note for 48 hours provided- advised to f/u in clinic if symptoms persist Discussed plan of care, medication use, indications and usual side effects

## 2017-07-31 NOTE — Patient Instructions (Signed)

## 2017-08-05 ENCOUNTER — Telehealth: Payer: Self-pay | Admitting: Emergency Medicine

## 2017-08-05 NOTE — Telephone Encounter (Signed)
Follow up call from visit with [facility] provider. Per patient doing much better. Still have some congestion.

## 2017-09-03 DIAGNOSIS — S63591A Other specified sprain of right wrist, initial encounter: Secondary | ICD-10-CM | POA: Diagnosis not present

## 2017-09-05 IMAGING — NM NM HEPATO W/GB/PHARM/[PERSON_NAME]
2 series · 12 of 12 positions shown · non-contrast
Comparison: None

CLINICAL DATA: Chronic epigastric abdominal pain, epigastric and
RIGHT pain with nausea and acid reflux for 1 week

EXAM:
NUCLEAR MEDICINE HEPATOBILIARY IMAGING WITH GALLBLADDER EF
TECHNIQUE: Sequential images of the abdomen were obtained [DATE] minutes
following intravenous administration of radiopharmaceutical. After
oral ingestion of Ensure, gallbladder ejection fraction was
determined. At 60 min, normal ejection fraction is greater than 33%.
RADIOPHARMACEUTICALS:  4.925 mCi Yc-IIm  Choletec IV

[Series 1000: hepatobiliary scan · 9.59mm/px · 6 of 58 frames shown]
[frame 5/58]
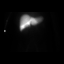
[frame 15/58]
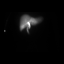
[frame 25/58]
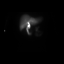
[frame 34/58]
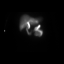
[frame 44/58]
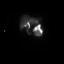
[frame 54/58]
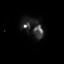

[Series 1000: gallbladder ef · 4.80mm/px · 6 of 120 frames shown]
[frame 11/120]
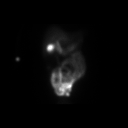
[frame 31/120]
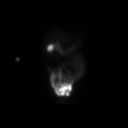
[frame 51/120]
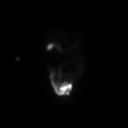
[frame 71/120]
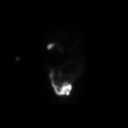
[frame 91/120]
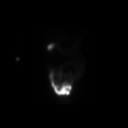
[frame 111/120]
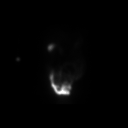

[12 of 12 positions shown; findings below may reference images not displayed]

FINDINGS: Normal tracer extraction from bloodstream indicating normal
hepatocellular function.

Normal excretion of tracer into biliary tree.

Gallbladder visualized at 48 min.

Small bowel visualized at 13 min.

No hepatic retention of tracer.

Subjectively normal emptying of tracer from gallbladder following
fatty meal stimulation.

Calculated gallbladder ejection fraction is 45%, normal.

Patient reported RIGHT upper quadrant pain following Ensure
ingestion.

Normal gallbladder ejection fraction following Ensure ingestion is
greater than 33% at 1 hour.
IMPRESSION: Patent biliary tree.

Normal gallbladder response to fatty meal stimulation with a normal
gallbladder ejection fraction of 45%.

RIGHT upper quadrant pain following Ensure ingestion.

## 2017-09-26 ENCOUNTER — Encounter: Payer: Self-pay | Admitting: Family Medicine

## 2017-09-26 ENCOUNTER — Ambulatory Visit (INDEPENDENT_AMBULATORY_CARE_PROVIDER_SITE_OTHER): Payer: Self-pay | Admitting: Family Medicine

## 2017-09-26 VITALS — BP 110/80 | HR 68 | Temp 98.1°F | Wt 185.0 lb

## 2017-09-26 DIAGNOSIS — R109 Unspecified abdominal pain: Secondary | ICD-10-CM

## 2017-09-26 DIAGNOSIS — R300 Dysuria: Secondary | ICD-10-CM

## 2017-09-26 LAB — POCT URINALYSIS DIPSTICK
BILIRUBIN UA: NEGATIVE
Blood, UA: NEGATIVE
Glucose, UA: NEGATIVE
Leukocytes, UA: NEGATIVE
Nitrite, UA: NEGATIVE
PH UA: 5.5 (ref 5.0–8.0)
Protein, UA: NEGATIVE
Spec Grav, UA: 1.01 (ref 1.010–1.025)
UROBILINOGEN UA: 0.2 U/dL

## 2017-09-26 NOTE — Progress Notes (Signed)
Patient ID: Rebecca Evans, female    DOB: 11-Sep-1985, 32 y.o.   MRN: [id number]  PCP: Joylene Igo, CNM  Chief Complaint  Patient presents with  . choice/dysuria/burning x6d    Subjective:  HPI Rebecca Evans is a 32 y.o. female with a history of hysterectomy secondary to endometriosis, chronic UTI, presents for evaluation of 6 days of dysuria.  Jeylin complains of right-sided flank pain which is been present consistently with dysuria over 6 days. Right flank pain is exacerbated by urge to urinate and is relieved temporarily after urination.  She is unable to decipher whether or not she is experiencing urinary frequency as she consumes large volumes of water daily.  She denies vaginal discharge, excoriation, or lesions.  She denies urine odor, urine color changes, fever, chills, nausea, or vomiting.  Social History   Socioeconomic History  . Marital status: Married    Spouse name: Not on file  . Number of children: Not on file  . Years of education: Not on file  . Highest education level: Not on file  Occupational History  . Not on file  Social Needs  . Financial resource strain: Not on file  . Food insecurity:    Worry: Not on file    Inability: Not on file  . Transportation needs:    Medical: Not on file    Non-medical: Not on file  Tobacco Use  . Smoking status: Never Smoker  . Smokeless tobacco: Never Used  Substance and Sexual Activity  . Alcohol use: No  . Drug use: No  . Sexual activity: Yes    Birth control/protection: None, Surgical  Lifestyle  . Physical activity:    Days per week: Not on file    Minutes per session: Not on file  . Stress: Not on file  Relationships  . Social connections:    Talks on phone: Not on file    Gets together: Not on file    Attends religious service: Not on file    Active member of club or organization: Not on file    Attends meetings of clubs or organizations: Not on file    Relationship status: Not on file   . Intimate partner violence:    Fear of current or ex partner: Not on file    Emotionally abused: Not on file    Physically abused: Not on file    Forced sexual activity: Not on file  Other Topics Concern  . Not on file  Social History Narrative  . Not on file    Family History  Problem Relation Age of Onset  . Diabetes Maternal Grandmother   . Diabetes Maternal Grandfather    Review of Systems Pertinent negatives indicated in HPI Patient Active Problem List   Diagnosis Date Noted  . UTI (urinary tract infection) 05/24/2017  . Surgical menopause, symptomatic 05/21/2017  . 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 Endometriosis determined by laparoscopy 05/01/2017  . Dysmenorrhea 05/01/2017  . Dyspareunia, female 05/01/2017  . Abnormal uterine bleeding 05/01/2017    No Known Allergies  Prior to Admission medications   Medication Sig Start Date End Date Taking? Authorizing Provider  calcium-vitamin D (OSCAL WITH D) 500-200 MG-UNIT tablet Take 1 tablet by mouth.   Yes [provider]  escitalopram (LEXAPRO) 10 MG tablet Take 1 tablet (10 mg total) daily by mouth. Patient taking differently: Take 10 mg by mouth every morning.  04/22/17  Yes Shambley, Melody N, CNM  estradiol (VIVELLE-DOT) 0.1 MG/24HR patch  Place 1 patch (0.1 mg total) onto the skin 2 (two) times a week. 05/23/17  Yes Defrancesco, Alanda Slim, MD  lansoprazole (PREVACID) 30 MG capsule  04/22/17  Yes [provider]  CRANBERRY PO Take 2 tablets by mouth daily.    [provider]  LYSINE PO Take 1 tablet by mouth daily.    [provider]  Multiple Vitamins-Minerals (MULTIVITAMIN GUMMIES ADULT PO) Take 2 tablets by mouth daily.    [provider]    Past Medical, Surgical Family and Social History reviewed and updated.    Objective:   Today's Vitals   09/26/17 1158  BP: 110/80  Pulse: 68  Temp: 98.1 F (36.7 C)  SpO2: 98%  Weight: 185 lb (83.9 kg)    Wt Readings from Last  3 Encounters:  09/26/17 185 lb (83.9 kg)  07/31/17 182 lb (82.6 kg)  07/03/17 183 lb 11.2 oz (83.3 kg)    Physical Exam Deferred  Assessment & Plan:  1. Dysuria - POCT Urinalysis Dipstick, urine dipstick negative of RBCs or leukocytes.  Patient reports she typically has urine cultures which return positive.  Explained that this is an acute care clinic and urine culture nor lab testing capability is not accessible at this clinic.  I did advise that she is having flank pain to follow-up ASAP for further evaluation since symptoms have been persistent for complete 6 days.  There is likely some underlying issue going on which may warrant a urology evaluation.  Patient verbalized understanding and will follow-up with PCP.  Patient does not appear toxic, she is afebrile, and ambulatory without visible discomfort.   If symptoms worsen or do not improve, return for follow-up, follow-up with PCP, or at the emergency department if severity of symptoms warrant a higher level of care.  Carroll Sage. Kenton Kingfisher, MSN, FNP-C [facility] [place], [place] [address] [phone]

## 2017-09-26 NOTE — Patient Instructions (Signed)
Follow-up with your PCP as the symptoms you are currently experiencing are inconsistent with a typical UTI, given your history of chronic UTI's, you will warrant more diagnostic testing and urine culture which are unavailable at [facility].    Dysuria Dysuria is pain or discomfort while urinating. The pain or discomfort may be felt in the tube that carries urine out of the bladder (urethra) or in the surrounding tissue of the genitals. The pain may also be felt in the groin area, lower abdomen, and lower back. You may have to urinate frequently or have the sudden feeling that you have to urinate (urgency). Dysuria can affect both men and women, but is more common in women. Dysuria can be caused by many different things, including:  Urinary tract infection in women.  Infection of the kidney or bladder.  Kidney stones or bladder stones.  Certain sexually transmitted infections (STIs), such as chlamydia.  Dehydration.  Inflammation of the vagina.  Use of certain medicines.  Use of certain soaps or scented products that cause irritation.  Follow these instructions at home: Watch your dysuria for any changes. The following actions may help to reduce any discomfort you are feeling:  Drink enough fluid to keep your urine clear or pale yellow.  Empty your bladder often. Avoid holding urine for long periods of time.  After a bowel movement or urination, women should cleanse from front to back, using each tissue only once.  Empty your bladder after sexual intercourse.  Take medicines only as directed by your health care provider.  If you were prescribed an antibiotic medicine, finish it all even if you start to feel better.  Avoid caffeine, tea, and alcohol. They can irritate the bladder and make dysuria worse. In men, alcohol may irritate the prostate.  Keep all follow-up visits as directed by your health care provider. This is important.  If you had any tests done to find the  cause of dysuria, it is your responsibility to obtain your test results. Ask the lab or department performing the test when and how you will get your results. Talk with your health care provider if you have any questions about your results.  Contact a health care provider if:  You develop pain in your back or sides.  You have a fever.  You have nausea or vomiting.  You have blood in your urine.  You are not urinating as often as you usually do. Get help right away if:  You pain is severe and not relieved with medicines.  You are unable to hold down any fluids.  You or someone else notices a change in your mental function.  You have a rapid heartbeat at rest.  You have shaking or chills.  You feel extremely weak. This information is not intended to replace advice given to you by your health care provider. Make sure you discuss any questions you have with your health care provider. Document Released: 02/17/2004 Document Revised: 10/27/2015 Document Reviewed: 01/14/2014 Elsevier Interactive Patient Education  [employer].

## 2018-01-10 ENCOUNTER — Other Ambulatory Visit: Payer: Self-pay | Admitting: Obstetrics and Gynecology

## 2018-01-23 ENCOUNTER — Ambulatory Visit (INDEPENDENT_AMBULATORY_CARE_PROVIDER_SITE_OTHER): Payer: Self-pay | Admitting: Family Medicine

## 2018-01-23 ENCOUNTER — Encounter: Payer: Self-pay | Admitting: Family Medicine

## 2018-01-23 VITALS — HR 99 | Temp 98.1°F | Wt 190.0 lb

## 2018-01-23 DIAGNOSIS — N898 Other specified noninflammatory disorders of vagina: Secondary | ICD-10-CM

## 2018-01-23 DIAGNOSIS — J0111 Acute recurrent frontal sinusitis: Secondary | ICD-10-CM

## 2018-01-23 DIAGNOSIS — R300 Dysuria: Secondary | ICD-10-CM

## 2018-01-23 LAB — POCT URINALYSIS DIPSTICK
BILIRUBIN UA: NEGATIVE
Glucose, UA: NEGATIVE
Ketones, UA: NEGATIVE
Leukocytes, UA: NEGATIVE
NITRITE UA: NEGATIVE
PH UA: 5 (ref 5.0–8.0)
Protein, UA: NEGATIVE
RBC UA: NEGATIVE
Spec Grav, UA: 1.02 (ref 1.010–1.025)
UROBILINOGEN UA: NEGATIVE U/dL — AB

## 2018-01-23 MED ORDER — FLUCONAZOLE 150 MG PO TABS: 150.0000 mg | ORAL_TABLET | ORAL | 0 refills | Status: DC | PRN

## 2018-01-23 MED ORDER — LEVOCETIRIZINE DIHYDROCHLORIDE 5 MG PO TABS: 5.0000 mg | ORAL_TABLET | Freq: Every evening | ORAL | 0 refills | Status: DC

## 2018-01-23 MED ORDER — DOXYCYCLINE HYCLATE 100 MG PO CAPS: 100.0000 mg | ORAL_CAPSULE | Freq: Two times a day (BID) | ORAL | 0 refills | Status: DC

## 2018-01-23 MED ORDER — BENZONATATE 100 MG PO CAPS: 100.0000 mg | ORAL_CAPSULE | Freq: Three times a day (TID) | ORAL | 0 refills | Status: DC | PRN

## 2018-01-23 NOTE — Patient Instructions (Addendum)
Levocetrizine 10 mg once daily at bedtime.  Doxycyline 1 tablet every 12 hours x 10 days.   Take Diflucan 150 mg for vaginal irritation.  Take Benzonatate 100-200 mg 3 times daily as needed.     Urinary symptoms- you have a negative urine analysis today. As this has been a reoccurring issue, I strongly recommend that you follow-up with your primary care provider for additional work-up and evaluation.   Upper Respiratory Infection, Adult Most upper respiratory infections (URIs) are caused by a virus. A URI affects the nose, throat, and upper air passages. The most common type of URI is often called "the common cold." Follow these instructions at home:  Take medicines only as told by your doctor.  Gargle warm saltwater or take cough drops to comfort your throat as told by your doctor.  Use a warm mist humidifier or inhale steam from a shower to increase air moisture. This may make it easier to breathe.  Drink enough fluid to keep your pee (urine) clear or pale yellow.  Eat soups and other clear broths.  Have a healthy diet.  Rest as needed.  Go back to work when your fever is gone or your doctor says it is okay. ? You may need to stay home longer to avoid giving your URI to others. ? You can also wear a face mask and wash your hands often to prevent spread of the virus.  Use your inhaler more if you have asthma.  Do not use any tobacco products, including cigarettes, chewing tobacco, or electronic cigarettes. If you need help quitting, ask your doctor. Contact a doctor if:  You are getting worse, not better.  Your symptoms are not helped by medicine.  You have chills.  You are getting more short of breath.  You have brown or red mucus.  You have yellow or brown discharge from your nose.  You have pain in your face, especially when you bend forward.  You have a fever.  You have puffy (swollen) neck glands.  You have pain while swallowing.  You have white areas  in the back of your throat. Get help right away if:  You have very bad or constant: ? Headache. ? Ear pain. ? Pain in your forehead, behind your eyes, and over your cheekbones (sinus pain). ? Chest pain.  You have long-lasting (chronic) lung disease and any of the following: ? Wheezing. ? Long-lasting cough. ? Coughing up blood. ? A change in your usual mucus.  You have a stiff neck.  You have changes in your: ? Vision. ? Hearing. ? Thinking. ? Mood. This information is not intended to replace advice given to you by your health care provider. Make sure you discuss any questions you have with your health care provider. Document Released: 11/07/2007 Document Revised: 01/22/2016 Document Reviewed: 08/26/2013 Elsevier Interactive Patient Education  2018 [employer].     Dysuria Dysuria is pain or discomfort while urinating. The pain or discomfort may be felt in the tube that carries urine out of the bladder (urethra) or in the surrounding tissue of the genitals. The pain may also be felt in the groin area, lower abdomen, and lower back. You may have to urinate frequently or have the sudden feeling that you have to urinate (urgency). Dysuria can affect both men and women, but is more common in women. Dysuria can be caused by many different things, including:  Urinary tract infection in women.  Infection of the kidney or bladder.  Kidney  stones or bladder stones.  Certain sexually transmitted infections (STIs), such as chlamydia.  Dehydration.  Inflammation of the vagina.  Use of certain medicines.  Use of certain soaps or scented products that cause irritation.  Follow these instructions at home: Watch your dysuria for any changes. The following actions may help to reduce any discomfort you are feeling:  Drink enough fluid to keep your urine clear or pale yellow.  Empty your bladder often. Avoid holding urine for long periods of time.  After a bowel movement or  urination, women should cleanse from front to back, using each tissue only once.  Empty your bladder after sexual intercourse.  Take medicines only as directed by your health care provider.  If you were prescribed an antibiotic medicine, finish it all even if you start to feel better.  Avoid caffeine, tea, and alcohol. They can irritate the bladder and make dysuria worse. In men, alcohol may irritate the prostate.  Keep all follow-up visits as directed by your health care provider. This is important.  If you had any tests done to find the cause of dysuria, it is your responsibility to obtain your test results. Ask the lab or department performing the test when and how you will get your results. Talk with your health care provider if you have any questions about your results.  Contact a health care provider if:  You develop pain in your back or sides.  You have a fever.  You have nausea or vomiting.  You have blood in your urine.  You are not urinating as often as you usually do. Get help right away if:  You pain is severe and not relieved with medicines.  You are unable to hold down any fluids.  You or someone else notices a change in your mental function.  You have a rapid heartbeat at rest.  You have shaking or chills.  You feel extremely weak. This information is not intended to replace advice given to you by your health care provider. Make sure you discuss any questions you have with your health care provider. Document Released: 02/17/2004 Document Revised: 10/27/2015 Document Reviewed: 01/14/2014 Elsevier Interactive Patient Education  [employer].

## 2018-01-23 NOTE — Progress Notes (Signed)
Patient ID: Rebecca Evans, female    DOB: 1985-08-31, 32 y.o.   MRN: [id number]  PCP: Joylene Igo, CNM  Chief Complaint  Patient presents with  . Dysuria  . URI    Subjective:  HPI Rebecca Evans is a 32 y.o. female presents for evaluation URI and dysuria.   SINUSITIS Onset: 4-5 days Facial/sinus pressure with discolored nasal mucus.    Severity: moderate Tried OTC meds without significant relief.  Symptoms:  + URI prodrome with nasal congestion + Minimal swollen neck glands + mild Sinus Headache + mild ear pressure  No Fever No Allergy symptoms No significant Sore Throat No eye symptoms     No significant Cough No chest pain No shortness of breath  No wheezing   Dysuria +Urine Frequency and itching +Attempted relief with Diflucan x 1 week as she suspected yeast. No improvement of symptoms. +Unknown if vaginal discharge present.  Remainder of Review of Systems negative except as noted in the HPI.  Social History   Socioeconomic History  . Marital status: Married    Spouse name: Not on file  . Number of children: Not on file  . Years of education: Not on file  . Highest education level: Not on file  Occupational History  . Not on file  Social Needs  . Financial resource strain: Not on file  . Food insecurity:    Worry: Not on file    Inability: Not on file  . Transportation needs:    Medical: Not on file    Non-medical: Not on file  Tobacco Use  . Smoking status: Never Smoker  . Smokeless tobacco: Never Used  Substance and Sexual Activity  . Alcohol use: No  . Drug use: No  . Sexual activity: Yes    Birth control/protection: None, Surgical  Lifestyle  . Physical activity:    Days per week: Not on file    Minutes per session: Not on file  . Stress: Not on file  Relationships  . Social connections:    Talks on phone: Not on file    Gets together: Not on file    Attends religious service: Not on file    Active member  of club or organization: Not on file    Attends meetings of clubs or organizations: Not on file    Relationship status: Not on file  . Intimate partner violence:    Fear of current or ex partner: Not on file    Emotionally abused: Not on file    Physically abused: Not on file    Forced sexual activity: Not on file  Other Topics Concern  . Not on file  Social History Narrative  . Not on file    Family History  Problem Relation Age of Onset  . Diabetes Maternal Grandmother   . Diabetes Maternal Grandfather    Review of Systems Pertinent negatives listed in HPI Patient Active Problem List   Diagnosis Date Noted  . UTI (urinary tract infection) 05/24/2017  . Surgical menopause, symptomatic 05/21/2017  . 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 Endometriosis determined by laparoscopy 05/01/2017  . Dysmenorrhea 05/01/2017  . Dyspareunia, female 05/01/2017  . Abnormal uterine bleeding 05/01/2017    No Known Allergies  Prior to Admission medications   Medication Sig Start Date End Date Taking? Authorizing Provider  calcium-vitamin D (OSCAL WITH D) 500-200 MG-UNIT tablet Take 1 tablet by mouth.   Yes [provider]  escitalopram (LEXAPRO) 10 MG tablet TAKE 1 TABLET  BY MOUTH ONCE DAILY 01/10/18  Yes Shambley, Melody N, CNM  estradiol (VIVELLE-DOT) 0.1 MG/24HR patch Place 1 patch (0.1 mg total) onto the skin 2 (two) times a week. 05/23/17  Yes Defrancesco, Alanda Slim, MD  lansoprazole (PREVACID) 30 MG capsule  04/22/17  Yes [provider]  CRANBERRY PO Take 2 tablets by mouth daily.    [provider]  LYSINE PO Take 1 tablet by mouth daily.    [provider]  Multiple Vitamins-Minerals (MULTIVITAMIN GUMMIES ADULT PO) Take 2 tablets by mouth daily.    [provider]    Past Medical, Surgical Family and Social History reviewed and updated.    Objective:   Today's Vitals   01/23/18 1146  Pulse: 99  Temp: 98.1 F (36.7 C)  SpO2: 99%  Weight: 190  lb (86.2 kg)    Wt Readings from Last 3 Encounters:  01/23/18 190 lb (86.2 kg)  09/26/17 185 lb (83.9 kg)  07/31/17 182 lb (82.6 kg)   Physical Exam Constitutional: Patient appears well-developed and well-nourished. No distress. HENT: Normocephalic, atraumatic, External right and left ear normal. Nasal mucosa edema present. Oropharynx is clear and moist.  Eyes: Conjunctivae and EOM are normal. PERRLA, no scleral icterus. Neck: Normal ROM. Neck supple. No JVD. No tracheal deviation. No thyromegaly. CVS: RRR, S1/S2 +, no murmurs, no gallops, no carotid bruit.  Pulmonary: Effort and breath sounds normal, audible non-cough on exam no stridor, rhonchi, wheezes, rales.  Lymphadenopathy: No cervical adenopathy. Skin: Skin is warm and dry. No rash noted. Not diaphoretic. No erythema. No pallor.  Assessment & Plan:  1. Acute recurrent frontal sinusitis, start Doxycyline 1 tablet every 12 hours x 10 days.   Levocetirizine 5 mg at bedtime( whichever is most cost effective).    Take Benzonatate 100-200 mg 3 times daily as needed.   2. Dysuria, POCT Urinalysis Dipstick, negative. Suspect patient has vaginitis and will treat accordingly. Currently, prescribed Doxycyline for sinusitis which will cover for any underlying UTI symptoms. If symptoms persist, recommend follow-up with PCP.  3. Vaginal itching Take Diflucan 150 mg once for vaginal itching. You may repeat x 1 dose if needed.  If symptoms worsen or do not improve, return for follow-up, follow-up with PCP, or at the emergency department if severity of symptoms warrant a higher level of care.     Carroll Sage. Kenton Kingfisher, MSN, FNP-C [facility]  [place],  [address] [phone]

## 2018-01-27 ENCOUNTER — Telehealth: Payer: Self-pay | Admitting: Emergency Medicine

## 2018-01-27 NOTE — Telephone Encounter (Signed)
Left message follow up call from visit with [facility]. 

## 2018-02-04 ENCOUNTER — Ambulatory Visit: Payer: 59 | Admitting: Internal Medicine

## 2018-02-04 ENCOUNTER — Encounter: Payer: Self-pay | Admitting: Internal Medicine

## 2018-02-04 VITALS — BP 118/78 | HR 72 | Temp 98.0°F | Resp 17 | Ht 64.0 in | Wt 195.5 lb

## 2018-02-04 DIAGNOSIS — R5383 Other fatigue: Secondary | ICD-10-CM

## 2018-02-04 DIAGNOSIS — N760 Acute vaginitis: Secondary | ICD-10-CM

## 2018-02-04 DIAGNOSIS — E785 Hyperlipidemia, unspecified: Secondary | ICD-10-CM

## 2018-02-04 DIAGNOSIS — E611 Iron deficiency: Secondary | ICD-10-CM

## 2018-02-04 DIAGNOSIS — E663 Overweight: Secondary | ICD-10-CM | POA: Insufficient documentation

## 2018-02-04 DIAGNOSIS — L989 Disorder of the skin and subcutaneous tissue, unspecified: Secondary | ICD-10-CM | POA: Diagnosis not present

## 2018-02-04 DIAGNOSIS — E669 Obesity, unspecified: Secondary | ICD-10-CM

## 2018-02-04 DIAGNOSIS — N3000 Acute cystitis without hematuria: Secondary | ICD-10-CM

## 2018-02-04 DIAGNOSIS — Z1283 Encounter for screening for malignant neoplasm of skin: Secondary | ICD-10-CM | POA: Diagnosis not present

## 2018-02-04 DIAGNOSIS — E559 Vitamin D deficiency, unspecified: Secondary | ICD-10-CM | POA: Diagnosis not present

## 2018-02-04 DIAGNOSIS — Z0184 Encounter for antibody response examination: Secondary | ICD-10-CM

## 2018-02-04 DIAGNOSIS — R739 Hyperglycemia, unspecified: Secondary | ICD-10-CM

## 2018-02-04 DIAGNOSIS — Z1159 Encounter for screening for other viral diseases: Secondary | ICD-10-CM

## 2018-02-04 NOTE — Patient Instructions (Addendum)
Vitamin D 1000-2000 D3 daily    Cholesterol Cholesterol is a white, waxy, fat-like substance that is needed by the human body in small amounts. The liver makes all the cholesterol we need. Cholesterol is carried from the liver by the blood through the blood vessels. Deposits of cholesterol (plaques) may build up on blood vessel (artery) walls. Plaques make the arteries narrower and stiffer. Cholesterol plaques increase the risk for heart attack and stroke. You cannot feel your cholesterol level even if it is very high. The only way to know that it is high is to have a blood test. Once you know your cholesterol levels, you should keep a record of the test results. Work with your health care provider to keep your levels in the desired range. What do the results mean?  Total cholesterol is a rough measure of all the cholesterol in your blood.  LDL (low-density lipoprotein) is the "bad" cholesterol. This is the type that causes plaque to build up on the artery walls. You want this level to be low.  HDL (high-density lipoprotein) is the "good" cholesterol because it cleans the arteries and carries the LDL away. You want this level to be high.  Triglycerides are fat that the body can either burn for energy or store. High levels are closely linked to heart disease. What are the desired levels of cholesterol?  Total cholesterol below 200.  LDL below 100 for people who are at risk, below 70 for people at very high risk.  HDL above 40 is good. A level of 60 or higher is considered to be protective against heart disease.  Triglycerides below 150. How can I lower my cholesterol? Diet Follow your diet program as told by your health care provider.  Choose fish or white meat chicken and [place], roasted or baked. Limit fatty cuts of red meat, fried foods, and processed meats, such as sausage and lunch meats.  Eat lots of fresh fruits and vegetables.  Choose whole grains, beans, pasta, potatoes, and  cereals.  Choose olive oil, corn oil, or canola oil, and use only small amounts.  Avoid butter, mayonnaise, shortening, or palm kernel oils.  Avoid foods with trans fats.  Drink skim or nonfat milk and eat low-fat or nonfat yogurt and cheeses. Avoid whole milk, cream, ice cream, egg yolks, and full-fat cheeses.  Healthier desserts include angel food cake, ginger snaps, animal crackers, hard candy, popsicles, and low-fat or nonfat frozen yogurt. Avoid pastries, cakes, pies, and cookies.  Exercise  Follow your exercise program as told by your health care provider. A regular program: ? Helps to decrease LDL and raise HDL. ? Helps with weight control.  Do things that increase your activity level, such as gardening, walking, and taking the stairs.  Ask your health care provider about ways that you can be more active in your daily life.  Medicine  Take over-the-counter and prescription medicines only as told by your health care provider. ? Medicine may be prescribed by your health care provider to help lower cholesterol and decrease the risk for heart disease. This is usually done if diet and exercise have failed to bring down cholesterol levels. ? If you have several risk factors, you may need medicine even if your levels are normal.  This information is not intended to replace advice given to you by your health care provider. Make sure you discuss any questions you have with your health care provider. Document Released: 02/13/2001 Document Revised: 12/17/2015 Document Reviewed: 11/19/2015 Elsevier Interactive  Patient Education  [employer].  Obesity, Adult Obesity is the condition of having too much total body fat. Being overweight or obese means that your weight is greater than what is considered healthy for your body size. Obesity is determined by a measurement called BMI. BMI is an estimate of body fat and is calculated from height and weight. For adults, a BMI of 30 or higher  is considered obese. Obesity can eventually lead to other health concerns and major illnesses, including:  Stroke.  Coronary artery disease (CAD).  Type 2 diabetes.  Some types of cancer, including cancers of the colon, breast, uterus, and gallbladder.  Osteoarthritis.  High blood pressure (hypertension).  High cholesterol.  Sleep apnea.  Gallbladder stones.  Infertility problems.  What are the causes? The main cause of obesity is taking in (consuming) more calories than your body uses for energy. Other factors that contribute to this condition may include:  Being born with genes that make you more likely to become obese.  Having a medical condition that causes obesity. These conditions include: ? Hypothyroidism. ? Polycystic ovarian syndrome (PCOS). ? Binge-eating disorder. ? Cushing syndrome.  Taking certain medicines, such as steroids, antidepressants, and seizure medicines.  Not being physically active (sedentary lifestyle).  Living where there are limited places to exercise safely or buy healthy foods.  Not getting enough sleep.  What increases the risk? The following factors may increase your risk of this condition:  Having a family history of obesity.  Being a woman of African-American descent.  Being a man of Hispanic descent.  What are the signs or symptoms? Having excessive body fat is the main symptom of this condition. How is this diagnosed? This condition may be diagnosed based on:  Your symptoms.  Your medical history.  A physical exam. Your health care provider may measure: ? Your BMI. If you are an adult with a BMI between 25 and less than 30, you are considered overweight. If you are an adult with a BMI of 30 or higher, you are considered obese. ? The distances around your hips and your waist (circumferences). These may be compared to each other to help diagnose your condition. ? Your skinfold thickness. Your health care provider may  gently pinch a fold of your skin and measure it.  How is this treated? Treatment for this condition often includes changing your lifestyle. Treatment may include some or all of the following:  Dietary changes. Work with your health care provider and a dietitian to set a weight-loss goal that is healthy and reasonable for you. Dietary changes may include eating: ? Smaller portions. A portion size is the amount of a particular food that is healthy for you to eat at one time. This varies from person to person. ? Low-calorie or low-fat options. ? More whole grains, fruits, and vegetables.  Regular physical activity. This may include aerobic activity (cardio) and strength training.  Medicine to help you lose weight. Your health care provider may prescribe medicine if you are unable to lose 1 pound a week after 6 weeks of eating more healthily and doing more physical activity.  Surgery. Surgical options may include gastric banding and gastric bypass. Surgery may be done if: ? Other treatments have not helped to improve your condition. ? You have a BMI of 40 or higher. ? You have life-threatening health problems related to obesity.  Follow these instructions at home:  Eating and drinking   Follow recommendations from your health care provider  about what you eat and drink. Your health care provider may advise you to: ? Limit fast foods, sweets, and processed snack foods. ? Choose low-fat options, such as low-fat milk instead of whole milk. ? Eat 5 or more servings of fruits or vegetables every day. ? Eat at home more often. This gives you more control over what you eat. ? Choose healthy foods when you eat out. ? Learn what a healthy portion size is. ? Keep low-fat snacks on hand. ? Avoid sugary drinks, such as soda, fruit juice, iced tea sweetened with sugar, and flavored milk. ? Eat a healthy breakfast.  Drink enough water to keep your urine clear or pale yellow.  Do not go without  eating for long periods of time (do not fast) or follow a fad diet. Fasting and fad diets can be unhealthy and even dangerous. Physical Activity  Exercise regularly, as told by your health care provider. Ask your health care provider what types of exercise are safe for you and how often you should exercise.  Warm up and stretch before being active.  Cool down and stretch after being active.  Rest between periods of activity. Lifestyle  Limit the time that you spend in front of your TV, computer, or video game system.  Find ways to reward yourself that do not involve food.  Limit alcohol intake to no more than 1 drink a day for nonpregnant women and 2 drinks a day for men. One drink equals 12 oz of beer, 5 oz of wine, or 1 oz of hard liquor. General instructions  Keep a weight loss journal to keep track of the food you eat and how much you exercise you get.  Take over-the-counter and prescription medicines only as told by your health care provider.  Take vitamins and supplements only as told by your health care provider.  Consider joining a support group. Your health care provider may be able to recommend a support group.  Keep all follow-up visits as told by your health care provider. This is important. Contact a health care provider if:  You are unable to meet your weight loss goal after 6 weeks of dietary and lifestyle changes. This information is not intended to replace advice given to you by your health care provider. Make sure you discuss any questions you have with your health care provider. Document Released: 06/28/2004 Document Revised: 10/24/2015 Document Reviewed: 03/09/2015 Elsevier Interactive Patient Education  2018 [employer].

## 2018-02-04 NOTE — Progress Notes (Signed)
Chief Complaint  Patient presents with  . Establish Care   New patient  1. Sinus infection just tx'ed doxycycline feeling better  2. C/o vaginal irritation and itching completed diflucan still with sx's  3. C/o fatigue due for comprehensive labs will resch  4. Obesity BMI >33 and HLD. She reports she does not eat vegetables. Reports wt gain since hysterectomy and since last daughter born she used to weight 112 lbs before 2016  5. C/o 2 knots on crown of scalp and skin tags under arms and mole left lower back and right inner thigh will refer to dermatology for tbse 6. FH DM 2 strong in mom, m aunts, sister type 2 she is c/w risk of getting diabetes    Review of Systems  Constitutional: Positive for malaise/fatigue. Negative for weight loss.  HENT: Negative for hearing loss.   Eyes: Negative for blurred vision.  Respiratory: Negative for shortness of breath.   Cardiovascular: Negative for chest pain.  Gastrointestinal: Negative for abdominal pain.  Musculoskeletal: Negative for joint pain.  Skin:       Skin lesions scalp x 2, axilla, left lower back and right inner thigh    Neurological: Negative for headaches.  Psychiatric/Behavioral: Negative for depression.   Past Medical History:  Diagnosis Date  . Anxiety   . GERD (gastroesophageal reflux disease)   . Herpes genitalis in women   . History of kidney stones    h/o  . Low-lying placenta    Past Surgical History:  Procedure Laterality Date  . ABDOMINAL HYSTERECTOMY     total Dr. Enzo Bi 05/13/17 no h/o abnormal pap for endometriosis and right ovarian cyst  . DIAGNOSTIC LAPAROSCOPY    . ESOPHAGOGASTRODUODENOSCOPY (EGD) WITH PROPOFOL N/A 06/19/2016   Procedure: ESOPHAGOGASTRODUODENOSCOPY (EGD) WITH PROPOFOL;  Surgeon: Lucilla Lame, MD;  Location: [facility];  Service: Endoscopy;  Laterality: N/A;  . LAPAROSCOPIC VAGINAL HYSTERECTOMY WITH SALPINGO OOPHORECTOMY Bilateral 05/13/2017   Procedure: LAPAROSCOPIC ASSISTED  VAGINAL HYSTERECTOMY WITH BILATERAL SALPINGO OOPHORECTOMY;  Surgeon: Brayton Mars, MD;  Location: [facility];  Service: Gynecology;  Laterality: Bilateral;  . LAPROSCOPY    . REMOVAL OF DRUG DELIVERY IMPLANT Left 05/13/2017   Procedure: REMOVAL OF DRUG DELIVERY IMPLANT, LEFT ARM;  Surgeon: Brayton Mars, MD;  Location: [facility];  Service: Gynecology;  Laterality: Left;  . TONSILLECTOMY    . TONSILLECTOMY     Family History  Problem Relation Age of Onset  . Diabetes Maternal Grandmother   . Diabetes Maternal Grandfather   . Diabetes Mother        type 2  . Diabetes Sister        type 2   . Asthma Daughter   . Diabetes Maternal Aunt        type 2  . Asthma Daughter   . Diabetes Maternal Aunt        type 2   Social History   Socioeconomic History  . Marital status: Married    Spouse name: Not on file  . Number of children: Not on file  . Years of education: Not on file  . Highest education level: Not on file  Occupational History  . Not on file  Social Needs  . Financial resource strain: Not on file  . Food insecurity:    Worry: Not on file    Inability: Not on file  . Transportation needs:    Medical: Not on file    Non-medical: Not on file  Tobacco Use  .  Smoking status: Never Smoker  . Smokeless tobacco: Never Used  Substance and Sexual Activity  . Alcohol use: No  . Drug use: No  . Sexual activity: Yes    Birth control/protection: None, Surgical  Lifestyle  . Physical activity:    Days per week: Not on file    Minutes per session: Not on file  . Stress: Not on file  Relationships  . Social connections:    Talks on phone: Not on file    Gets together: Not on file    Attends religious service: Not on file    Active member of club or organization: Not on file    Attends meetings of clubs or organizations: Not on file    Relationship status: Not on file  . Intimate partner violence:    Fear of current or ex partner: Not on file    Emotionally  abused: Not on file    Physically abused: Not on file    Forced sexual activity: Not on file  Other Topics Concern  . 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 calcium-vitamin D (OSCAL WITH D) 500-200 MG-UNIT tablet Take 1 tablet by mouth.  . escitalopram (LEXAPRO) 10 MG tablet TAKE 1 TABLET BY MOUTH ONCE DAILY  . estradiol (VIVELLE-DOT) 0.1 MG/24HR patch Place 1 patch (0.1 mg total) onto the skin 2 (two) times a week.  . lansoprazole (PREVACID) 30 MG capsule    No Known Allergies Recent Results (from the past 2160 hour(s))  POCT Urinalysis Dipstick     Status: Abnormal   Collection Time: 01/23/18 12:00 PM  Result Value Ref Range   Color, UA clear    Clarity, UA yellow    Glucose, UA Negative Negative   Bilirubin, UA neg    Ketones, UA neg    Spec Grav, UA 1.020 1.010 - 1.025   Blood, UA neg    pH, UA 5.0 5.0 - 8.0   Protein, UA Negative Negative   Urobilinogen, UA negative (A) 0.2 or 1.0 E.U./dL   Nitrite, UA neg    Leukocytes, UA Negative Negative   Appearance     Odor     Objective  Body mass index is 33.56 kg/m. Wt Readings from Last 3 Encounters:  02/04/18 195 lb 8 oz (88.7 kg)  01/23/18 190 lb (86.2 kg)  09/26/17 185 lb (83.9 kg)   Temp Readings from Last 3 Encounters:  02/04/18 98 F (36.7 C) (Oral)  01/23/18 98.1 F (36.7 C)  09/26/17 98.1 F (36.7 C)   BP Readings from Last 3 Encounters:  02/04/18 118/78  09/26/17 110/80  07/31/17 108/67   Pulse Readings from Last 3 Encounters:  02/04/18 72  01/23/18 99  09/26/17 68    Physical Exam  Constitutional: She is oriented to person, place, and time. Vital signs are normal. She appears well-developed and well-nourished. She is cooperative.  HENT:  Head: Normocephalic and atraumatic.  Mouth/Throat: Oropharynx is clear and moist and mucous membranes are normal.  Eyes: Pupils are  equal, round, and reactive to light. Conjunctivae are normal.  Cardiovascular: Normal rate, regular rhythm and normal heart sounds.  Pulmonary/Chest: Effort normal and breath sounds normal.  Neurological: She is alert and oriented to person, place, and time. Gait normal.  Skin: Skin  is warm, dry and intact.  Psychiatric: She has a normal mood and affect. Her speech is normal and behavior is normal. Judgment and thought content normal. Cognition and memory are normal.  Nursing note and vitals reviewed.   Assessment   1. Vaginal irritation/vaginitis  2. Fatigue with sleeping 6-8 hours  3. hld  4. Obesity BMI 33.56  5. Skin lesions in scalp, left lower back, right inner thigh skin tags b/l axilla  6. HM Plan   1. Check BV urine  2. Check comprehensive labs upcoming  3. Given cholesterol info recheck rec lifestyle changes  4. See #3  5. Refer dermatology  6.  Will get flu shot at work  Tdap had 02/09/15  Check MMR and hep B status   Fasting labs upcoming  S/p total hysterectomy 05/23/17 no h/o abnormal pap was for endometriosis and right ovarian cyst rec f/u with Dr. Keturah Barre as sch 07/2018 and ask if pap still rec.   [place] [facility]  [facility]  [facility] [address] Enzo Bi   Provider: Dr. Olivia Mackie McLean-Scocuzza-Internal Medicine

## 2018-02-05 ENCOUNTER — Other Ambulatory Visit (HOSPITAL_COMMUNITY)
Admission: RE | Admit: 2018-02-05 | Discharge: 2018-02-05 | Disposition: A | Discharge status: Home / Self Care | Payer: 59 | Source: Ambulatory Visit | Attending: Internal Medicine | Admitting: Internal Medicine

## 2018-02-05 ENCOUNTER — Other Ambulatory Visit (INDEPENDENT_AMBULATORY_CARE_PROVIDER_SITE_OTHER): Payer: 59

## 2018-02-05 DIAGNOSIS — E559 Vitamin D deficiency, unspecified: Secondary | ICD-10-CM

## 2018-02-05 DIAGNOSIS — N760 Acute vaginitis: Secondary | ICD-10-CM | POA: Diagnosis not present

## 2018-02-05 DIAGNOSIS — N3000 Acute cystitis without hematuria: Secondary | ICD-10-CM | POA: Diagnosis not present

## 2018-02-05 DIAGNOSIS — R5383 Other fatigue: Secondary | ICD-10-CM

## 2018-02-05 DIAGNOSIS — E611 Iron deficiency: Secondary | ICD-10-CM | POA: Diagnosis not present

## 2018-02-05 DIAGNOSIS — Z0184 Encounter for antibody response examination: Secondary | ICD-10-CM

## 2018-02-05 DIAGNOSIS — Z1159 Encounter for screening for other viral diseases: Secondary | ICD-10-CM

## 2018-02-05 DIAGNOSIS — R739 Hyperglycemia, unspecified: Secondary | ICD-10-CM | POA: Diagnosis not present

## 2018-02-05 DIAGNOSIS — E785 Hyperlipidemia, unspecified: Secondary | ICD-10-CM

## 2018-02-05 LAB — COMPREHENSIVE METABOLIC PANEL
ALT: 15 U/L (ref 0–35)
AST: 14 U/L (ref 0–37)
Albumin: 3.8 g/dL (ref 3.5–5.2)
Alkaline Phosphatase: 92 U/L (ref 39–117)
BILIRUBIN TOTAL: 0.5 mg/dL (ref 0.2–1.2)
BUN: 10 mg/dL (ref 6–23)
CO2: 29 meq/L (ref 19–32)
Calcium: 9 mg/dL (ref 8.4–10.5)
Chloride: 102 mEq/L (ref 96–112)
Creatinine, Ser: 0.69 mg/dL (ref 0.40–1.20)
GFR: 105.22 mL/min (ref >60.00)
Glucose, Bld: 92 mg/dL (ref 70–99)
Potassium: 4.3 mEq/L (ref 3.5–5.1)
SODIUM: 138 meq/L (ref 135–145)
TOTAL PROTEIN: 7.3 g/dL (ref 6.0–8.3)

## 2018-02-05 LAB — URINALYSIS, ROUTINE W REFLEX MICROSCOPIC
Bilirubin Urine: NEGATIVE
Hgb urine dipstick: NEGATIVE
KETONES UR: NEGATIVE
Leukocytes, UA: NEGATIVE
Nitrite: NEGATIVE
PH: 7 (ref 5.0–8.0)
RBC / HPF: NONE SEEN (ref 0–?)
SPECIFIC GRAVITY, URINE: 1.015 (ref 1.000–1.030)
Total Protein, Urine: NEGATIVE
UROBILINOGEN UA: 0.2 (ref 0.0–1.0)
Urine Glucose: NEGATIVE
WBC, UA: NONE SEEN (ref 0–?)

## 2018-02-05 LAB — CBC WITH DIFFERENTIAL/PLATELET
BASOS ABS: 0.1 10*3/uL (ref 0.0–0.1)
Basophils Relative: 1 % (ref 0.0–3.0)
EOS ABS: 0.2 10*3/uL (ref 0.0–0.7)
Eosinophils Relative: 2 % (ref 0.0–5.0)
HCT: 39.9 % (ref 36.0–46.0)
Hemoglobin: 13.6 g/dL (ref 12.0–15.0)
LYMPHS ABS: 3.4 10*3/uL (ref 0.7–4.0)
Lymphocytes Relative: 38.7 % (ref 12.0–46.0)
MCHC: 34.1 g/dL (ref 30.0–36.0)
MCV: 85.9 fl (ref 78.0–100.0)
MONOS PCT: 4.1 % (ref 3.0–12.0)
Monocytes Absolute: 0.4 10*3/uL (ref 0.1–1.0)
NEUTROS ABS: 4.8 10*3/uL (ref 1.4–7.7)
NEUTROS PCT: 54.2 % (ref 43.0–77.0)
PLATELETS: 299 10*3/uL (ref 150.0–400.0)
RBC: 4.65 Mil/uL (ref 3.87–5.11)
RDW: 13.2 % (ref 11.5–15.5)
WBC: 8.9 10*3/uL (ref 4.0–10.5)

## 2018-02-05 LAB — VITAMIN D 25 HYDROXY (VIT D DEFICIENCY, FRACTURES): VITD: 28.62 ng/mL — ABNORMAL LOW (ref 30.00–100.00)

## 2018-02-05 LAB — T4, FREE: Free T4: 0.63 ng/dL (ref 0.60–1.60)

## 2018-02-05 LAB — LIPID PANEL
CHOLESTEROL: 204 mg/dL — AB (ref 0–200)
HDL: 44.1 mg/dL (ref >39.00)
LDL Cholesterol: 126 mg/dL — ABNORMAL HIGH (ref 0–99)
NonHDL: 159.65
Total CHOL/HDL Ratio: 5
Triglycerides: 166 mg/dL — ABNORMAL HIGH (ref 0.0–149.0)
VLDL: 33.2 mg/dL (ref 0.0–40.0)

## 2018-02-05 LAB — HEMOGLOBIN A1C: HEMOGLOBIN A1C: 5.5 % (ref 4.6–6.5)

## 2018-02-05 LAB — TSH: TSH: 0.32 u[IU]/mL — ABNORMAL LOW (ref 0.35–4.50)

## 2018-02-05 NOTE — Addendum Note (Signed)
Addended by: Arby Barrette on: 02/05/2018 09:32 AM   Modules accepted: Orders

## 2018-02-06 ENCOUNTER — Other Ambulatory Visit (INDEPENDENT_AMBULATORY_CARE_PROVIDER_SITE_OTHER): Payer: 59

## 2018-02-06 ENCOUNTER — Other Ambulatory Visit: Payer: Self-pay | Admitting: Internal Medicine

## 2018-02-06 DIAGNOSIS — R7989 Other specified abnormal findings of blood chemistry: Secondary | ICD-10-CM

## 2018-02-06 LAB — IRON,TIBC AND FERRITIN PANEL
%SAT: 31 % (calc) (ref 16–45)
FERRITIN: 51 ng/mL (ref 16–154)
Iron: 92 ug/dL (ref 40–190)
TIBC: 293 mcg/dL (calc) (ref 250–450)

## 2018-02-06 LAB — HEPATITIS B SURFACE ANTIBODY, QUANTITATIVE: Hepatitis B-Post: 143 m[IU]/mL (ref >=10)

## 2018-02-06 LAB — URINE CULTURE
MICRO NUMBER:: 91055874
SPECIMEN QUALITY:: ADEQUATE

## 2018-02-06 LAB — MEASLES/MUMPS/RUBELLA IMMUNITY
MUMPS IGG: 67.7 [AU]/ml
Rubella: 12.8 index
Rubeola IgG: 134 AU/mL

## 2018-02-06 LAB — T3, FREE: T3 FREE: 3.5 pg/mL (ref 2.3–4.2)

## 2018-02-09 LAB — URINE CYTOLOGY ANCILLARY ONLY: Bacterial vaginitis: NEGATIVE

## 2018-02-14 ENCOUNTER — Other Ambulatory Visit: Payer: Self-pay

## 2018-02-17 ENCOUNTER — Other Ambulatory Visit: Payer: Self-pay | Admitting: Obstetrics and Gynecology

## 2018-04-08 ENCOUNTER — Ambulatory Visit: Payer: 59 | Admitting: Internal Medicine

## 2018-04-08 ENCOUNTER — Encounter: Payer: Self-pay | Admitting: Internal Medicine

## 2018-04-08 VITALS — BP 104/78 | HR 73 | Temp 97.9°F | Ht 64.0 in | Wt 195.2 lb

## 2018-04-08 DIAGNOSIS — E669 Obesity, unspecified: Secondary | ICD-10-CM | POA: Diagnosis not present

## 2018-04-08 DIAGNOSIS — Z90710 Acquired absence of both cervix and uterus: Secondary | ICD-10-CM

## 2018-04-08 DIAGNOSIS — B379 Candidiasis, unspecified: Secondary | ICD-10-CM | POA: Diagnosis not present

## 2018-04-08 DIAGNOSIS — R946 Abnormal results of thyroid function studies: Secondary | ICD-10-CM | POA: Diagnosis not present

## 2018-04-08 LAB — TSH: TSH: 0.65 u[IU]/mL (ref 0.35–4.50)

## 2018-04-08 LAB — T3, FREE: T3, Free: 3.2 pg/mL (ref 2.3–4.2)

## 2018-04-08 LAB — T4, FREE: FREE T4: 0.64 ng/dL (ref 0.60–1.60)

## 2018-04-08 MED ORDER — FLUCONAZOLE 150 MG PO TABS: 150.0000 mg | ORAL_TABLET | Freq: Once | ORAL | 0 refills | Status: AC

## 2018-04-08 MED ORDER — PHENTERMINE HCL 37.5 MG PO TABS: 37.5000 mg | ORAL_TABLET | Freq: Every day | ORAL | 0 refills | Status: DC

## 2018-04-08 NOTE — Progress Notes (Signed)
Chief Complaint  Patient presents with  . Follow-up    following up thyroid.   F/u  1. Abnormal thyroid test sister has hyperthyroidism she thinks  2. S/p hysterectomy 05/2017 due to f/u with OB/GYN Dr. D leaving and will make appt with Dr. Marcelline Mates. C/o yeast infection ad would like to try Diflucan  3. Obesity c/o wt gain after hysterectomy though eating well and exercising    Review of Systems  Constitutional: Negative for weight loss.  HENT: Negative for hearing loss.   Eyes: Negative for blurred vision.  Respiratory: Negative for shortness of breath.   Cardiovascular: Negative for chest pain.  Gastrointestinal: Negative for abdominal pain.       Increased appetite   Musculoskeletal: Negative for falls.  Skin: Negative for rash.  Neurological: Negative for headaches.  Psychiatric/Behavioral: Negative for depression.   Past Medical History:  Diagnosis Date  . Anxiety   . GERD (gastroesophageal reflux disease)   . Herpes genitalis in women   . History of kidney stones    h/o  . Low-lying placenta    Past Surgical History:  Procedure Laterality Date  . ABDOMINAL HYSTERECTOMY     total Dr. Enzo Bi 05/13/17 no h/o abnormal pap for endometriosis and right ovarian cyst  . DIAGNOSTIC LAPAROSCOPY    . ESOPHAGOGASTRODUODENOSCOPY (EGD) WITH PROPOFOL N/A 06/19/2016   Procedure: ESOPHAGOGASTRODUODENOSCOPY (EGD) WITH PROPOFOL;  Surgeon: Lucilla Lame, MD;  Location: [facility];  Service: Endoscopy;  Laterality: N/A;  . LAPAROSCOPIC VAGINAL HYSTERECTOMY WITH SALPINGO OOPHORECTOMY Bilateral 05/13/2017   Procedure: LAPAROSCOPIC ASSISTED VAGINAL HYSTERECTOMY WITH BILATERAL SALPINGO OOPHORECTOMY;  Surgeon: Brayton Mars, MD;  Location: [facility];  Service: Gynecology;  Laterality: Bilateral;  . LAPROSCOPY    . REMOVAL OF DRUG DELIVERY IMPLANT Left 05/13/2017   Procedure: REMOVAL OF DRUG DELIVERY IMPLANT, LEFT ARM;  Surgeon: Brayton Mars, MD;  Location: [facility];   Service: Gynecology;  Laterality: Left;  . TONSILLECTOMY    . TONSILLECTOMY     Family History  Problem Relation Age of Onset  . Diabetes Maternal Grandmother   . Diabetes Maternal Grandfather   . Diabetes Mother        type 2  . Diabetes Sister        type 2   . Asthma Daughter   . Diabetes Maternal Aunt        type 2  . Asthma Daughter   . Diabetes Maternal Aunt        type 2   Social History   Socioeconomic History  . Marital status: Married    Spouse name: Not on file  . Number of children: Not on file  . Years of education: Not on file  . Highest education level: Not on file  Occupational History  . Not on file  Social Needs  . Financial resource strain: Not on file  . Food insecurity:    Worry: Not on file    Inability: Not on file  . Transportation needs:    Medical: Not on file    Non-medical: Not on file  Tobacco Use  . Smoking status: Never Smoker  . Smokeless tobacco: Never Used  Substance and Sexual Activity  . Alcohol use: No  . Drug use: No  . Sexual activity: Yes    Birth control/protection: None, Surgical  Lifestyle  . Physical activity:    Days per week: Not on file    Minutes per session: Not on file  . Stress: Not on file  Relationships  . Social connections:    Talks on phone: Not on file    Gets together: Not on file    Attends religious service: Not on file    Active member of club or organization: Not on file    Attends meetings of clubs or organizations: Not on file    Relationship status: Not on file  . Intimate partner violence:    Fear of current or ex partner: Not on file    Emotionally abused: Not on file    Physically abused: Not on file    Forced sexual activity: Not on file  Other Topics Concern  . 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 calcium-vitamin D (OSCAL WITH D) 500-200 MG-UNIT tablet  Take 1 tablet by mouth.  . escitalopram (LEXAPRO) 10 MG tablet TAKE 1 TABLET BY MOUTH ONCE DAILY  . estradiol (VIVELLE-DOT) 0.1 MG/24HR patch Place 1 patch (0.1 mg total) onto the skin 2 (two) times a week.  . lansoprazole (PREVACID) 30 MG capsule   . lansoprazole (PREVACID) 30 MG capsule Take 1 capsule (30 mg total) by mouth at bedtime.   No Known Allergies Recent Results (from the past 2160 hour(s))  POCT Urinalysis Dipstick     Status: Abnormal   Collection Time: 01/23/18 12:00 PM  Result Value Ref Range   Color, UA clear    Clarity, UA yellow    Glucose, UA Negative Negative   Bilirubin, UA neg    Ketones, UA neg    Spec Grav, UA 1.020 1.010 - 1.025   Blood, UA neg    pH, UA 5.0 5.0 - 8.0   Protein, UA Negative Negative   Urobilinogen, UA negative (A) 0.2 or 1.0 E.U./dL   Nitrite, UA neg    Leukocytes, UA Negative Negative   Appearance     Odor    Urine cytology ancillary only     Status: None   Collection Time: 02/05/18 12:00 AM  Result Value Ref Range   Bacterial vaginitis Negative for Bacterial Vaginitis Microorganisms     Comment: Normal Reference Range - Negative  Hepatitis B surface antibody,quantitative     Status: None   Collection Time: 02/05/18  9:17 AM  Result Value Ref Range   Hepatitis B-Post 143 > OR = 10 mIU/mL    Comment: . Patient has immunity to hepatitis B virus. . For additional information, please refer to http://education.[facility].com/faq/FAQ105 (This link is being provided for informational/ educational purposes only).   Measles/Mumps/Rubella Immunity     Status: None   Collection Time: 02/05/18  9:17 AM  Result Value Ref Range   Rubeola IgG 134.00 AU/mL    Comment: AU/mL            Interpretation -----            -------------- <25.00           Negative 25.00-29.99      Equivocal >29.99           Positive . A positive result indicates that the patient has antibody to measles virus. It does not differentiate  between an active  or past infection. The clinical  diagnosis must be interpreted in conjunction with  clinical signs and symptoms of the patient.    Mumps  IgG 67.70 AU/mL    Comment:  AU/mL           Interpretation -------         ---------------- <9.00             Negative 9.00-10.99        Equivocal >10.99            Positive A positive result indicates that the patient has  antibody to mumps virus. It does not differentiate between an  active or past infection. The clinical diagnosis must be interpreted in conjunction with clinical signs and symptoms of the patient. .    Rubella 12.80 index    Comment:     Index            Interpretation     -----            --------------       <0.90            Not consistent with Immunity     0.90-0.99        Equivocal     > or = 1.00      Consistent with Immunity  . The presence of rubella IgG antibody suggests  immunization or past or current infection with rubella virus.   Hemoglobin A1c     Status: None   Collection Time: 02/05/18  9:17 AM  Result Value Ref Range   Hgb A1c MFr Bld 5.5 4.6 - 6.5 %    Comment: Glycemic Control Guidelines for People with Diabetes:Non Diabetic:  <6%Goal of Therapy: <7%Additional Action Suggested:  >8%   Iron, TIBC and Ferritin Panel     Status: None   Collection Time: 02/05/18  9:17 AM  Result Value Ref Range   Iron 92 40 - 190 mcg/dL   TIBC 293 250 - 450 mcg/dL (calc)   %SAT 31 16 - 45 % (calc)   Ferritin 51 16 - 154 ng/mL  Vitamin D (25 hydroxy)     Status: Abnormal   Collection Time: 02/05/18  9:17 AM  Result Value Ref Range   VITD 28.62 (L) 30.00 - 100.00 ng/mL  T4, free     Status: None   Collection Time: 02/05/18  9:17 AM  Result Value Ref Range   Free T4 0.63 0.60 - 1.60 ng/dL    Comment: Specimens from patients who are undergoing biotin therapy and /or ingesting biotin supplements may contain high levels of biotin.  The higher biotin concentration in these specimens interferes with this Free T4 assay.   Specimens that contain high levels  of biotin may cause false high results for this Free T4 assay.  Please interpret results in light of the total clinical presentation of the patient.    TSH     Status: Abnormal   Collection Time: 02/05/18  9:17 AM  Result Value Ref Range   TSH 0.32 (L) 0.35 - 4.50 uIU/mL  Lipid panel     Status: Abnormal   Collection Time: 02/05/18  9:17 AM  Result Value Ref Range   Cholesterol 204 (H) 0 - 200 mg/dL    Comment: ATP III Classification       Desirable:  < 200 mg/dL               Borderline High:  200 - 239 mg/dL          High:  > = 240 mg/dL   Triglycerides 166.0 (H) 0.0 - 149.0 mg/dL  Comment: Normal:  <150 mg/dLBorderline High:  150 - 199 mg/dL   HDL 44.10 >39.00 mg/dL   VLDL 33.2 0.0 - 40.0 mg/dL   LDL Cholesterol 126 (H) 0 - 99 mg/dL   Total CHOL/HDL Ratio 5     Comment:                Men          Women1/2 Average Risk     3.4          3.3Average Risk          5.0          4.42X Average Risk          9.6          7.13X Average Risk          15.0          11.0                       NonHDL 159.65     Comment: NOTE:  Non-HDL goal should be 30 mg/dL higher than patient's LDL goal (i.e. LDL goal of < 70 mg/dL, would have non-HDL goal of < 100 mg/dL)  CBC with Differential/Platelet     Status: None   Collection Time: 02/05/18  9:17 AM  Result Value Ref Range   WBC 8.9 4.0 - 10.5 K/uL   RBC 4.65 3.87 - 5.11 Mil/uL   Hemoglobin 13.6 12.0 - 15.0 g/dL   HCT 39.9 36.0 - 46.0 %   MCV 85.9 78.0 - 100.0 fl   MCHC 34.1 30.0 - 36.0 g/dL   RDW 13.2 11.5 - 15.5 %   Platelets 299.0 150.0 - 400.0 K/uL   Neutrophils Relative % 54.2 43.0 - 77.0 %   Lymphocytes Relative 38.7 12.0 - 46.0 %   Monocytes Relative 4.1 3.0 - 12.0 %   Eosinophils Relative 2.0 0.0 - 5.0 %   Basophils Relative 1.0 0.0 - 3.0 %   Neutro Abs 4.8 1.4 - 7.7 K/uL   Lymphs Abs 3.4 0.7 - 4.0 K/uL   Monocytes Absolute 0.4 0.1 - 1.0 K/uL   Eosinophils Absolute 0.2 0.0 - 0.7 K/uL   Basophils  Absolute 0.1 0.0 - 0.1 K/uL  Comprehensive metabolic panel     Status: None   Collection Time: 02/05/18  9:17 AM  Result Value Ref Range   Sodium 138 135 - 145 mEq/L   Potassium 4.3 3.5 - 5.1 mEq/L   Chloride 102 96 - 112 mEq/L   CO2 29 19 - 32 mEq/L   Glucose, Bld 92 70 - 99 mg/dL   BUN 10 6 - 23 mg/dL   Creatinine, Ser 0.69 0.40 - 1.20 mg/dL   Total Bilirubin 0.5 0.2 - 1.2 mg/dL   Alkaline Phosphatase 92 39 - 117 U/L   AST 14 0 - 37 U/L   ALT 15 0 - 35 U/L   Total Protein 7.3 6.0 - 8.3 g/dL   Albumin 3.8 3.5 - 5.2 g/dL   Calcium 9.0 8.4 - 10.5 mg/dL   GFR 105.22 >60.00 mL/min  Urinalysis, Routine w reflex microscopic     Status: None   Collection Time: 02/05/18  9:32 AM  Result Value Ref Range   Color, Urine YELLOW Yellow;Lt. Yellow   APPearance CLEAR Clear   Specific Gravity, Urine 1.015 1.000 - 1.030   pH 7.0 5.0 - 8.0   Total Protein, Urine NEGATIVE Negative   Urine Glucose  NEGATIVE Negative   Ketones, ur NEGATIVE Negative   Bilirubin Urine NEGATIVE Negative   Hgb urine dipstick NEGATIVE Negative   Urobilinogen, UA 0.2 0.0 - 1.0   Leukocytes, UA NEGATIVE Negative   Nitrite NEGATIVE Negative   WBC, UA none seen 0-2/hpf   RBC / HPF none seen 0-2/hpf   Squamous Epithelial / LPF Rare(0-4/hpf) Rare(0-4/hpf)  Urine Culture     Status: None   Collection Time: 02/05/18  9:32 AM  Result Value Ref Range   MICRO NUMBER: [id number]    SPECIMEN QUALITY: ADEQUATE    Sample Source URINE    STATUS: FINAL    Result:      Multiple organisms present, each less than 10,000 CFU/mL. These organisms, commonly found on external and internal genitalia, are considered to be colonizers. No further testing performed.  T3, free     Status: None   Collection Time: 02/06/18  9:49 AM  Result Value Ref Range   T3, Free 3.5 2.3 - 4.2 pg/mL   Objective  Body mass index is 33.51 kg/m. Wt Readings from Last 3 Encounters:  04/08/18 195 lb 3.2 oz (88.5 kg)  02/04/18 195 lb 8 oz (88.7 kg)   01/23/18 190 lb (86.2 kg)   Temp Readings from Last 3 Encounters:  04/08/18 97.9 F (36.6 C) (Oral)  02/04/18 98 F (36.7 C) (Oral)  01/23/18 98.1 F (36.7 C)   BP Readings from Last 3 Encounters:  04/08/18 104/78  02/04/18 118/78  09/26/17 110/80   Pulse Readings from Last 3 Encounters:  04/08/18 73  02/04/18 72  01/23/18 99    Physical Exam  Constitutional: She is oriented to person, place, and time. Vital signs are normal. She appears well-developed and well-nourished. She is cooperative.  HENT:  Head: Normocephalic and atraumatic.  Mouth/Throat: Oropharynx is clear and moist and mucous membranes are normal.  Eyes: Pupils are equal, round, and reactive to light. Conjunctivae are normal.  Cardiovascular: Normal rate, regular rhythm and normal heart sounds.  Pulmonary/Chest: Effort normal and breath sounds normal.  Neurological: She is alert and oriented to person, place, and time. Gait normal.  Skin: Skin is warm, dry and intact.  Psychiatric: She has a normal mood and affect. Her speech is normal and behavior is normal. Judgment and thought content normal. Cognition and memory are normal.  Nursing note and vitals reviewed.   Assessment   1. Abnormal thyroid function subclinical hyperthyroidism. No goiter on exam 2. Obesity BMI 33.51  3. C/w yeast vaginitis  4. HM  Plan   1. Repeat thyroid labs  2. adipex x 2 months  3. Diflucan x 3 pills  4.  Flu shot had at work Tdap had 02/09/15  Immune MMR and hep B status  S/p total hysterectomy 05/23/17 no h/o abnormal pap was for endometriosis and right ovarian cyst rec f/u with Dr. Keturah Barre as sch 07/2018 and ask if pap still rec.  -pt to call to sch appt   [facility]  [facility]  [facility]. Enzo Bi  Derm appt next week    Provider: Dr. Olivia Mackie McLean-Scocuzza-Internal Medicine

## 2018-04-08 NOTE — Patient Instructions (Addendum)
Increase fiber chia or flaxseeds but you dont like     (Adipex) Phentermine tablets or capsules What is this medicine? PHENTERMINE (FEN ter meen) decreases your appetite. It is used with a reduced calorie diet and exercise to help you lose weight. This medicine may be used for other purposes; ask your health care provider or pharmacist if you have questions. COMMON BRAND NAME(S): Adipex-P, Atti-Plex P, Atti-Plex P Spansule, Fastin, Lomaira, Pro-Fast, Tara-8 What should I tell my health care provider before I take this medicine? They need to know if you have any of these conditions: -agitation -glaucoma -heart disease -high blood pressure -history of substance abuse -lung disease called Primary Pulmonary Hypertension (PPH) -taken an MAOI like Carbex, Eldepryl, Marplan, Nardil, or Parnate in last 14 days -thyroid disease -an unusual or allergic reaction to phentermine, other medicines, foods, dyes, or preservatives -pregnant or trying to get pregnant -breast-feeding How should I use this medicine? Take this medicine by mouth with a glass of water. Follow the directions on the prescription label. The instructions for use may differ based on the product and dose you are taking. Avoid taking this medicine in the evening. It may interfere with sleep. Take your doses at regular intervals. Do not take your medicine more often than directed. Talk to your pediatrician regarding the use of this medicine in children. While this drug may be prescribed for children 17 years or older for selected conditions, precautions do apply. Overdosage: If you think you have taken too much of this medicine contact a poison control center or emergency room at once. NOTE: This medicine is only for you. Do not share this medicine with others. What if I miss a dose? If you miss a dose, take it as soon as you can. If it is almost time for your next dose, take only that dose. Do not take double or extra doses. What may  interact with this medicine? Do not take this medicine with any of the following medications: -duloxetine -MAOIs like Carbex, Eldepryl, Marplan, Nardil, and Parnate -medicines for colds or breathing difficulties like pseudoephedrine or phenylephrine -procarbazine -sibutramine -SSRIs like citalopram, escitalopram, fluoxetine, fluvoxamine, paroxetine, and sertraline -stimulants like dexmethylphenidate, methylphenidate or modafinil -venlafaxine This medicine may also interact with the following medications: -medicines for diabetes This list may not describe all possible interactions. Give your health care provider a list of all the medicines, herbs, non-prescription drugs, or dietary supplements you use. Also tell them if you smoke, drink alcohol, or use illegal drugs. Some items may interact with your medicine. What should I watch for while using this medicine? Notify your physician immediately if you become short of breath while doing your normal activities. Do not take this medicine within 6 hours of bedtime. It can keep you from getting to sleep. Avoid drinks that contain caffeine and try to stick to a regular bedtime every night. This medicine was intended to be used in addition to a healthy diet and exercise. The best results are achieved this way. This medicine is only indicated for short-term use. Eventually your weight loss may level out. At that point, the drug will only help you maintain your new weight. Do not increase or in any way change your dose without consulting your doctor. You may get drowsy or dizzy. Do not drive, use machinery, or do anything that needs mental alertness until you know how this medicine affects you. Do not stand or sit up quickly, especially if you are an older patient. This reduces the  risk of dizzy or fainting spells. Alcohol may increase dizziness and drowsiness. Avoid alcoholic drinks. What side effects may I notice from receiving this medicine? Side effects  that you should report to your doctor or health care professional as soon as possible: -chest pain, palpitations -depression or severe changes in mood -increased blood pressure -irritability -nervousness or restlessness -severe dizziness -shortness of breath -problems urinating -unusual swelling of the legs -vomiting Side effects that usually do not require medical attention (report to your doctor or health care professional if they continue or are bothersome): -blurred vision or other eye problems -changes in sexual ability or desire -constipation or diarrhea -difficulty sleeping -dry mouth or unpleasant taste -headache -nausea This list may not describe all possible side effects. Call your doctor for medical advice about side effects. You may report side effects to FDA at 1-800-FDA-1088. Where should I keep my medicine? Keep out of the reach of children. This medicine can be abused. Keep your medicine in a safe place to protect it from theft. Do not share this medicine with anyone. Selling or giving away this medicine is dangerous and against the law. This medicine may cause accidental overdose and death if taken by other adults, children, or pets. Mix any unused medicine with a substance like cat litter or coffee grounds. Then throw the medicine away in a sealed container like a sealed bag or a coffee can with a lid. Do not use the medicine after the expiration date. Store at room temperature between 20 and 25 degrees C (68 and 77 degrees F). Keep container tightly closed. NOTE: This sheet is a summary. It may not cover all possible information. If you have questions about this medicine, talk to your doctor, pharmacist, or health care provider.  2018 Elsevier/Gold Standard (2015-02-25 12:53:15)

## 2018-04-09 ENCOUNTER — Encounter: Payer: Self-pay | Admitting: Internal Medicine

## 2018-04-09 LAB — THYROID PEROXIDASE ANTIBODY: THYROID PEROXIDASE ANTIBODY: 1 [IU]/mL (ref <9)

## 2018-04-22 DIAGNOSIS — D2371 Other benign neoplasm of skin of right lower limb, including hip: Secondary | ICD-10-CM | POA: Diagnosis not present

## 2018-04-22 DIAGNOSIS — L7211 Pilar cyst: Secondary | ICD-10-CM | POA: Diagnosis not present

## 2018-05-06 DIAGNOSIS — R208 Other disturbances of skin sensation: Secondary | ICD-10-CM | POA: Diagnosis not present

## 2018-05-06 DIAGNOSIS — L7211 Pilar cyst: Secondary | ICD-10-CM | POA: Diagnosis not present

## 2018-05-16 DIAGNOSIS — T8141XA Infection following a procedure, superficial incisional surgical site, initial encounter: Secondary | ICD-10-CM | POA: Diagnosis not present

## 2018-05-22 ENCOUNTER — Other Ambulatory Visit: Payer: Self-pay | Admitting: Obstetrics and Gynecology

## 2018-05-27 ENCOUNTER — Other Ambulatory Visit: Payer: Self-pay | Admitting: Internal Medicine

## 2018-05-27 DIAGNOSIS — E669 Obesity, unspecified: Secondary | ICD-10-CM

## 2018-05-27 MED ORDER — PHENTERMINE HCL 37.5 MG PO TABS: 37.5000 mg | ORAL_TABLET | Freq: Every day | ORAL | 0 refills | Status: DC

## 2018-06-18 ENCOUNTER — Encounter: Payer: Self-pay | Admitting: Internal Medicine

## 2018-06-18 ENCOUNTER — Ambulatory Visit (INDEPENDENT_AMBULATORY_CARE_PROVIDER_SITE_OTHER): Payer: 59 | Admitting: Internal Medicine

## 2018-06-18 VITALS — BP 126/70 | HR 81 | Temp 97.9°F | Ht 64.0 in | Wt 177.6 lb

## 2018-06-18 DIAGNOSIS — Z0000 Encounter for general adult medical examination without abnormal findings: Secondary | ICD-10-CM | POA: Diagnosis not present

## 2018-06-18 DIAGNOSIS — E669 Obesity, unspecified: Secondary | ICD-10-CM | POA: Diagnosis not present

## 2018-06-18 MED ORDER — PHENTERMINE HCL 37.5 MG PO TABS: 37.5000 mg | ORAL_TABLET | Freq: Every day | ORAL | 0 refills | Status: DC

## 2018-06-18 NOTE — Progress Notes (Signed)
Chief Complaint  Patient presents with  . Follow-up   Annual and 2 month f/u  1. Obesity lost 20+ lbs on adipex and likes had 3 months of therapy goal is to get down to size 8  2. Reviewed labs  02/2018 no complaints today  3. Cysts x 2 removed scalp by dermatology and became infected after 05/06/18 and on abx x 5 days and had to have wound cx improved now    ROS Past Medical History:  Diagnosis Date  . Anxiety   . GERD (gastroesophageal reflux disease)   . Herpes genitalis in women   . History of kidney stones    h/o  . Low-lying placenta    Past Surgical History:  Procedure Laterality Date  . ABDOMINAL HYSTERECTOMY     total Dr. Enzo Bi 05/13/17 no h/o abnormal pap for endometriosis and right ovarian cyst  . DIAGNOSTIC LAPAROSCOPY    . ESOPHAGOGASTRODUODENOSCOPY (EGD) WITH PROPOFOL N/A 06/19/2016   Procedure: ESOPHAGOGASTRODUODENOSCOPY (EGD) WITH PROPOFOL;  Surgeon: Lucilla Lame, MD;  Location: [facility];  Service: Endoscopy;  Laterality: N/A;  . LAPAROSCOPIC VAGINAL HYSTERECTOMY WITH SALPINGO OOPHORECTOMY Bilateral 05/13/2017   Procedure: LAPAROSCOPIC ASSISTED VAGINAL HYSTERECTOMY WITH BILATERAL SALPINGO OOPHORECTOMY;  Surgeon: Brayton Mars, MD;  Location: [facility];  Service: Gynecology;  Laterality: Bilateral;  . LAPROSCOPY    . REMOVAL OF DRUG DELIVERY IMPLANT Left 05/13/2017   Procedure: REMOVAL OF DRUG DELIVERY IMPLANT, LEFT ARM;  Surgeon: Brayton Mars, MD;  Location: [facility];  Service: Gynecology;  Laterality: Left;  . TONSILLECTOMY    . TONSILLECTOMY     Family History  Problem Relation Age of Onset  . Diabetes Maternal Grandmother   . Diabetes Maternal Grandfather   . Diabetes Mother        type 2  . Diabetes Sister        type 2   . Asthma Daughter   . Diabetes Maternal Aunt        type 2  . Asthma Daughter   . Diabetes Maternal Aunt        type 2   Social History   Socioeconomic History  . Marital status: Married    Spouse  name: Not on file  . Number of children: Not on file  . Years of education: Not on file  . Highest education level: Not on file  Occupational History  . Not on file  Social Needs  . Financial resource strain: Not on file  . Food insecurity:    Worry: Not on file    Inability: Not on file  . Transportation needs:    Medical: Not on file    Non-medical: Not on file  Tobacco Use  . Smoking status: Never Smoker  . Smokeless tobacco: Never Used  Substance and Sexual Activity  . Alcohol use: No  . Drug use: No  . Sexual activity: Yes    Birth control/protection: None, Surgical  Lifestyle  . Physical activity:    Days per week: Not on file    Minutes per session: Not on file  . Stress: Not on file  Relationships  . Social connections:    Talks on phone: Not on file    Gets together: Not on file    Attends religious service: Not on file    Active member of club or organization: Not on file    Attends meetings of clubs or organizations: Not on file    Relationship status: Not on file  . Intimate  partner violence:    Fear of current or ex partner: Not on file    Emotionally abused: Not on file    Physically abused: Not on file    Forced sexual activity: Not on file  Other Topics Concern  . 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 calcium-vitamin D (OSCAL WITH D) 500-200 MG-UNIT tablet Take 1 tablet by mouth.  . Cholecalciferol (VITAMIN D-3) 5000 units TABS Take by mouth.  . escitalopram (LEXAPRO) 10 MG tablet TAKE 1 TABLET BY MOUTH ONCE DAILY  . estradiol (VIVELLE-DOT) 0.1 MG/24HR patch PLACE 1 PATCH (0.1 MG TOTAL) ONTO THE SKIN 2 TIMES A WEEK.  . lansoprazole (PREVACID) 30 MG capsule Take 1 capsule (30 mg total) by mouth at bedtime.  . phentermine (ADIPEX-P) 37.5 MG tablet Take 1 tablet (37.5 mg total) by mouth daily before breakfast. appt further  refills   No Known Allergies Recent Results (from the past 2160 hour(s))  TSH     Status: None   Collection Time: 04/08/18  8:26 AM  Result Value Ref Range   TSH 0.65 0.35 - 4.50 uIU/mL  T4, free     Status: None   Collection Time: 04/08/18  8:26 AM  Result Value Ref Range   Free T4 0.64 0.60 - 1.60 ng/dL    Comment: Specimens from patients who are undergoing biotin therapy and /or ingesting biotin supplements may contain high levels of biotin.  The higher biotin concentration in these specimens interferes with this Free T4 assay.  Specimens that contain high levels  of biotin may cause false high results for this Free T4 assay.  Please interpret results in light of the total clinical presentation of the patient.    T3, free     Status: None   Collection Time: 04/08/18  8:26 AM  Result Value Ref Range   T3, Free 3.2 2.3 - 4.2 pg/mL  Thyroid peroxidase antibody     Status: None   Collection Time: 04/08/18  8:26 AM  Result Value Ref Range   Thyroperoxidase Ab SerPl-aCnc 1 <9 IU/mL   Objective  Body mass index is 30.48 kg/m. Wt Readings from Last 3 Encounters:  06/18/18 177 lb 9.6 oz (80.6 kg)  04/08/18 195 lb 3.2 oz (88.5 kg)  02/04/18 195 lb 8 oz (88.7 kg)   Temp Readings from Last 3 Encounters:  06/18/18 97.9 F (36.6 C) (Oral)  04/08/18 97.9 F (36.6 C) (Oral)  02/04/18 98 F (36.7 C) (Oral)   BP Readings from Last 3 Encounters:  06/18/18 126/70  04/08/18 104/78  02/04/18 118/78   Pulse Readings from Last 3 Encounters:  06/18/18 81  04/08/18 73  02/04/18 72    Physical Exam Vitals signs and nursing note reviewed.  Constitutional:      Appearance: Normal appearance. She is well-developed and normal weight.  HENT:     Head: Normocephalic and atraumatic.     Nose: Nose normal.     Mouth/Throat:     Mouth: Mucous membranes are moist.     Pharynx: Oropharynx is clear.  Eyes:     Conjunctiva/sclera: Conjunctivae normal.     Pupils: Pupils are equal, round,  and reactive to light.  Cardiovascular:     Rate and Rhythm: Normal rate and regular   rhythm.     Heart sounds: Normal heart sounds.  Pulmonary:     Effort: Pulmonary effort is normal.     Breath sounds: Normal breath sounds.  Skin:    General: Skin is warm and dry.  Neurological:     General: No focal deficit present.     Mental Status: She is alert and oriented to person, place, and time. Mental status is at baseline.     Gait: Gait normal.  Psychiatric:        Attention and Perception: Attention and perception normal.        Mood and Affect: Mood and affect normal.        Speech: Speech normal.        Behavior: Behavior normal. Behavior is cooperative.        Thought Content: Thought content normal.        Cognition and Memory: Cognition and memory normal.        Judgment: Judgment normal.     Assessment   1. Annual  2. Obesity w/ wt loss >20 lbs BMI 30.48  Plan  Flu shot 03/26/18 Tdap had 02/09/15  MMR and hep B immune    Reviewed labs 02/2018  S/p total hysterectomy 05/23/17 no h/o abnormal pap was for endometriosis and right ovarian cyst rec f/u with Dr. D as sch 07/2018 and ask if pap still rec.   [facility]  [facility]  [facility] Dr. DeFrancesco  Est derm  D3 2000 IU qd  2. Refilled adipex 30 mg qd break 3 months then resume another 4 months  Provider: Dr. Tracy McLean-Scocuzza-Internal Medicine  

## 2018-06-18 NOTE — Patient Instructions (Addendum)
D3 2000 IU daily   More fiber, Premier protein shake meal replacement, blueberries, mushrooms, almonds, walnuts  Healthy snacks   Call back in 3 months

## 2018-06-18 NOTE — Progress Notes (Signed)
Pre visit review using our clinic review tool, if applicable. No additional management support is needed unless otherwise documented below in the visit note. 

## 2018-06-25 DIAGNOSIS — H5213 Myopia, bilateral: Secondary | ICD-10-CM | POA: Diagnosis not present

## 2018-06-26 ENCOUNTER — Ambulatory Visit (INDEPENDENT_AMBULATORY_CARE_PROVIDER_SITE_OTHER): Payer: 59 | Admitting: Obstetrics and Gynecology

## 2018-06-26 ENCOUNTER — Encounter: Payer: Self-pay | Admitting: Obstetrics and Gynecology

## 2018-06-26 VITALS — BP 119/79 | HR 88 | Ht 66.0 in | Wt 174.0 lb

## 2018-06-26 DIAGNOSIS — Z01419 Encounter for gynecological examination (general) (routine) without abnormal findings: Secondary | ICD-10-CM

## 2018-06-26 MED ORDER — ESTRADIOL 0.1 MG/24HR TD PTTW: MEDICATED_PATCH | TRANSDERMAL | 4 refills | Status: DC

## 2018-06-26 NOTE — Patient Instructions (Signed)
 Preventive Care 18-39 Years, Female Preventive care refers to lifestyle choices and visits with your health care provider that can promote health and wellness. What does preventive care include?   A yearly physical exam. This is also called an annual well check.  Dental exams once or twice a year.  Routine eye exams. Ask your health care provider how often you should have your eyes checked.  Personal lifestyle choices, including: ? Daily care of your teeth and gums. ? Regular physical activity. ? Eating a healthy diet. ? Avoiding tobacco and drug use. ? Limiting alcohol use. ? Practicing safe sex. ? Taking vitamin and mineral supplements as recommended by your health care provider. What happens during an annual well check? The services and screenings done by your health care provider during your annual well check will depend on your age, overall health, lifestyle risk factors, and family history of disease. Counseling Your health care provider may ask you questions about your:  Alcohol use.  Tobacco use.  Drug use.  Emotional well-being.  Home and relationship well-being.  Sexual activity.  Eating habits.  Work and work environment.  Method of birth control.  Menstrual cycle.  Pregnancy history. Screening You may have the following tests or measurements:  Height, weight, and BMI.  Diabetes screening. This is done by checking your blood sugar (glucose) after you have not eaten for a while (fasting).  Blood pressure.  Lipid and cholesterol levels. These may be checked every 5 years starting at age 20.  Skin check.  Hepatitis C blood test.  Hepatitis B blood test.  Sexually transmitted disease (STD) testing.  BRCA-related cancer screening. This may be done if you have a family history of breast, ovarian, tubal, or peritoneal cancers.  Pelvic exam and Pap test. This may be done every 3 years starting at age 21. Starting at age 30, this may be done  every 5 years if you have a Pap test in combination with an HPV test. Discuss your test results, treatment options, and if necessary, the need for more tests with your health care provider. Vaccines Your health care provider may recommend certain vaccines, such as:  Influenza vaccine. This is recommended every year.  Tetanus, diphtheria, and acellular pertussis (Tdap, Td) vaccine. You may need a Td booster every 10 years.  Varicella vaccine. You may need this if you have not been vaccinated.  HPV vaccine. If you are 26 or younger, you may need three doses over 6 months.  Measles, mumps, and rubella (MMR) vaccine. You may need at least one dose of MMR. You may also need a second dose.  Pneumococcal 13-valent conjugate (PCV13) vaccine. You may need this if you have certain conditions and were not previously vaccinated.  Pneumococcal polysaccharide (PPSV23) vaccine. You may need one or two doses if you smoke cigarettes or if you have certain conditions.  Meningococcal vaccine. One dose is recommended if you are age 19-21 years and a first-year college student living in a residence hall, or if you have one of several medical conditions. You may also need additional booster doses.  Hepatitis A vaccine. You may need this if you have certain conditions or if you travel or work in places where you may be exposed to hepatitis A.  Hepatitis B vaccine. You may need this if you have certain conditions or if you travel or work in places where you may be exposed to hepatitis B.  Haemophilus influenzae type b (Hib) vaccine. You may need this if   you have certain risk factors. Talk to your health care provider about which screenings and vaccines you need and how often you need them. This information is not intended to replace advice given to you by your health care provider. Make sure you discuss any questions you have with your health care provider. Document Released: 07/17/2001 Document Revised:  01/01/2017 Document Reviewed: 03/22/2015 Elsevier Interactive Patient Education  2019 Elsevier Inc.  

## 2018-06-26 NOTE — Progress Notes (Signed)
Subjective:   Rebecca Evans is a 33 y.o. G46P1001 Caucasian female here for a routine well-woman exam.  No LMP recorded (lmp unknown). Patient has had a hysterectomy.    Current complaints: none PCP: Mclean-Scocuzza       doesn't desire labs  Social History: Sexual: heterosexual Marital Status: married Living situation: with family Occupation: [employer] Tobacco/alcohol: no tobacco use Illicit drugs: no history of illicit drug use  The following portions of the patient's history were reviewed and updated as appropriate: allergies, current medications, past family history, past medical history, past social history, past surgical history and problem list.  Past Medical History Past Medical History:  Diagnosis Date  . Anxiety   . GERD (gastroesophageal reflux disease)   . Herpes genitalis in women   . History of kidney stones    h/o  . Low-lying placenta   . Vitamin D deficiency     Past Surgical History Past Surgical History:  Procedure Laterality Date  . ABDOMINAL HYSTERECTOMY     total Dr. Enzo Bi 05/13/17 no h/o abnormal pap for endometriosis and right ovarian cyst  . DIAGNOSTIC LAPAROSCOPY    . ESOPHAGOGASTRODUODENOSCOPY (EGD) WITH PROPOFOL N/A 06/19/2016   Procedure: ESOPHAGOGASTRODUODENOSCOPY (EGD) WITH PROPOFOL;  Surgeon: Lucilla Lame, MD;  Location: [facility];  Service: Endoscopy;  Laterality: N/A;  . LAPAROSCOPIC VAGINAL HYSTERECTOMY WITH SALPINGO OOPHORECTOMY Bilateral 05/13/2017   Procedure: LAPAROSCOPIC ASSISTED VAGINAL HYSTERECTOMY WITH BILATERAL SALPINGO OOPHORECTOMY;  Surgeon: Brayton Mars, MD;  Location: [facility];  Service: Gynecology;  Laterality: Bilateral;  . LAPROSCOPY    . REMOVAL OF DRUG DELIVERY IMPLANT Left 05/13/2017   Procedure: REMOVAL OF DRUG DELIVERY IMPLANT, LEFT ARM;  Surgeon: Brayton Mars, MD;  Location: [facility];  Service: Gynecology;  Laterality: Left;  . TONSILLECTOMY    . TONSILLECTOMY      Gynecologic  History G2P1001  No LMP recorded (lmp unknown). Patient has had a hysterectomy. Contraception: status post hysterectomy Last Pap: 02/2017. Results were: normal   Obstetric History OB History  Gravida Para Term Preterm AB Living  2 1 1     1   SAB TAB Ectopic Multiple Live Births          1    # Outcome Date GA Lbr Len/2nd Weight Sex Delivery Anes PTL Lv  2 Term 05/07/13    F Vag-Spont   LIV  1 Gravida             Current Medications Current Outpatient Medications on File Prior to Visit  Medication Sig Dispense Refill  . calcium-vitamin D (OSCAL WITH D) 500-200 MG-UNIT tablet Take 1 tablet by mouth.    . Cholecalciferol (VITAMIN D-3) 5000 units TABS Take by mouth.    . escitalopram (LEXAPRO) 10 MG tablet TAKE 1 TABLET BY MOUTH ONCE DAILY 30 tablet 6  . estradiol (VIVELLE-DOT) 0.1 MG/24HR patch PLACE 1 PATCH (0.1 MG TOTAL) ONTO THE SKIN 2 TIMES A WEEK. 8 patch 1  . lansoprazole (PREVACID) 30 MG capsule Take 1 capsule (30 mg total) by mouth at bedtime. 30 capsule 2  . phentermine (ADIPEX-P) 37.5 MG tablet Take 1 tablet (37.5 mg total) by mouth daily before breakfast. appt further refills 30 tablet 0   No current facility-administered medications on file prior to visit.     Review of Systems Patient denies any headaches, blurred vision, shortness of breath, chest pain, abdominal pain, problems with bowel movements, urination, or intercourse.  Objective:  BP 119/79   Pulse 88   Ht  5\' 6"  (1.676 m)   Wt 174 lb (78.9 kg)   LMP  (LMP Unknown)   BMI 28.08 kg/m  Physical Exam  General:  Well developed, well nourished, no acute distress. She is alert and oriented x3. Skin:  Warm and dry Neck:  Midline trachea, no thyromegaly or nodules Cardiovascular: Regular rate and rhythm, no murmur heard Lungs:  Effort normal, all lung fields clear to auscultation bilaterally Breasts:  No dominant palpable mass, retraction, or nipple discharge Abdomen:  Soft, non tender, no hepatosplenomegaly  or masses Pelvic:  External genitalia is normal in appearance.  The vagina is normal in appearance. The cervix is bulbous, no CMT.  Thin prep pap is not done. Uterus is surgically absent  No adnexal masses or tenderness noted. Extremities:  No swelling or varicosities noted Psych:  She has a normal mood and affect  Assessment:   Healthy well-woman exam S/p total hysteretcomy due to endometriosis Overweight/BMI 28  Plan:  ERT patches refilled. F/U 1 year for AE, or sooner if needed   Gary Gabrielsen Rockney Ghee, CNM

## 2018-07-08 ENCOUNTER — Encounter: Payer: 59 | Admitting: Obstetrics and Gynecology

## 2018-08-15 ENCOUNTER — Other Ambulatory Visit: Payer: Self-pay | Admitting: Obstetrics and Gynecology

## 2018-09-09 ENCOUNTER — Other Ambulatory Visit: Payer: Self-pay | Admitting: Obstetrics and Gynecology

## 2018-09-09 MED FILL — ESCITALOPRAM 10 MG TABLET: 10 | 30 days supply | Qty: 30 | Fill #0

## 2018-09-09 MED FILL — DOTTI 0.1 MG/24HR PTTW: 0.1 | 28 days supply | Qty: 8 | Fill #0

## 2018-09-10 ENCOUNTER — Other Ambulatory Visit: Payer: Self-pay

## 2018-09-10 MED ORDER — LANSOPRAZOLE 30 MG PO CPDR: 30.0000 mg | DELAYED_RELEASE_CAPSULE | Freq: Every day | ORAL | 2 refills | Status: DC

## 2018-09-10 MED FILL — LANSOPRAZOLE 30 MG CPDR: 30 | 30 days supply | Qty: 30 | Fill #0

## 2018-10-13 MED FILL — DOTTI 0.1 MG/24HR PTTW: 0.1 | 28 days supply | Qty: 8 | Fill #1

## 2018-10-20 MED FILL — ESCITALOPRAM 10 MG TABLET: 10 | 30 days supply | Qty: 30 | Fill #1

## 2018-10-24 ENCOUNTER — Encounter: Payer: Self-pay | Admitting: Family Medicine

## 2018-10-27 ENCOUNTER — Encounter: Payer: Self-pay | Admitting: Internal Medicine

## 2018-10-28 ENCOUNTER — Other Ambulatory Visit: Payer: Self-pay | Admitting: Internal Medicine

## 2018-10-28 DIAGNOSIS — E669 Obesity, unspecified: Secondary | ICD-10-CM

## 2018-10-28 MED ORDER — PHENTERMINE HCL 37.5 MG PO TABS: 37.5000 mg | ORAL_TABLET | Freq: Every day | ORAL | 0 refills | Status: DC

## 2018-11-11 MED FILL — DOTTI 0.1 MG/24HR PTTW: 0.1 | 28 days supply | Qty: 8 | Fill #2

## 2018-11-12 MED FILL — ESCITALOPRAM 10 MG TABLET: 10 | 30 days supply | Qty: 30 | Fill #2

## 2018-12-08 MED FILL — DOTTI 0.1 MG/24HR PTTW: 0.1 | 28 days supply | Qty: 8 | Fill #3

## 2018-12-18 ENCOUNTER — Ambulatory Visit (INDEPENDENT_AMBULATORY_CARE_PROVIDER_SITE_OTHER): Payer: 59 | Admitting: Internal Medicine

## 2018-12-18 ENCOUNTER — Other Ambulatory Visit: Payer: Self-pay

## 2018-12-18 ENCOUNTER — Encounter: Payer: Self-pay | Admitting: Internal Medicine

## 2018-12-18 VITALS — Ht 66.0 in | Wt 164.0 lb

## 2018-12-18 DIAGNOSIS — E663 Overweight: Secondary | ICD-10-CM

## 2018-12-18 DIAGNOSIS — R5383 Other fatigue: Secondary | ICD-10-CM | POA: Diagnosis not present

## 2018-12-18 DIAGNOSIS — Z1389 Encounter for screening for other disorder: Secondary | ICD-10-CM

## 2018-12-18 DIAGNOSIS — E785 Hyperlipidemia, unspecified: Secondary | ICD-10-CM | POA: Diagnosis not present

## 2018-12-18 DIAGNOSIS — Z0000 Encounter for general adult medical examination without abnormal findings: Secondary | ICD-10-CM

## 2018-12-18 DIAGNOSIS — Z1329 Encounter for screening for other suspected endocrine disorder: Secondary | ICD-10-CM

## 2018-12-18 DIAGNOSIS — E559 Vitamin D deficiency, unspecified: Secondary | ICD-10-CM

## 2018-12-18 DIAGNOSIS — F419 Anxiety disorder, unspecified: Secondary | ICD-10-CM

## 2018-12-18 NOTE — Progress Notes (Signed)
Virtual Visit via Video Note  I connected with Rebecca Evans   on 12/18/18 at  8:15 AM EDT by a video enabled telemedicine application and verified that I am speaking with the correct person using two identifiers.  Location patient: home Location provider:work  Persons participating in the virtual visit: patient, provider  I discussed the limitations of evaluation and management by telemedicine and the availability of in person appointments. The patient expressed understanding and agreed to proceed.   HPI: 1. Overweight weight is down on Adipex x 2 month supply but was on prior she has 5 pills left wt down to 164 disc giving her body a break for 3 months and she is agreeable. She is exercising at times and doing wt watchers goal is to be in size 8  2. Fatigue sleeping 6-8 hrs exercise at times 30 min doing beach body, vitamin D def, normal thyroid function she is taking D3 5000 IU daily. She reports once off adipex she feels more fatigue disc with pt this could be b/c the medication is a stimulant  3. Anxiety increased on lexapro 10 mg qd she does not want to increase dose   ROS: See pertinent positives and negatives per HPI.  Past Medical History:  Diagnosis Date  . Anxiety   . GERD (gastroesophageal reflux disease)   . Herpes genitalis in women   . History of kidney stones    h/o  . Low-lying placenta   . Vitamin D deficiency     Past Surgical History:  Procedure Laterality Date  . ABDOMINAL HYSTERECTOMY     total Dr. Enzo Bi 05/13/17 no h/o abnormal pap for endometriosis and right ovarian cyst  . DIAGNOSTIC LAPAROSCOPY    . ESOPHAGOGASTRODUODENOSCOPY (EGD) WITH PROPOFOL N/A 06/19/2016   Procedure: ESOPHAGOGASTRODUODENOSCOPY (EGD) WITH PROPOFOL;  Surgeon: Lucilla Lame, MD;  Location: [facility];  Service: Endoscopy;  Laterality: N/A;  . LAPAROSCOPIC VAGINAL HYSTERECTOMY WITH SALPINGO OOPHORECTOMY Bilateral 05/13/2017   Procedure: LAPAROSCOPIC ASSISTED VAGINAL HYSTERECTOMY  WITH BILATERAL SALPINGO OOPHORECTOMY;  Surgeon: Brayton Mars, MD;  Location: [facility];  Service: Gynecology;  Laterality: Bilateral;  . LAPROSCOPY    . REMOVAL OF DRUG DELIVERY IMPLANT Left 05/13/2017   Procedure: REMOVAL OF DRUG DELIVERY IMPLANT, LEFT ARM;  Surgeon: Brayton Mars, MD;  Location: [facility];  Service: Gynecology;  Laterality: Left;  . TONSILLECTOMY    . TONSILLECTOMY      Family History  Problem Relation Age of Onset  . Diabetes Maternal Grandmother   . Diabetes Maternal Grandfather   . Diabetes Mother        type 2  . Diabetes Sister        type 2   . Asthma Daughter   . Diabetes Maternal Aunt        type 2  . Asthma Daughter   . 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  calcium-vitamin D (OSCAL WITH D) 500-200 MG-UNIT tablet, Take 1 tablet by mouth., Disp: , Rfl:  .  Cholecalciferol (VITAMIN D-3) 5000 units TABS, Take by mouth., Disp: , Rfl:  .  escitalopram (LEXAPRO) 10 MG tablet, TAKE 1 TABLET BY MOUTH ONCE DAILY, Disp: 30 tablet, Rfl: 6 .  estradiol (VIVELLE-DOT) 0.1 MG/24HR patch, PLACE 1 PATCH (0.1 MG TOTAL) ONTO THE SKIN 2  TIMES A WEEK., Disp: 24 patch, Rfl: 4 .  lansoprazole (PREVACID) 30 MG capsule, TAKE 1 CAPSULE BY MOUTH AT BEDTIME, Disp: 30 capsule, Rfl: 2 .  lansoprazole (PREVACID) 30 MG capsule, Take 1 capsule (30 mg total) by mouth at bedtime., Disp: 30 capsule, Rfl: 2 .  phentermine (ADIPEX-P) 37.5 MG tablet, Take 1 tablet (37.5 mg total) by mouth daily before breakfast. appt further refills, Disp: 60 tablet, Rfl: 0  EXAM:  VITALS per patient if applicable:  GENERAL: alert, oriented, appears well and in no acute distress  HEENT: atraumatic, conjunttiva clear, no obvious abnormalities on inspection of external nose and ears  NECK: normal movements of the head and neck  LUNGS: on inspection no signs  of respiratory distress, breathing rate appears normal, no obvious gross SOB, gasping or wheezing  CV: no obvious cyanosis  MS: moves all visible extremities without noticeable abnormality  PSYCH/NEURO: pleasant and cooperative, no obvious depression or anxiety, speech and thought processing grossly intact  ASSESSMENT AND PLAN:  Discussed the following assessment and plan:  Fatigue, unspecified type - Plan: sch labs upcoming comp labs rec exercise and healthy diet choices   Overweight (BMI 25.0-29.9) - Plan: hold adipex until 04/25/19 my chart for refill of 4 month supply at this time   Vitamin D deficiency - Plan: Vitamin D (25 hydroxy), on D3 5000 IU qd   Hyperlipidemia, unspecified hyperlipidemia type - Plan: Lipid panel  Anxiety - Plan: cont lexapro 10 mg qd   HM-annual due 06/19/19  Flu shot 03/26/18 Tdap had 02/09/15  MMR and hep B immune     S/p total hysterectomy 05/23/17 no h/o abnormal pap was for endometriosis and right ovarian cyst rec f/u with Dr. Keturah Barre as sch 07/2018 and ask if pap still rec.   [place]  [facility]  [facility] Dr. Enzo Bi Est derm  D3 2000 to 5000 IU qd    I discussed the assessment and treatment plan with the patient. The patient was provided an opportunity to ask questions and all were answered. The patient agreed with the plan and demonstrated an understanding of the instructions.   The patient was advised to call back or seek an in-person evaluation if the symptoms worsen or if the condition fails to improve as anticipated.  Time spent 15 minutes  Delorise Jackson, MD

## 2018-12-23 MED FILL — ESCITALOPRAM 10 MG TABLET: 10 | 30 days supply | Qty: 30 | Fill #3

## 2019-01-05 MED FILL — DOTTI 0.1 MG/24HR PTTW: 0.1 | 28 days supply | Qty: 8 | Fill #4

## 2019-01-20 MED FILL — ESCITALOPRAM 10 MG TABLET: 10 | 30 days supply | Qty: 30 | Fill #4

## 2019-01-22 ENCOUNTER — Ambulatory Visit (INDEPENDENT_AMBULATORY_CARE_PROVIDER_SITE_OTHER): Payer: 59 | Admitting: Family Medicine

## 2019-01-22 ENCOUNTER — Other Ambulatory Visit: Payer: Self-pay

## 2019-01-22 DIAGNOSIS — B373 Candidiasis of vulva and vagina: Secondary | ICD-10-CM

## 2019-01-22 DIAGNOSIS — J019 Acute sinusitis, unspecified: Secondary | ICD-10-CM

## 2019-01-22 DIAGNOSIS — B3731 Acute candidiasis of vulva and vagina: Secondary | ICD-10-CM

## 2019-01-22 MED ORDER — FLUCONAZOLE 150 MG PO TABS: 150.0000 mg | ORAL_TABLET | Freq: Once | ORAL | 0 refills | Status: AC

## 2019-01-22 MED ORDER — AMOXICILLIN-POT CLAVULANATE 875-125 MG PO TABS: 1.0000 | ORAL_TABLET | Freq: Two times a day (BID) | ORAL | 0 refills | Status: DC

## 2019-01-22 NOTE — Progress Notes (Signed)
Patient ID: Rebecca Evans, female   DOB: January 07, 1986, 33 y.o.   MRN: [id number]    Virtual Visit via video Note  This visit type was conducted due to national recommendations for restrictions regarding the COVID-19 pandemic (e.g. social distancing).  This format is felt to be most appropriate for this patient at this time.  All issues noted in this document were discussed and addressed.  No physical exam was performed (except for noted visual exam findings with Video Visits).   I connected with Rebecca Evans today at  1:20 PM EDT by a video enabled telemedicine application and verified that I am speaking with the correct person using two identifiers. Location patient: home Location provider: work or home office Persons participating in the virtual visit: patient, provider  I discussed the limitations, risks, security and privacy concerns of performing an evaluation and management service by video and the availability of in person appointments. I also discussed with the patient that there may be a patient responsible charge related to this service. The patient expressed understanding and agreed to proceed.   HPI:   Patient and I connected via video due to sinus congestion, thick nasal drainage the back of throat and sinus pressure for 3 weeks.  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  States she has taken oral antihistamine, done nasal sprays and saline nasal rinses without effect in reducing sinus congestion symptoms.  States when blows her nose it is usually thick and yellow.  Denies cough.  Denies fever or chills.  Denies shortness of breath or wheezing.  Denies body aches.  Denies vomiting or diarrhea.   ROS: See pertinent positives and negatives per HPI.  Past Medical History:  Diagnosis Date  . Anxiety   . GERD (gastroesophageal reflux disease)   . Herpes genitalis in women   .  History of kidney stones    h/o  . Low-lying placenta   . Vitamin D deficiency     Past Surgical History:  Procedure Laterality Date  . ABDOMINAL HYSTERECTOMY     total Dr. Enzo Bi 05/13/17 no h/o abnormal pap for endometriosis and right ovarian cyst  . DIAGNOSTIC LAPAROSCOPY    . ESOPHAGOGASTRODUODENOSCOPY (EGD) WITH PROPOFOL N/A 06/19/2016   Procedure: ESOPHAGOGASTRODUODENOSCOPY (EGD) WITH PROPOFOL;  Surgeon: Lucilla Lame, MD;  Location: [facility];  Service: Endoscopy;  Laterality: N/A;  . LAPAROSCOPIC VAGINAL HYSTERECTOMY WITH SALPINGO OOPHORECTOMY Bilateral 05/13/2017   Procedure: LAPAROSCOPIC ASSISTED VAGINAL HYSTERECTOMY WITH BILATERAL SALPINGO OOPHORECTOMY;  Surgeon: Brayton Mars, MD;  Location: [facility];  Service: Gynecology;  Laterality: Bilateral;  . LAPROSCOPY    . REMOVAL OF DRUG DELIVERY IMPLANT Left 05/13/2017   Procedure: REMOVAL OF DRUG DELIVERY IMPLANT, LEFT ARM;  Surgeon: Brayton Mars, MD;  Location: [facility];  Service: Gynecology;  Laterality: Left;  . TONSILLECTOMY    . TONSILLECTOMY      Family History  Problem Relation Age of Onset  . Diabetes Maternal Grandmother   . Diabetes Maternal Grandfather   . Diabetes Mother        type 2  . Diabetes Sister        type 2   . Asthma Daughter   . Diabetes Maternal Aunt        type 2  . Asthma Daughter   . Diabetes Maternal Aunt        type 2   Social History  Tobacco Use  . Smoking status: Never Smoker  . Smokeless tobacco: Never Used  Substance Use Topics  . Alcohol use: No    Current Outpatient Medications:  .  calcium-vitamin D (OSCAL WITH D) 500-200 MG-UNIT tablet, Take 1 tablet by mouth., Disp: , Rfl:  .  Cholecalciferol (VITAMIN D-3) 5000 units TABS, Take by mouth., Disp: , Rfl:  .  escitalopram (LEXAPRO) 10 MG tablet, TAKE 1 TABLET BY MOUTH ONCE DAILY, Disp: 30 tablet, Rfl: 6 .  estradiol (VIVELLE-DOT) 0.1 MG/24HR patch, PLACE 1 PATCH (0.1 MG TOTAL) ONTO THE SKIN 2 TIMES A  WEEK., Disp: 24 patch, Rfl: 4 .  lansoprazole (PREVACID) 30 MG capsule, TAKE 1 CAPSULE BY MOUTH AT BEDTIME, Disp: 30 capsule, Rfl: 2 .  lansoprazole (PREVACID) 30 MG capsule, Take 1 capsule (30 mg total) by mouth at bedtime., Disp: 30 capsule, Rfl: 2 .  phentermine (ADIPEX-P) 37.5 MG tablet, Take 1 tablet (37.5 mg total) by mouth daily before breakfast. appt further refills, Disp: 60 tablet, Rfl: 0  EXAM:  GENERAL: alert, oriented, appears well and in no acute distress  HEENT: atraumatic, conjunttiva clear, no obvious abnormalities on inspection of external nose and ears.  NECK: normal movements of the head and neck  LUNGS: on inspection no signs of respiratory distress, breathing rate appears normal, no obvious gross SOB, gasping or wheezing  CV: no obvious cyanosis  MS: moves all visible extremities without noticeable abnormality  PSYCH/NEURO: pleasant and cooperative, no obvious depression or anxiety, speech and thought processing grossly intact  ASSESSMENT AND PLAN:  Discussed the following assessment and plan:  Acute sinusitis - patient symptoms do sound consistent with acute sinusitis infection.  We will treat with Augmentin twice daily for 10 days.  Advised to continue taking oral antihistamine and doing nasal spray as well as daily saline nasal flushes to help reduce congestion and rinse out sinuses.  Advised to keep up good fluid intake.  Advised to monitor for any worsening symptoms.  She wears a mask and is diligent with her handwashing.  Diflucan tablet sent in due to patient reporting she does get a yeast infection often times with antibiotic.  Advised to take daily probiotic or eat yogurt daily to help combat yeast infection development.   I discussed the assessment and treatment plan with the patient. The patient was provided an opportunity to ask questions and all were answered. The patient agreed with the plan and demonstrated an understanding of the instructions.    The patient was advised to call back or seek an in-person evaluation if the symptoms worsen or if the condition fails to improve as anticipated.  Jodelle Green, FNP

## 2019-02-04 MED FILL — DOTTI 0.1 MG/24HR PTTW: 0.1 | 28 days supply | Qty: 8 | Fill #5

## 2019-02-05 ENCOUNTER — Other Ambulatory Visit: Payer: 59

## 2019-02-12 ENCOUNTER — Other Ambulatory Visit: Payer: Self-pay

## 2019-02-12 ENCOUNTER — Other Ambulatory Visit (INDEPENDENT_AMBULATORY_CARE_PROVIDER_SITE_OTHER): Payer: 59

## 2019-02-12 DIAGNOSIS — Z1389 Encounter for screening for other disorder: Secondary | ICD-10-CM | POA: Diagnosis not present

## 2019-02-12 DIAGNOSIS — E559 Vitamin D deficiency, unspecified: Secondary | ICD-10-CM

## 2019-02-12 DIAGNOSIS — E785 Hyperlipidemia, unspecified: Secondary | ICD-10-CM | POA: Diagnosis not present

## 2019-02-12 DIAGNOSIS — Z1329 Encounter for screening for other suspected endocrine disorder: Secondary | ICD-10-CM

## 2019-02-12 DIAGNOSIS — Z0000 Encounter for general adult medical examination without abnormal findings: Secondary | ICD-10-CM | POA: Diagnosis not present

## 2019-02-12 LAB — COMPREHENSIVE METABOLIC PANEL
ALT: 18 U/L (ref 0–35)
AST: 15 U/L (ref 0–37)
Albumin: 3.9 g/dL (ref 3.5–5.2)
Alkaline Phosphatase: 105 U/L (ref 39–117)
BUN: 11 mg/dL (ref 6–23)
CO2: 26 mEq/L (ref 19–32)
Calcium: 8.9 mg/dL (ref 8.4–10.5)
Chloride: 105 mEq/L (ref 96–112)
Creatinine, Ser: 0.61 mg/dL (ref 0.40–1.20)
GFR: 113.39 mL/min (ref >60.00)
Glucose, Bld: 91 mg/dL (ref 70–99)
Potassium: 4 mEq/L (ref 3.5–5.1)
Sodium: 138 mEq/L (ref 135–145)
Total Bilirubin: 0.5 mg/dL (ref 0.2–1.2)
Total Protein: 7.5 g/dL (ref 6.0–8.3)

## 2019-02-12 LAB — LIPID PANEL
Cholesterol: 215 mg/dL — ABNORMAL HIGH (ref 0–200)
HDL: 47.2 mg/dL (ref >39.00)
LDL Cholesterol: 140 mg/dL — ABNORMAL HIGH (ref 0–99)
NonHDL: 167.45
Total CHOL/HDL Ratio: 5
Triglycerides: 138 mg/dL (ref 0.0–149.0)
VLDL: 27.6 mg/dL (ref 0.0–40.0)

## 2019-02-12 LAB — VITAMIN D 25 HYDROXY (VIT D DEFICIENCY, FRACTURES): VITD: 40.28 ng/mL (ref 30.00–100.00)

## 2019-02-12 LAB — CBC WITH DIFFERENTIAL/PLATELET
Basophils Absolute: 0.1 10*3/uL (ref 0.0–0.1)
Basophils Relative: 0.7 % (ref 0.0–3.0)
Eosinophils Absolute: 0.3 10*3/uL (ref 0.0–0.7)
Eosinophils Relative: 2.5 % (ref 0.0–5.0)
HCT: 41.5 % (ref 36.0–46.0)
Hemoglobin: 14 g/dL (ref 12.0–15.0)
Lymphocytes Relative: 39.6 % (ref 12.0–46.0)
Lymphs Abs: 4.5 10*3/uL — ABNORMAL HIGH (ref 0.7–4.0)
MCHC: 33.7 g/dL (ref 30.0–36.0)
MCV: 87.4 fl (ref 78.0–100.0)
Monocytes Absolute: 0.5 10*3/uL (ref 0.1–1.0)
Monocytes Relative: 4.2 % (ref 3.0–12.0)
Neutro Abs: 6 10*3/uL (ref 1.4–7.7)
Neutrophils Relative %: 53 % (ref 43.0–77.0)
Platelets: 355 10*3/uL (ref 150.0–400.0)
RBC: 4.74 Mil/uL (ref 3.87–5.11)
RDW: 13.2 % (ref 11.5–15.5)
WBC: 11.2 10*3/uL — ABNORMAL HIGH (ref 4.0–10.5)

## 2019-02-12 LAB — T4, FREE: Free T4: 0.81 ng/dL (ref 0.60–1.60)

## 2019-02-12 LAB — TSH: TSH: 0.78 u[IU]/mL (ref 0.35–4.50)

## 2019-02-13 LAB — URINALYSIS, ROUTINE W REFLEX MICROSCOPIC
Bilirubin Urine: NEGATIVE
Glucose, UA: NEGATIVE
Hgb urine dipstick: NEGATIVE
Ketones, ur: NEGATIVE
Leukocytes,Ua: NEGATIVE
Nitrite: NEGATIVE
Protein, ur: NEGATIVE
Specific Gravity, Urine: 1.017 (ref 1.001–1.03)
pH: 7 (ref 5.0–8.0)

## 2019-02-19 MED FILL — ESCITALOPRAM 10 MG TABLET: 10 | 30 days supply | Qty: 30 | Fill #5

## 2019-03-02 MED FILL — DOTTI 0.1 MG/24HR PTTW: 0.1 | 28 days supply | Qty: 8 | Fill #6

## 2019-03-18 ENCOUNTER — Encounter: Payer: Self-pay | Admitting: Internal Medicine

## 2019-03-23 ENCOUNTER — Other Ambulatory Visit: Payer: Self-pay | Admitting: Obstetrics and Gynecology

## 2019-03-23 MED FILL — ESCITALOPRAM 10 MG TABLET: 10 | 30 days supply | Qty: 30 | Fill #0

## 2019-03-31 ENCOUNTER — Encounter: Payer: Self-pay | Admitting: Internal Medicine

## 2019-04-01 ENCOUNTER — Other Ambulatory Visit: Payer: Self-pay | Admitting: Internal Medicine

## 2019-04-01 DIAGNOSIS — E669 Obesity, unspecified: Secondary | ICD-10-CM

## 2019-04-01 MED ORDER — PHENTERMINE HCL 37.5 MG PO TABS: 37.5000 mg | ORAL_TABLET | Freq: Every day | ORAL | 0 refills | Status: DC

## 2019-04-03 MED FILL — DOTTI 0.1 MG/24HR PTTW: 0.1 | 28 days supply | Qty: 8 | Fill #7

## 2019-04-21 MED FILL — ESCITALOPRAM 10 MG TABLET: 10 | 30 days supply | Qty: 30 | Fill #1

## 2019-04-26 ENCOUNTER — Other Ambulatory Visit: Payer: Self-pay

## 2019-04-26 ENCOUNTER — Ambulatory Visit
Admission: EM | Admit: 2019-04-26 | Discharge: 2019-04-26 | Disposition: A | Discharge status: Home / Self Care | Payer: 59 | Attending: Family Medicine | Admitting: Family Medicine

## 2019-04-26 DIAGNOSIS — J209 Acute bronchitis, unspecified: Secondary | ICD-10-CM | POA: Diagnosis not present

## 2019-04-26 DIAGNOSIS — R32 Unspecified urinary incontinence: Secondary | ICD-10-CM

## 2019-04-26 DIAGNOSIS — R05 Cough: Secondary | ICD-10-CM

## 2019-04-26 DIAGNOSIS — R059 Cough, unspecified: Secondary | ICD-10-CM

## 2019-04-26 DIAGNOSIS — B379 Candidiasis, unspecified: Secondary | ICD-10-CM | POA: Diagnosis not present

## 2019-04-26 LAB — URINALYSIS, COMPLETE (UACMP) WITH MICROSCOPIC
Bilirubin Urine: NEGATIVE
Glucose, UA: NEGATIVE mg/dL
Hgb urine dipstick: NEGATIVE
Ketones, ur: NEGATIVE mg/dL
Leukocytes,Ua: NEGATIVE
Nitrite: NEGATIVE
Protein, ur: NEGATIVE mg/dL
RBC / HPF: NONE SEEN RBC/hpf (ref 0–5)
Specific Gravity, Urine: 1.02 (ref 1.005–1.030)
pH: 7 (ref 5.0–8.0)

## 2019-04-26 LAB — WET PREP, GENITAL
Clue Cells Wet Prep HPF POC: NONE SEEN
Sperm: NONE SEEN
Trich, Wet Prep: NONE SEEN

## 2019-04-26 MED ORDER — PREDNISONE 20 MG PO TABS: 40.0000 mg | ORAL_TABLET | Freq: Every day | ORAL | 0 refills | Status: AC

## 2019-04-26 MED ORDER — AZITHROMYCIN 250 MG PO TABS: 250.0000 mg | ORAL_TABLET | Freq: Every day | ORAL | 0 refills | Status: DC

## 2019-04-26 MED ORDER — FLUCONAZOLE 150 MG PO TABS: ORAL_TABLET | ORAL | 0 refills | Status: AC

## 2019-04-26 MED ORDER — CHERATUSSIN DAC 30-10-100 MG/5ML PO SOLN: 10.0000 mL | Freq: Three times a day (TID) | ORAL | 0 refills | Status: AC | PRN

## 2019-04-26 NOTE — ED Triage Notes (Signed)
Patient states that she has been having a cough x 10 days and states that this has all settled in her chest. Reports that she is covid negative, reports that she is tested every Monday.   Patient states that she has been having vaginal irritation x 1 week and thinks this is related to a yeast infection.  Patient states that she has been noticing some urinary incontinence and is concerned she has a UTI.

## 2019-04-26 NOTE — Discharge Instructions (Addendum)
BRONCHITIS: Take Mucinex D throughout the day and drink plenty of fluids to break up the mucus . Take cough syrup at bedtime if needed, for cough and to assist with sleep. Take Ibuprofen or other NSAID for relief of any pleuritic pain. Increase rest and fluid intake. Return to the clinic, your PCP, or ER if you have any new/ worsening symptoms such as fever,  chest pain, difficulty breathing, worsening cough, mental status changes, lethargy, etc.        YEAST INFECTION: Begin diflucan. This is likely the cause of the dysuia, but will will send out a culture to make sure there is no urinary tract infection. There does not appear to be on the urinalysis. Follow up with your gynecologist about incontinence problems, but control of the cough should improve this.

## 2019-04-26 NOTE — ED Provider Notes (Signed)
[facility]    CSN: [id number] Arrival date & time: 04/26/19  0857      History   Chief Complaint Chief Complaint  Patient presents with  . Cough  . Vaginitis  . Urinary Incontinence    HPI Rebecca Evans is a 33 y.o. female. Patient presents today with multiple complaints. She says that she has had a cough x 1.5 weeks that has not improved. Cough is productive and she says none of the OTC medications have helped. She cannot sleep at night and the cough is starting to lead to coughing fit and chest pain. She she is coughing so much that she is having some minor urinary incontinence. She has been COVID tested with negative results about 5-6 days ago and does not want repeat testing. Denies fever, fatigue, nasal congestion, sore throat, smell/taste changes, body aches, breathing difficulty. No history of cardiopulmonary disease.   Patient also complains of vaginal itching and irritation as well as burning with urination x 2-3 days. Has tried OTC yeast infection medications without relief. Denies vaginal discharge, abdominal pain. Has mild lower back aching. No concern for STIs or pregnancy.Has had total hysterectomy. No other concerns.    HPI  Past Medical History:  Diagnosis Date  . Anxiety   . GERD (gastroesophageal reflux disease)   . Herpes genitalis in women   . History of kidney stones    h/o  . Low-lying placenta   . Vitamin D deficiency     Patient Active Problem List   Diagnosis Date Noted  . Vitamin D deficiency 12/18/2018  . Annual physical exam 06/18/2018  . Abnormal thyroid function test 04/08/2018  . Fatigue 02/04/2018  . Overweight (BMI 25.0-29.9) 02/04/2018  . Surgical menopause, symptomatic 05/21/2017  . 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 Endometriosis determined by laparoscopy 05/01/2017  . Dysmenorrhea 05/01/2017  . Dyspareunia, female 05/01/2017  . Abnormal uterine bleeding 05/01/2017    Past Surgical History:  Procedure Laterality  Date  . ABDOMINAL HYSTERECTOMY     total Dr. Enzo Bi 05/13/17 no h/o abnormal pap for endometriosis and right ovarian cyst  . DIAGNOSTIC LAPAROSCOPY    . ESOPHAGOGASTRODUODENOSCOPY (EGD) WITH PROPOFOL N/A 06/19/2016   Procedure: ESOPHAGOGASTRODUODENOSCOPY (EGD) WITH PROPOFOL;  Surgeon: Lucilla Lame, MD;  Location: [facility];  Service: Endoscopy;  Laterality: N/A;  . LAPAROSCOPIC VAGINAL HYSTERECTOMY WITH SALPINGO OOPHORECTOMY Bilateral 05/13/2017   Procedure: LAPAROSCOPIC ASSISTED VAGINAL HYSTERECTOMY WITH BILATERAL SALPINGO OOPHORECTOMY;  Surgeon: Brayton Mars, MD;  Location: [facility];  Service: Gynecology;  Laterality: Bilateral;  . LAPROSCOPY    . REMOVAL OF DRUG DELIVERY IMPLANT Left 05/13/2017   Procedure: REMOVAL OF DRUG DELIVERY IMPLANT, LEFT ARM;  Surgeon: Brayton Mars, MD;  Location: [facility];  Service: Gynecology;  Laterality: Left;  . TONSILLECTOMY    . TONSILLECTOMY      OB History    Gravida  2   Para  1   Term  1   Preterm      AB      Living  1     SAB      TAB      Ectopic      Multiple      Live Births  1            Home Medications    Prior to Admission medications   Medication Sig Start Date End Date Taking? Authorizing Provider  calcium-vitamin D (OSCAL WITH D) 500-200 MG-UNIT tablet Take 1 tablet by mouth.  Yes [provider]  Cholecalciferol (VITAMIN D-3) 5000 units TABS Take by mouth.   Yes [provider]  escitalopram (LEXAPRO) 10 MG tablet TAKE 1 TABLET BY MOUTH ONCE DAILY 03/23/19  Yes Shambley, Melody N, CNM  estradiol (VIVELLE-DOT) 0.1 MG/24HR patch PLACE 1 PATCH (0.1 MG TOTAL) ONTO THE SKIN 2 TIMES A WEEK. 06/26/18  Yes Shambley, Melody N, CNM  lansoprazole (PREVACID) 30 MG capsule TAKE 1 CAPSULE BY MOUTH AT BEDTIME 09/10/18  Yes Shambley, Melody N, CNM  phentermine (ADIPEX-P) 37.5 MG tablet Take 1 tablet (37.5 mg total) by mouth daily before breakfast. Rx 2/2 04/01/19  Yes McLean-Scocuzza,  Nino Glow, MD  amoxicillin-clavulanate (AUGMENTIN) 875-125 MG tablet Take 1 tablet by mouth 2 (two) times daily. 01/22/19   Jodelle Green, FNP  azithromycin (ZITHROMAX) 250 MG tablet Take 1 tablet (250 mg total) by mouth daily. Take first 2 tablets together, then 1 every day until finished. 04/26/19   Danton Clap, PA-C  fluconazole (DIFLUCAN) 150 MG tablet Take 1 tab every 72 hrs for symptoms of yeast infection 04/26/19 05/01/19  Laurene Footman B, PA-C  lansoprazole (PREVACID) 30 MG capsule Take 1 capsule (30 mg total) by mouth at bedtime. 09/10/18   Shambley, Melody N, CNM  predniSONE (DELTASONE) 20 MG tablet Take 2 tablets (40 mg total) by mouth daily for 5 days. 04/26/19 05/01/19  Danton Clap, PA-C  pseudoephedrine-codeine-guaifenesin (CHERATUSSIN DAC) 30-10-100 MG/5ML solution Take 10 mLs by mouth 3 (three) times daily as needed for up to 7 days for cough. 04/26/19 05/03/19  Danton Clap, PA-C    Family History Family History  Problem Relation Age of Onset  . Diabetes Maternal Grandmother   . Diabetes Maternal Grandfather   . Diabetes Mother        type 2  . Diabetes Sister        type 2   . Asthma Daughter   . Diabetes Maternal Aunt        type 2  . Asthma Daughter   . Diabetes Maternal Aunt        type 2    Social History Social History   Tobacco Use  . Smoking status: Never Smoker  . Smokeless tobacco: Never Used  Substance Use Topics  . Alcohol use: No  . Drug use: No     Allergies   Patient has no known allergies.   Review of Systems Review of Systems  Constitutional: Negative for appetite change, fatigue and fever.  HENT: Negative for rhinorrhea and sore throat.   Respiratory: Positive for cough and chest tightness. Negative for shortness of breath and wheezing.   Gastrointestinal: Negative for abdominal pain, diarrhea, nausea and vomiting.  Genitourinary: Positive for dysuria and frequency. Negative for decreased urine volume, flank pain, pelvic pain,  urgency, vaginal bleeding and vaginal discharge.  Musculoskeletal: Positive for back pain. Negative for myalgias.  Skin: Negative for rash.     Physical Exam Triage Vital Signs ED Triage Vitals  Enc Vitals Group     BP 04/26/19 0914 122/83     Pulse Rate 04/26/19 0914 94     Resp 04/26/19 0914 16     Temp 04/26/19 0914 98.2 F (36.8 C)     Temp Source 04/26/19 0914 Oral     SpO2 04/26/19 0914 100 %     Weight 04/26/19 0911 170 lb (77.1 kg)     Height 04/26/19 0911 5\' 6"  (1.676 m)     Head Circumference --  Peak Flow --      Pain Score 04/26/19 0911 0     Pain Loc --      Pain Edu? --      Excl. in [place]? --    No data found.  Updated Vital Signs BP 122/83 (BP Location: Left Arm)   Pulse 94   Temp 98.2 F (36.8 C) (Oral)   Resp 16   Ht 5\' 6"  (1.676 m)   Wt 170 lb (77.1 kg)   LMP  (LMP Unknown)   SpO2 100%   BMI 27.44 kg/m        Physical Exam Vitals signs and nursing note reviewed.  Constitutional:      General: She is not in acute distress.    Appearance: Normal appearance. She is normal weight.  HENT:     Head: Normocephalic and atraumatic.     Nose: Nose normal. No congestion or rhinorrhea.     Mouth/Throat:     Mouth: Mucous membranes are moist.     Pharynx: Oropharynx is clear. No posterior oropharyngeal erythema.  Eyes:     General: No scleral icterus.       Right eye: No discharge.        Left eye: No discharge.     Conjunctiva/sclera: Conjunctivae normal.  Cardiovascular:     Rate and Rhythm: Normal rate and regular rhythm.     Heart sounds: Normal heart sounds. No murmur.  Pulmonary:     Effort: Pulmonary effort is normal.     Breath sounds: Rhonchi (few scattered rhonchi throughout) present. No wheezing.  Abdominal:     Palpations: Abdomen is soft.     Tenderness: There is no abdominal tenderness.  Skin:    General: Skin is warm and dry.     Findings: No rash.  Neurological:     General: No focal deficit present.     Mental Status:  She is alert. Mental status is at baseline.     Motor: No weakness.     Gait: Gait normal.  Psychiatric:        Mood and Affect: Mood normal.        Behavior: Behavior normal.        Thought Content: Thought content normal.      UC Treatments / Results  Labs (all labs ordered are listed, but only abnormal results are displayed) Labs Reviewed  WET PREP, GENITAL - Abnormal; Notable for the following components:      Result Value   Yeast Wet Prep HPF POC PRESENT (*)    WBC, Wet Prep HPF POC FEW (*)    All other components within normal limits  URINALYSIS, COMPLETE (UACMP) WITH MICROSCOPIC - Abnormal; Notable for the following components:   Bacteria, UA FEW (*)    All other components within normal limits  URINE CULTURE    EKG   Radiology No results found.  Procedures Procedures (including critical care time)  Medications Ordered in UC Medications - No data to display  Initial Impression / Assessment and Plan / UC Course  I have reviewed the triage vital signs and the nursing notes.  Pertinent labs & imaging results that were available during my care of the patient were reviewed by me and considered in my medical decision making (see chart for details).  Clinical Course as of Apr 26 1647  Sun Apr 26, 2019  [id number] Bacteria, UA(!): FEW [LE]    Clinical Course User Index [LE] Danton Clap, PA-C  Final Clinical Impressions(s) / UC Diagnoses   Final diagnoses:  Acute bronchitis, unspecified organism  Cough  Yeast infection  Urinary incontinence, unspecified type     Discharge Instructions     BRONCHITIS: Take Mucinex D throughout the day and drink plenty of fluids to break up the mucus . Take cough syrup at bedtime if needed, for cough and to assist with sleep. Take Ibuprofen or other NSAID for relief of any pleuritic pain. Increase rest and fluid intake. Return to the clinic, your PCP, or ER if you have any new/ worsening symptoms such as fever,  chest  pain, difficulty breathing, worsening cough, mental status changes, lethargy, etc.        YEAST INFECTION: Begin diflucan. This is likely the cause of the dysuia, but will will send out a culture to make sure there is no urinary tract infection. There does not appear to be on the urinalysis. Follow up with your gynecologist about incontinence problems, but control of the cough should improve this.     ED Prescriptions    Medication Sig Dispense Auth. Provider   azithromycin (ZITHROMAX) 250 MG tablet Take 1 tablet (250 mg total) by mouth daily. Take first 2 tablets together, then 1 every day until finished. 6 tablet Laurene Footman B, PA-C   predniSONE (DELTASONE) 20 MG tablet Take 2 tablets (40 mg total) by mouth daily for 5 days. 10 tablet Laurene Footman B, PA-C   fluconazole (DIFLUCAN) 150 MG tablet Take 1 tab every 72 hrs for symptoms of yeast infection 2 tablet Danton Clap, PA-C   pseudoephedrine-codeine-guaifenesin (CHERATUSSIN DAC) 30-10-100 MG/5ML solution Take 10 mLs by mouth 3 (three) times daily as needed for up to 7 days for cough. 120 mL Danton Clap, PA-C     PDMP not reviewed this encounter.   Danton Clap, PA-C 04/26/19 1648

## 2019-04-27 LAB — URINE CULTURE: Culture: <10000 — AB

## 2019-05-01 MED FILL — DOTTI 0.1 MG/24HR PTTW: 0.1 | 28 days supply | Qty: 8 | Fill #8

## 2019-05-13 ENCOUNTER — Ambulatory Visit
Admission: RE | Admit: 2019-05-13 | Discharge: 2019-05-13 | Disposition: A | Discharge status: Home / Self Care | Payer: 59 | Source: Ambulatory Visit | Attending: Internal Medicine | Admitting: Internal Medicine

## 2019-05-13 ENCOUNTER — Ambulatory Visit (INDEPENDENT_AMBULATORY_CARE_PROVIDER_SITE_OTHER): Payer: 59 | Admitting: Internal Medicine

## 2019-05-13 ENCOUNTER — Encounter: Payer: Self-pay | Admitting: Internal Medicine

## 2019-05-13 ENCOUNTER — Other Ambulatory Visit: Payer: Self-pay

## 2019-05-13 ENCOUNTER — Ambulatory Visit
Admission: RE | Admit: 2019-05-13 | Discharge: 2019-05-13 | Disposition: A | Discharge status: Home / Self Care | Payer: 59 | Attending: Internal Medicine | Admitting: Internal Medicine

## 2019-05-13 VITALS — Ht 66.0 in | Wt 164.0 lb

## 2019-05-13 DIAGNOSIS — R062 Wheezing: Secondary | ICD-10-CM | POA: Diagnosis not present

## 2019-05-13 DIAGNOSIS — R059 Cough, unspecified: Secondary | ICD-10-CM

## 2019-05-13 DIAGNOSIS — H9209 Otalgia, unspecified ear: Secondary | ICD-10-CM | POA: Diagnosis not present

## 2019-05-13 DIAGNOSIS — J069 Acute upper respiratory infection, unspecified: Secondary | ICD-10-CM

## 2019-05-13 DIAGNOSIS — R05 Cough: Secondary | ICD-10-CM

## 2019-05-13 DIAGNOSIS — J40 Bronchitis, not specified as acute or chronic: Secondary | ICD-10-CM | POA: Diagnosis not present

## 2019-05-13 MED ORDER — ALBUTEROL SULFATE HFA 108 (90 BASE) MCG/ACT IN AERS: 1.0000 | INHALATION_SPRAY | RESPIRATORY_TRACT | 2 refills | Status: DC | PRN

## 2019-05-13 MED ORDER — LEVOFLOXACIN 750 MG PO TABS: 750.0000 mg | ORAL_TABLET | Freq: Every day | ORAL | 0 refills | Status: DC

## 2019-05-13 MED ORDER — BENZONATATE 200 MG PO CAPS: 200.0000 mg | ORAL_CAPSULE | Freq: Three times a day (TID) | ORAL | 0 refills | Status: DC | PRN

## 2019-05-13 MED ORDER — FLUCONAZOLE 150 MG PO TABS: 150.0000 mg | ORAL_TABLET | Freq: Once | ORAL | 0 refills | Status: AC

## 2019-05-13 NOTE — Progress Notes (Signed)
Virtual Visit via Video Note  I connected with Rebecca Evans  on 05/13/19 at 11:40 AM EST by a video enabled telemedicine application and verified that I am speaking with the correct person using two identifiers.  Location patient: work Environmental manager or home office Persons participating in the virtual visit: patient, provider  I discussed the limitations of evaluation and management by telemedicine and the availability of in person appointments. The patient expressed understanding and agreed to proceed.   HPI: C/o cough with phelgm at times and wheezing x 4 weeks no h/o asthma/allergies tried zpack, prednisone 04/26/19 x 5 days and no better 4 covid 19 tests negative gets weekly at work no fever, c/o right ear pain no ear popping ear just aches. telemed visit 05/12/2019 given singuliar and nose spray which she did not pick up. Denies sob.   She is not a smoker. She never tried Cheratussin due to c/w making her sleepy and she has small kids  ROS: See pertinent positives and negatives per HPI.  Past Medical History:  Diagnosis Date  . Anxiety   . GERD (gastroesophageal reflux disease)   . Herpes genitalis in women   . History of kidney stones    h/o  . Low-lying placenta   . Vitamin D deficiency     Past Surgical History:  Procedure Laterality Date  . ABDOMINAL HYSTERECTOMY     total Dr. Enzo Bi 05/13/17 no h/o abnormal pap for endometriosis and right ovarian cyst  . DIAGNOSTIC LAPAROSCOPY    . ESOPHAGOGASTRODUODENOSCOPY (EGD) WITH PROPOFOL N/A 06/19/2016   Procedure: ESOPHAGOGASTRODUODENOSCOPY (EGD) WITH PROPOFOL;  Surgeon: Lucilla Lame, MD;  Location: [facility];  Service: Endoscopy;  Laterality: N/A;  . LAPAROSCOPIC VAGINAL HYSTERECTOMY WITH SALPINGO OOPHORECTOMY Bilateral 05/13/2017   Procedure: LAPAROSCOPIC ASSISTED VAGINAL HYSTERECTOMY WITH BILATERAL SALPINGO OOPHORECTOMY;  Surgeon: Brayton Mars, MD;  Location: [facility];  Service: Gynecology;   Laterality: Bilateral;  . LAPROSCOPY    . REMOVAL OF DRUG DELIVERY IMPLANT Left 05/13/2017   Procedure: REMOVAL OF DRUG DELIVERY IMPLANT, LEFT ARM;  Surgeon: Brayton Mars, MD;  Location: [facility];  Service: Gynecology;  Laterality: Left;  . TONSILLECTOMY    . TONSILLECTOMY      Family History  Problem Relation Age of Onset  . Diabetes Maternal Grandmother   . Diabetes Maternal Grandfather   . Diabetes Mother        type 2  . Diabetes Sister        type 2   . Asthma Daughter   . Diabetes Maternal Aunt        type 2  . Asthma Daughter   . 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  calcium-vitamin D (OSCAL WITH D) 500-200 MG-UNIT tablet, Take 1 tablet by mouth., Disp: , Rfl:  .  Cholecalciferol (VITAMIN D-3) 5000 units TABS, Take by mouth., Disp: , Rfl:  .  escitalopram (LEXAPRO) 10 MG tablet, TAKE 1 TABLET BY MOUTH ONCE DAILY, Disp: 30 tablet, Rfl: 2 .  estradiol (VIVELLE-DOT) 0.1 MG/24HR patch, PLACE 1 PATCH (0.1 MG TOTAL) ONTO THE SKIN 2 TIMES A WEEK., Disp: 24 patch, Rfl: 4 .  lansoprazole (PREVACID) 30 MG capsule, TAKE 1 CAPSULE BY MOUTH AT BEDTIME, Disp: 30 capsule, Rfl: 2 .  phentermine (ADIPEX-P) 37.5 MG tablet,  Take 1 tablet (37.5 mg total) by mouth daily before breakfast. Rx 2/2, Disp: 60 tablet, Rfl: 0 .  albuterol (VENTOLIN HFA) 108 (90 Base) MCG/ACT inhaler, Inhale 1-2 puffs into the lungs every 4 (four) hours as needed for wheezing or shortness of breath., Disp: 18 g, Rfl: 2 .  benzonatate (TESSALON) 200 MG capsule, Take 1 capsule (200 mg total) by mouth 3 (three) times daily as needed for cough., Disp: 30 capsule, Rfl: 0 .  fluconazole (DIFLUCAN) 150 MG tablet, Take 1 tablet (150 mg total) by mouth once for 1 dose., Disp: 1 tablet, Rfl: 0 .  lansoprazole (PREVACID) 30 MG capsule, Take 1 capsule (30 mg total) by mouth at bedtime.,  Disp: 30 capsule, Rfl: 2 .  levofloxacin (LEVAQUIN) 750 MG tablet, Take 1 tablet (750 mg total) by mouth daily. X 5-7 days, Disp: 7 tablet, Rfl: 0  EXAM:  VITALS per patient if applicable:  GENERAL: alert, oriented, appears well and in no acute distress  HEENT: atraumatic, conjunttiva clear, no obvious abnormalities on inspection of external nose and ears  NECK: normal movements of the head and neck  LUNGS: on inspection no signs of respiratory distress, breathing rate appears normal, no obvious gross SOB, gasping or wheezing Barking cough on exam   CV: no obvious cyanosis  MS: moves all visible extremities without noticeable abnormality  PSYCH/NEURO: pleasant and cooperative, no obvious depression or anxiety, speech and thought processing grossly intact  ASSESSMENT AND PLAN:  Discussed the following assessment and plan:  Bronchitis ? Viral etiology RSV vs other 4 covid tests negative r/o pneumonia no h/o allergies per pt- Plan: benzonatate (TESSALON) 200 MG capsule, albuterol (VENTOLIN HFA) 108 (90 Base) MCG/ACT inhaler, fluconazole (DIFLUCAN) 150 MG tablet, levofloxacin (LEVAQUIN) 750 MG tablet CXR today   Otalgia, unspecified laterality rx levaquin could be infection vs ETD  Did not pick up nose spray  HM-annual due 06/19/19  Flu shot utd Tdap had 02/09/15 MMR and hep B immune   S/p total hysterectomy 05/23/17 no h/o abnormal pap was for endometriosis and right ovarian cyst rec f/u with Dr. Keturah Barre as sch 07/2018 and ask if pap still rec.   [place] [facility]  [facility]  [facility] Dr. Enzo Bi Est derm  D3 2000 to 5000 IU qd  -we discussed possible serious and likely etiologies, options for evaluation and workup, limitations of telemedicine visit vs in person visit, treatment, treatment risks and precautions. Pt prefers to treat via telemedicine empirically rather then risking or undertaking an in person visit at this moment. Patient agrees to seek prompt in  person care if worsening, new symptoms arise, or if is not improving with treatment.   I discussed the assessment and treatment plan with the patient. The patient was provided an opportunity to ask questions and all were answered. The patient agreed with the plan and demonstrated an understanding of the instructions.   The patient was advised to call back or seek an in-person evaluation if the symptoms worsen or if the condition fails to improve as anticipated.  Time spent 20 minutes  Delorise Jackson, MD

## 2019-06-12 DIAGNOSIS — Z23 Encounter for immunization: Secondary | ICD-10-CM | POA: Diagnosis not present

## 2019-06-18 ENCOUNTER — Other Ambulatory Visit: Payer: Self-pay

## 2019-06-23 ENCOUNTER — Ambulatory Visit (INDEPENDENT_AMBULATORY_CARE_PROVIDER_SITE_OTHER): Payer: 59 | Admitting: Internal Medicine

## 2019-06-23 ENCOUNTER — Encounter: Payer: Self-pay | Admitting: Internal Medicine

## 2019-06-23 ENCOUNTER — Other Ambulatory Visit: Payer: Self-pay

## 2019-06-23 VITALS — BP 110/78 | HR 82 | Temp 97.9°F | Ht 66.0 in | Wt 188.1 lb

## 2019-06-23 DIAGNOSIS — E669 Obesity, unspecified: Secondary | ICD-10-CM | POA: Diagnosis not present

## 2019-06-23 DIAGNOSIS — E785 Hyperlipidemia, unspecified: Secondary | ICD-10-CM | POA: Diagnosis not present

## 2019-06-23 DIAGNOSIS — Z780 Asymptomatic menopausal state: Secondary | ICD-10-CM | POA: Diagnosis not present

## 2019-06-23 DIAGNOSIS — F419 Anxiety disorder, unspecified: Secondary | ICD-10-CM

## 2019-06-23 DIAGNOSIS — K219 Gastro-esophageal reflux disease without esophagitis: Secondary | ICD-10-CM | POA: Diagnosis not present

## 2019-06-23 DIAGNOSIS — Z0000 Encounter for general adult medical examination without abnormal findings: Secondary | ICD-10-CM

## 2019-06-23 DIAGNOSIS — R5383 Other fatigue: Secondary | ICD-10-CM | POA: Diagnosis not present

## 2019-06-23 DIAGNOSIS — D72829 Elevated white blood cell count, unspecified: Secondary | ICD-10-CM

## 2019-06-23 DIAGNOSIS — E559 Vitamin D deficiency, unspecified: Secondary | ICD-10-CM

## 2019-06-23 MED ORDER — PHENTERMINE HCL 37.5 MG PO TABS: 37.5000 mg | ORAL_TABLET | Freq: Every day | ORAL | 0 refills | Status: DC

## 2019-06-23 MED ORDER — LANSOPRAZOLE 30 MG PO CPDR: 30.0000 mg | DELAYED_RELEASE_CAPSULE | Freq: Every day | ORAL | 3 refills | Status: DC

## 2019-06-23 MED ORDER — ESCITALOPRAM OXALATE 10 MG PO TABS: 10.0000 mg | ORAL_TABLET | Freq: Every day | ORAL | 3 refills | Status: DC

## 2019-06-23 MED ORDER — ESTRADIOL 0.1 MG/24HR TD PTTW: MEDICATED_PATCH | TRANSDERMAL | 0 refills | Status: DC

## 2019-06-23 NOTE — Progress Notes (Signed)
Chief Complaint  Patient presents with  . Annual Exam   Annual  1. C/o fatigue sleeping 7-8 hours at night  2 elevated wbc need to repeat cbc  3. C/o wheezing intermittently no h/o allergies CXR negative cough x 2 months with mucous which is just now getting better  4. Obesity wt increased off adipex since Thanksgiving when she was sick wants to resume   Review of Systems  Constitutional: Positive for malaise/fatigue. Negative for weight loss.  HENT: Negative for hearing loss.   Eyes: Negative for blurred vision.  Respiratory: Positive for wheezing. Negative for shortness of breath.   Cardiovascular: Negative for chest pain.  Gastrointestinal: Negative for blood in stool.  Musculoskeletal: Negative for joint pain.  Skin: Negative for rash.  Neurological: Negative for headaches.  Psychiatric/Behavioral: Negative for depression. The patient is not nervous/anxious and does not have insomnia.    Past Medical History:  Diagnosis Date  . Anxiety   . GERD (gastroesophageal reflux disease)   . Herpes genitalis in women   . History of kidney stones    h/o  . Low-lying placenta   . Vitamin D deficiency    Past Surgical History:  Procedure Laterality Date  . ABDOMINAL HYSTERECTOMY     total Dr. Enzo Bi 05/13/17 no h/o abnormal pap for endometriosis and right ovarian cyst  . DIAGNOSTIC LAPAROSCOPY    . ESOPHAGOGASTRODUODENOSCOPY (EGD) WITH PROPOFOL N/A 06/19/2016   Procedure: ESOPHAGOGASTRODUODENOSCOPY (EGD) WITH PROPOFOL;  Surgeon: Lucilla Lame, MD;  Location: [facility];  Service: Endoscopy;  Laterality: N/A;  . LAPAROSCOPIC VAGINAL HYSTERECTOMY WITH SALPINGO OOPHORECTOMY Bilateral 05/13/2017   Procedure: LAPAROSCOPIC ASSISTED VAGINAL HYSTERECTOMY WITH BILATERAL SALPINGO OOPHORECTOMY;  Surgeon: Brayton Mars, MD;  Location: [facility];  Service: Gynecology;  Laterality: Bilateral;  . LAPROSCOPY    . REMOVAL OF DRUG DELIVERY IMPLANT Left 05/13/2017   Procedure: REMOVAL OF  DRUG DELIVERY IMPLANT, LEFT ARM;  Surgeon: Brayton Mars, MD;  Location: [facility];  Service: Gynecology;  Laterality: Left;  . TONSILLECTOMY    . TONSILLECTOMY     Family History  Problem Relation Age of Onset  . Diabetes Maternal Grandmother   . Diabetes Maternal Grandfather   . Diabetes Mother        type 2  . Diabetes Sister        type 2   . Asthma Daughter   . Diabetes Maternal Aunt        type 2  . Asthma Daughter   . Diabetes Maternal Aunt        type 2   Social History   Socioeconomic History  . Marital status: Married    Spouse name: Not on file  . Number of children: Not on file  . Years of education: Not on file  . Highest education level: Not on file  Occupational History  . Not on file  Tobacco Use  . Smoking status: Never Smoker  . Smokeless tobacco: Never Used  Substance and Sexual Activity  . Alcohol use: No  . Drug use: No  . Sexual activity: Yes    Birth control/protection: None, Surgical  Other Topics Concern  . 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 Difficulty of Paying Living  Expenses: Not on file  Food Insecurity:   . Worried About Charity fundraiser in the Last Year: Not on file  . Ran Out of Food in the Last Year: Not on file  Transportation Needs:   . Lack of Transportation (Medical): Not on file  . Lack of Transportation (Non-Medical): Not on file  Physical Activity:   . Days of Exercise per Week: Not on file  . Minutes of Exercise per Session: Not on file  Stress:   . Feeling of Stress : Not on file  Social Connections:   . Frequency of Communication with Friends and Family: Not on file  . Frequency of Social Gatherings with Friends and Family: Not on file  . Attends Religious Services: Not on file  . Active Member of Clubs or Organizations: Not on file  . Attends  Archivist Meetings: Not on file  . Marital Status: Not on file  Intimate Partner Violence:   . Fear of Current or Ex-Partner: Not on file  . Emotionally Abused: Not on file  . Physically Abused: Not on file  . Sexually Abused: Not on file   Current Meds  Medication Sig  . Biotin 10000 MCG TABS Take by mouth daily.  . calcium-vitamin D (OSCAL WITH D) 500-200 MG-UNIT tablet Take 1 tablet by mouth.  . Cholecalciferol (VITAMIN D-3) 5000 units TABS Take by mouth.  . ELDERBERRY PO Take 1,250 mcg by mouth 2 (two) times daily.  Marland Kitchen escitalopram (LEXAPRO) 10 MG tablet TAKE 1 TABLET BY MOUTH ONCE DAILY  . estradiol (VIVELLE-DOT) 0.1 MG/24HR patch PLACE 1 PATCH (0.1 MG TOTAL) ONTO THE SKIN 2 TIMES A WEEK.  . lansoprazole (PREVACID) 30 MG capsule TAKE 1 CAPSULE BY MOUTH AT BEDTIME  . [DISCONTINUED] albuterol (VENTOLIN HFA) 108 (90 Base) MCG/ACT inhaler Inhale 1-2 puffs into the lungs every 4 (four) hours as needed for wheezing or shortness of breath.  . [DISCONTINUED] benzonatate (TESSALON) 200 MG capsule Take 1 capsule (200 mg total) by mouth 3 (three) times daily as needed for cough.  . [DISCONTINUED] levofloxacin (LEVAQUIN) 750 MG tablet Take 1 tablet (750 mg total) by mouth daily. X 5-7 days   No Known Allergies Recent Results (from the past 2160 hour(s))  Urinalysis, Complete w Microscopic     Status: Abnormal   Collection Time: 04/26/19  9:13 AM  Result Value Ref Range   Color, Urine YELLOW YELLOW   APPearance CLEAR CLEAR   Specific Gravity, Urine 1.020 1.005 - 1.030   pH 7.0 5.0 - 8.0   Glucose, UA NEGATIVE NEGATIVE mg/dL   Hgb urine dipstick NEGATIVE NEGATIVE   Bilirubin Urine NEGATIVE NEGATIVE   Ketones, ur NEGATIVE NEGATIVE mg/dL   Protein, ur NEGATIVE NEGATIVE mg/dL   Nitrite NEGATIVE NEGATIVE   Leukocytes,Ua NEGATIVE NEGATIVE   Squamous Epithelial / LPF 11-20 0 - 5   WBC, UA 0-5 0 - 5 WBC/hpf   RBC / HPF NONE SEEN 0 - 5 RBC/hpf   Bacteria, UA FEW (A) NONE SEEN     Comment: Performed at [facility], [address] [place], [place] [address]  Wet prep, genital     Status: Abnormal   Collection Time: 04/26/19  9:13 AM   Specimen: Vaginal  Result Value Ref Range   Yeast Wet Prep HPF POC PRESENT (A) NONE SEEN   Trich, Wet Prep NONE SEEN NONE SEEN   Clue Cells Wet Prep HPF POC NONE SEEN NONE SEEN   WBC, Wet Prep  HPF POC FEW (A) NONE SEEN   Sperm NONE SEEN     Comment: Performed at [facility], [address], [place], [place] [address]  Urine culture     Status: Abnormal   Collection Time: 04/26/19  9:13 AM   Specimen: Urine, Clean Catch  Result Value Ref Range   Specimen Description      URINE, CLEAN CATCH Performed at [facility], [address] [place], [place] [address]    Special Requests      NONE Performed at  [facility], [address], [place], [place] [address]    Culture (A)     <10,000 COLONIES/mL INSIGNIFICANT GROWTH Performed at [place] [address] [place], [place] [address]    Report Status 04/27/2019 FINAL    Objective  Body mass index is 30.36 kg/m. Wt Readings from Last 3 Encounters:  06/23/19 188 lb 1.6 oz (85.3 kg)  05/13/19 164 lb (74.4 kg)  04/26/19 170 lb (77.1 kg)   Temp Readings from Last 3 Encounters:  06/23/19 97.9 F (36.6 C) (Skin)  04/26/19 98.2 F (36.8 C) (Oral)  05/14/17 98.2 F (36.8 C) (Oral)   BP Readings from Last 3 Encounters:  06/23/19 110/78  04/26/19 122/83  06/26/18 119/79   Pulse Readings from Last 3 Encounters:  06/23/19 82  04/26/19 94  06/26/18 88    Physical Exam Vitals and nursing note reviewed.  Constitutional:      Appearance: Normal appearance. She is well-developed and well-groomed. She is obese.  HENT:     Head: Normocephalic and atraumatic.     Comments: +mask on      Mouth/Throat:     Mouth: Mucous membranes are moist.     Pharynx: Oropharynx is clear.  Eyes:     Conjunctiva/sclera:  Conjunctivae normal.     Pupils: Pupils are equal, round, and reactive to light.  Cardiovascular:     Rate and Rhythm: Normal rate and regular rhythm.     Heart sounds: Normal heart sounds. No murmur.  Pulmonary:     Effort: Pulmonary effort is normal.     Breath sounds: Normal breath sounds. No wheezing.  Abdominal:     General: Abdomen is flat. Bowel sounds are normal.     Tenderness: There is no abdominal tenderness.  Skin:    General: Skin is warm and dry.  Neurological:     General: No focal deficit present.     Mental Status: She is alert and oriented to person, place, and time. Mental status is at baseline.     Gait: Gait normal.  Psychiatric:        Attention and Perception: Attention and perception normal.        Mood and Affect: Mood and affect normal.        Speech: Speech normal.        Behavior: Behavior normal. Behavior is cooperative.        Thought Content: Thought content normal.        Cognition and Memory: Cognition and memory normal.        Judgment: Judgment normal.     Assessment  Plan  Annual physical exam Flu shot utd Tdap had 02/09/15 moderna shot had 07/10/19 1/2 MMR and hep B immune S/p total hysterectomy 05/23/17 no h/o abnormal pap was for endometriosis and right ovarian cyst rec f/u with Dr. Keturah Barre as sch 07/2018 and ask if pap still rec.  [place] [facility]  [facility]  [facility] Dr. Enzo Bi; pap 02/12/17  Est derm  D3 2000to 5000IU qd rec healthy diet and exercise  Hyperlipidemia, unspecified hyperlipidemia type - Plan: Lipid panel Given cholesterol info  Leukocytosis, unspecified type - Plan: CBC with Differential/Platelet  Fatigue, unspecified type - Plan: Comprehensive metabolic panel, CBC with Differential/Platelet, TSH  Vitamin D deficiency - Plan: VITAMIN D 25 Hydroxy (Vit-D Deficiency, Fractures),    Provider: Dr. Olivia Mackie McLean-Scocuzza-Internal Medicine

## 2019-06-23 NOTE — Patient Instructions (Signed)
Fatigue If you have fatigue, you feel tired all the time and have a lack of energy or a lack of motivation. Fatigue may make it difficult to start or complete tasks because of exhaustion. In general, occasional or mild fatigue is often a normal response to activity or life. However, long-lasting (chronic) or extreme fatigue may be a symptom of a medical condition. Follow these instructions at home: General instructions  Watch your fatigue for any changes.  Go to bed and get up at the same time every day.  Avoid fatigue by pacing yourself during the day and getting enough sleep at night.  Maintain a healthy weight. Medicines  Take over-the-counter and prescription medicines only as told by your health care provider.  Take a multivitamin, if told by your health care provider.  Do not use herbal or dietary supplements unless they are approved by your health care provider. Activity   Exercise regularly, as told by your health care provider.  Use or practice techniques to help you relax, such as yoga, tai chi, meditation, or massage therapy. Eating and drinking   Avoid heavy meals in the evening.  Eat a well-balanced diet, which includes lean proteins, whole grains, plenty of fruits and vegetables, and low-fat dairy products.  Avoid consuming too much caffeine.  Avoid the use of alcohol.  Drink enough fluid to keep your urine pale yellow. Lifestyle  Change situations that cause you stress. Try to keep your work and personal schedule in balance.  Do not use any products that contain nicotine or tobacco, such as cigarettes and e-cigarettes. If you need help quitting, ask your health care provider.  Do not use drugs. Contact a health care provider if:  Your fatigue does not get better.  You have a fever.  You suddenly lose or gain weight.  You have headaches.  You have trouble falling asleep or sleeping through the night.  You feel angry, guilty, anxious, or  sad.  You are unable to have a bowel movement (constipation).  Your skin is dry.  You have swelling in your legs or another part of your body. Get help right away if:  You feel confused.  Your vision is blurry.  You feel faint or you pass out.  You have a severe headache.  You have severe pain in your abdomen, your back, or the area between your waist and hips (pelvis).  You have chest pain, shortness of breath, or an irregular or fast heartbeat.  You are unable to urinate, or you urinate less than normal.  You have abnormal bleeding, such as bleeding from the rectum, vagina, nose, lungs, or nipples.  You vomit blood.  You have thoughts about hurting yourself or others. If you ever feel like you may hurt yourself or others, or have thoughts about taking your own life, get help right away. You can go to your nearest emergency department or call:  Your local emergency services (911 in the [place]  A suicide crisis helpline, such as the [facility] at 9164775271. This is open 24 hours a day. Summary  If you have fatigue, you feel tired all the time and have a lack of energy or a lack of motivation.  Fatigue may make it difficult to start or complete tasks because of exhaustion.  Long-lasting (chronic) or extreme fatigue may be a symptom of a medical condition.  Exercise regularly, as told by your health care provider.  Change situations that cause you stress. Try to keep your  work and personal schedule in balance. This information is not intended to replace advice given to you by your health care provider. Make sure you discuss any questions you have with your health care provider. Document Revised: 12/10/2018 Document Reviewed: 02/13/2017 Elsevier Patient Education  [place].  Budget-Friendly Healthy Eating There are many ways to save money at the grocery store and continue to eat healthy. You can be successful if you:  Plan meals  according to your budget.  Make a grocery list and only purchase food according to your grocery list.  Prepare food yourself. What are tips for following this plan?  Reading food labels  Compare food labels between brand name foods and the store brand. Often the nutritional value is the same, but the store brand is lower cost.  Look for products that do not have added sugar, fat, or salt (sodium). These often cost the same but are healthier for you. Products may be labeled as: ? Sugar-free. ? Nonfat. ? Low-fat. ? Sodium-free. ? Low-sodium.  Look for lean ground beef labeled as at least 92% lean and 8% fat. Shopping  Buy only the items on your grocery list and go only to the areas of the store that have the items on your list.  Use coupons only for foods and brands you normally buy. Avoid buying items you wouldn't normally buy simply because they are on sale.  Check online and in newspapers for weekly deals.  Buy healthy items from the bulk bins when available, such as herbs, spices, flour, pasta, nuts, and dried fruit.  Buy fruits and vegetables that are in season. Prices are usually lower on in-season produce.  Look at the unit price on the price tag. Use it to compare different brands and sizes to find out which item is the best deal.  Choose healthy items that are often low-cost, such as carrots, potatoes, apples, bananas, and oranges. Dried or canned beans are a low-cost protein source.  Buy in bulk and freeze extra food. Items you can buy in bulk include meats, fish, poultry, frozen fruits, and frozen vegetables.  Avoid buying "ready-to-eat" foods, such as pre-cut fruits and vegetables and pre-made salads.  If possible, shop around to discover where you can find the best prices. Consider other retailers such as dollar stores, larger [employer], local fruit and vegetable stands, and farmers markets.  Do not shop when you are hungry. If you shop while hungry, it may  be hard to stick to your list and budget.  Resist impulse buying. Use your grocery list as your official plan for the week.  Buy a variety of vegetables and fruits by purchasing fresh, frozen, and canned items.  Look at the top and bottom shelves for deals. Foods at eye level (eye level of an adult or child) are usually more expensive.  Be efficient with your time when shopping. The more time you spend at the store, the more money you are likely to spend.  To save money when choosing more expensive foods like meats and dairy: ? Choose cheaper cuts of meat, such as bone-in chicken thighs and drumsticks instead of skinless and boneless chicken. When you are ready to prepare the chicken, you can remove the skin yourself to make it healthier. ? Choose lean meats like chicken or [place] instead of beef. ? Choose canned seafood, such as tuna, salmon, or sardines. ? Buy eggs as a low-cost source of protein. ? Buy dried beans and peas, such as lentils,  split peas, or kidney beans instead of meats. Dried beans and peas are a good alternative source of protein. ? Buy the larger tubs of yogurt instead of individual-sized containers.  Choose water instead of sodas and other sweetened beverages.  Avoid buying chips, cookies, and other "junk food." These items are usually expensive and not healthy. Cooking  Make extra food and freeze the extras in meal-sized containers or in individual portions for fast meals and snacks.  Pre-cook on days when you have extra time to prepare meals in advance. You can keep these meals in the fridge or freezer and reheat for a quick meal.  When you come home from the grocery store, wash, peel, and cut fruits and vegetables so they are ready to use and eat. This will help reduce food waste. Meal planning  Do not eat out or get fast food. Prepare food at home.  Make a grocery list and make sure to bring it with you to the store. If you have a smart phone, you could use  your phone to create your shopping list.  Plan meals and snacks according to a grocery list and budget you create.  Use leftovers in your meal plan for the week.  Look for recipes where you can cook once and make enough food for two meals.  Include budget-friendly meals like stews, casseroles, and stir-fry dishes.  Try some meatless meals or try "no cook" meals like salads.  Make sure that half your plate is filled with fruits or vegetables. Choose from fresh, frozen, or canned fruits and vegetables. If eating canned, remember to rinse them before eating. This will remove any excess salt added for packaging. Summary  Eating healthy on a budget is possible if you plan your meals according to your budget, purchase according to your budget and grocery list, and prepare food yourself.  Tips for buying more food on a limited budget include buying generic brands, using coupons only for foods you normally buy, and buying healthy items from the bulk bins when available.  Tips for buying cheaper food to replace expensive food include choosing cheaper, lean cuts of meat, and buying dried beans and peas. This information is not intended to replace advice given to you by your health care provider. Make sure you discuss any questions you have with your health care provider. Document Revised: 05/22/2017 Document Reviewed: 05/22/2017 Elsevier Patient Education  [place].  High Cholesterol  High cholesterol is a condition in which the blood has high levels of a white, waxy, fat-like substance (cholesterol). The human body needs small amounts of cholesterol. The liver makes all the cholesterol that the body needs. Extra (excess) cholesterol comes from the food that we eat. Cholesterol is carried from the liver by the blood through the blood vessels. If you have high cholesterol, deposits (plaques) may build up on the walls of your blood vessels (arteries). Plaques make the arteries narrower and  stiffer. Cholesterol plaques increase your risk for heart attack and stroke. Work with your health care provider to keep your cholesterol levels in a healthy range. What increases the risk? This condition is more likely to develop in people who:  Eat foods that are high in animal fat (saturated fat) or cholesterol.  Are overweight.  Are not getting enough exercise.  Have a family history of high cholesterol. What are the signs or symptoms? There are no symptoms of this condition. How is this diagnosed? This condition may be diagnosed from the results of  a blood test.  If you are older than age 4, your health care provider may check your cholesterol every 4-6 years.  You may be checked more often if you already have high cholesterol or other risk factors for heart disease. The blood test for cholesterol measures:  "Bad" cholesterol (LDL cholesterol). This is the main type of cholesterol that causes heart disease. The desired level for LDL is less than 100.  "Good" cholesterol (HDL cholesterol). This type helps to protect against heart disease by cleaning the arteries and carrying the LDL away. The desired level for HDL is 60 or higher.  Triglycerides. These are fats that the body can store or burn for energy. The desired number for triglycerides is lower than 150.  Total cholesterol. This is a measure of the total amount of cholesterol in your blood, including LDL cholesterol, HDL cholesterol, and triglycerides. A healthy number is less than 200. How is this treated? This condition is treated with diet changes, lifestyle changes, and medicines. Diet changes  This may include eating more whole grains, fruits, vegetables, nuts, and fish.  This may also include cutting back on red meat and foods that have a lot of added sugar. Lifestyle changes  Changes may include getting at least 40 minutes of aerobic exercise 3 times a week. Aerobic exercises include walking, biking, and  swimming. Aerobic exercise along with a healthy diet can help you maintain a healthy weight.  Changes may also include quitting smoking. Medicines  Medicines are usually given if diet and lifestyle changes have failed to reduce your cholesterol to healthy levels.  Your health care provider may prescribe a statin medicine. Statin medicines have been shown to reduce cholesterol, which can reduce the risk of heart disease. Follow these instructions at home: Eating and drinking If told by your health care provider:  Eat chicken (without skin), fish, veal, shellfish, ground [place] breast, and round or loin cuts of red meat.  Do not eat fried foods or fatty meats, such as hot dogs and salami.  Eat plenty of fruits, such as apples.  Eat plenty of vegetables, such as broccoli, potatoes, and carrots.  Eat beans, peas, and lentils.  Eat grains such as barley, rice, couscous, and bulgur wheat.  Eat pasta without cream sauces.  Use skim or nonfat milk, and eat low-fat or nonfat yogurt and cheeses.  Do not eat or drink whole milk, cream, ice cream, egg yolks, or hard cheeses.  Do not eat stick margarine or tub margarines that contain trans fats (also called partially hydrogenated oils).  Do not eat saturated tropical oils, such as coconut oil and palm oil.  Do not eat cakes, cookies, crackers, or other baked goods that contain trans fats.  General instructions  Exercise as directed by your health care provider. Increase your activity level with activities such as gardening, walking, and taking the stairs.  Take over-the-counter and prescription medicines only as told by your health care provider.  Do not use any products that contain nicotine or tobacco, such as cigarettes and e-cigarettes. If you need help quitting, ask your health care provider.  Keep all follow-up visits as told by your health care provider. This is important. Contact a health care provider if:  You are  struggling to maintain a healthy diet or weight.  You need help to start on an exercise program.  You need help to stop smoking. Get help right away if:  You have chest pain.  You have trouble breathing. This information  is not intended to replace advice given to you by your health care provider. Make sure you discuss any questions you have with your health care provider. Document Revised: 05/24/2017 Document Reviewed: 11/19/2015 Elsevier Patient Education  [place].  Cholesterol Content in Foods Cholesterol is a waxy, fat-like substance that helps to carry fat in the blood. The body needs cholesterol in small amounts, but too much cholesterol can cause damage to the arteries and heart. Most people should eat less than 200 milligrams (mg) of cholesterol a day. Foods with cholesterol  Cholesterol is found in animal-based foods, such as meat, seafood, and dairy. Generally, low-fat dairy and lean meats have less cholesterol than full-fat dairy and fatty meats. The milligrams of cholesterol per serving (mg per serving) of common cholesterol-containing foods are listed below. Meat and other proteins  Egg -- one large whole egg has 186 mg.  Veal shank -- 4 oz has 141 mg.  Lean ground [place] (93% lean) -- 4 oz has 118 mg.  Fat-trimmed lamb loin -- 4 oz has 106 mg.  Lean ground beef (90% lean) -- 4 oz has 100 mg.  Lobster -- 3.5 oz has 90 mg.  Pork loin chops -- 4 oz has 86 mg.  Canned salmon -- 3.5 oz has 83 mg.  Fat-trimmed beef top loin -- 4 oz has 78 mg.  Frankfurter -- 1 frank (3.5 oz) has 77 mg.  Crab -- 3.5 oz has 71 mg.  Roasted chicken without skin, white meat -- 4 oz has 66 mg.  Light bologna -- 2 oz has 45 mg.  Deli-cut [place] -- 2 oz has 31 mg.  Canned tuna -- 3.5 oz has 31 mg.  Berniece Salines -- 1 oz has 29 mg.  Oysters and mussels (raw) -- 3.5 oz has 25 mg.  Mackerel -- 1 oz has 22 mg.  Trout -- 1 oz has 20 mg.  Pork sausage -- 1 link (1 oz) has 17  mg.  Salmon -- 1 oz has 16 mg.  Tilapia -- 1 oz has 14 mg. Dairy  Soft-serve ice cream --  cup (4 oz) has 103 mg.  Whole-milk yogurt -- 1 cup (8 oz) has 29 mg.  Cheddar cheese -- 1 oz has 28 mg.  American cheese -- 1 oz has 28 mg.  Whole milk -- 1 cup (8 oz) has 23 mg.  2% milk -- 1 cup (8 oz) has 18 mg.  Cream cheese -- 1 tablespoon (Tbsp) has 15 mg.  Cottage cheese --  cup (4 oz) has 14 mg.  Low-fat (1%) milk -- 1 cup (8 oz) has 10 mg.  Sour cream -- 1 Tbsp has 8.5 mg.  Low-fat yogurt -- 1 cup (8 oz) has 8 mg.  Nonfat Greek yogurt -- 1 cup (8 oz) has 7 mg.  Half-and-half cream -- 1 Tbsp has 5 mg. Fats and oils  Cod liver oil -- 1 tablespoon (Tbsp) has 82 mg.  Butter -- 1 Tbsp has 15 mg.  Lard -- 1 Tbsp has 14 mg.  Bacon grease -- 1 Tbsp has 14 mg.  Mayonnaise -- 1 Tbsp has 5-10 mg.  Margarine -- 1 Tbsp has 3-10 mg. Exact amounts of cholesterol in these foods may vary depending on specific ingredients and brands. Foods without cholesterol Most plant-based foods do not have cholesterol unless you combine them with a food that has cholesterol. Foods without cholesterol include:  Grains and cereals.  Vegetables.  Fruits.  Vegetable oils, such as olive,  canola, and sunflower oil.  Legumes, such as peas, beans, and lentils.  Nuts and seeds.  Egg whites. Summary  The body needs cholesterol in small amounts, but too much cholesterol can cause damage to the arteries and heart.  Most people should eat less than 200 milligrams (mg) of cholesterol a day. This information is not intended to replace advice given to you by your health care provider. Make sure you discuss any questions you have with your health care provider. Document Revised: 05/03/2017 Document Reviewed: 01/15/2017 Elsevier Patient Education  [place].

## 2019-06-24 DIAGNOSIS — E785 Hyperlipidemia, unspecified: Secondary | ICD-10-CM | POA: Diagnosis not present

## 2019-06-24 DIAGNOSIS — E559 Vitamin D deficiency, unspecified: Secondary | ICD-10-CM | POA: Diagnosis not present

## 2019-06-24 DIAGNOSIS — D72829 Elevated white blood cell count, unspecified: Secondary | ICD-10-CM | POA: Diagnosis not present

## 2019-06-24 DIAGNOSIS — R5383 Other fatigue: Secondary | ICD-10-CM | POA: Diagnosis not present

## 2019-06-25 LAB — COMPREHENSIVE METABOLIC PANEL
ALT: 22 IU/L (ref 0–32)
AST: 17 IU/L (ref 0–40)
Albumin/Globulin Ratio: 1.4 (ref 1.2–2.2)
Albumin: 4.2 g/dL (ref 3.8–4.8)
Alkaline Phosphatase: 131 IU/L — ABNORMAL HIGH (ref 39–117)
BUN/Creatinine Ratio: 12 (ref 9–23)
BUN: 7 mg/dL (ref 6–20)
Bilirubin Total: 0.4 mg/dL (ref 0.0–1.2)
CO2: 25 mmol/L (ref 20–29)
Calcium: 9.2 mg/dL (ref 8.7–10.2)
Chloride: 103 mmol/L (ref 96–106)
Creatinine, Ser: 0.6 mg/dL (ref 0.57–1.00)
GFR calc Af Amer: 139 mL/min/{1.73_m2} (ref >59)
GFR calc non Af Amer: 121 mL/min/{1.73_m2} (ref >59)
Globulin, Total: 3 g/dL (ref 1.5–4.5)
Glucose: 90 mg/dL (ref 65–99)
Potassium: 4.1 mmol/L (ref 3.5–5.2)
Sodium: 140 mmol/L (ref 134–144)
Total Protein: 7.2 g/dL (ref 6.0–8.5)

## 2019-06-25 LAB — CBC WITH DIFFERENTIAL/PLATELET
Basophils Absolute: 0.1 10*3/uL (ref 0.0–0.2)
Basos: 1 %
EOS (ABSOLUTE): 0.3 10*3/uL (ref 0.0–0.4)
Eos: 3 %
Hematocrit: 41.1 % (ref 34.0–46.6)
Hemoglobin: 14 g/dL (ref 11.1–15.9)
Immature Grans (Abs): 0 10*3/uL (ref 0.0–0.1)
Immature Granulocytes: 0 %
Lymphocytes Absolute: 3.9 10*3/uL — ABNORMAL HIGH (ref 0.7–3.1)
Lymphs: 43 %
MCH: 29.9 pg (ref 26.6–33.0)
MCHC: 34.1 g/dL (ref 31.5–35.7)
MCV: 88 fL (ref 79–97)
Monocytes Absolute: 0.4 10*3/uL (ref 0.1–0.9)
Monocytes: 4 %
Neutrophils Absolute: 4.5 10*3/uL (ref 1.4–7.0)
Neutrophils: 49 %
Platelets: 362 10*3/uL (ref 150–450)
RBC: 4.68 x10E6/uL (ref 3.77–5.28)
RDW: 13.1 % (ref 11.7–15.4)
WBC: 9.1 10*3/uL (ref 3.4–10.8)

## 2019-06-25 LAB — LIPID PANEL
Chol/HDL Ratio: 5.4 ratio — ABNORMAL HIGH (ref 0.0–4.4)
Cholesterol, Total: 231 mg/dL — ABNORMAL HIGH (ref 100–199)
HDL: 43 mg/dL (ref >39)
LDL Chol Calc (NIH): 156 mg/dL — ABNORMAL HIGH (ref 0–99)
Triglycerides: 176 mg/dL — ABNORMAL HIGH (ref 0–149)
VLDL Cholesterol Cal: 32 mg/dL (ref 5–40)

## 2019-06-25 LAB — VITAMIN D 25 HYDROXY (VIT D DEFICIENCY, FRACTURES): Vit D, 25-Hydroxy: 41.9 ng/mL (ref 30.0–100.0)

## 2019-06-25 LAB — TSH: TSH: 0.838 u[IU]/mL (ref 0.450–4.500)

## 2019-07-08 ENCOUNTER — Telehealth: Payer: Self-pay | Admitting: Internal Medicine

## 2019-07-08 NOTE — Telephone Encounter (Signed)
I called pt and left vm to call ofc to sch fasting labs ASAP.

## 2019-07-10 DIAGNOSIS — Z23 Encounter for immunization: Secondary | ICD-10-CM | POA: Diagnosis not present

## 2019-08-12 ENCOUNTER — Telehealth: Payer: Self-pay | Admitting: Obstetrics and Gynecology

## 2019-08-12 DIAGNOSIS — Z780 Asymptomatic menopausal state: Secondary | ICD-10-CM

## 2019-08-12 NOTE — Telephone Encounter (Signed)
Patient needs her estrogen patch prescription refilled she will run out before her upcoming appt. Patient has appointment with Marcelline Mates for physical in April 8th.

## 2019-08-13 MED ORDER — ESTRADIOL 0.1 MG/24HR TD PTTW: MEDICATED_PATCH | TRANSDERMAL | 0 refills | Status: DC

## 2019-08-13 NOTE — Telephone Encounter (Signed)
Pt called no answer LM via VM that her medication had been refilled and sent to [facility].

## 2019-08-17 NOTE — Telephone Encounter (Signed)
Pt called no answer LM via VM to call the office to speak more about her call to the office.

## 2019-09-10 ENCOUNTER — Encounter: Payer: Self-pay | Admitting: Obstetrics and Gynecology

## 2019-09-10 ENCOUNTER — Ambulatory Visit (INDEPENDENT_AMBULATORY_CARE_PROVIDER_SITE_OTHER): Payer: 59 | Admitting: Obstetrics and Gynecology

## 2019-09-10 ENCOUNTER — Other Ambulatory Visit: Payer: Self-pay

## 2019-09-10 ENCOUNTER — Other Ambulatory Visit: Payer: Self-pay | Admitting: Obstetrics and Gynecology

## 2019-09-10 VITALS — BP 102/71 | HR 71 | Ht 66.0 in | Wt 187.2 lb

## 2019-09-10 DIAGNOSIS — E669 Obesity, unspecified: Secondary | ICD-10-CM | POA: Diagnosis not present

## 2019-09-10 DIAGNOSIS — Z01419 Encounter for gynecological examination (general) (routine) without abnormal findings: Secondary | ICD-10-CM | POA: Diagnosis not present

## 2019-09-10 DIAGNOSIS — E8940 Asymptomatic postprocedural ovarian failure: Secondary | ICD-10-CM | POA: Diagnosis not present

## 2019-09-10 DIAGNOSIS — Z90710 Acquired absence of both cervix and uterus: Secondary | ICD-10-CM | POA: Diagnosis not present

## 2019-09-10 DIAGNOSIS — Z79890 Hormone replacement therapy (postmenopausal): Secondary | ICD-10-CM | POA: Diagnosis not present

## 2019-09-10 DIAGNOSIS — E785 Hyperlipidemia, unspecified: Secondary | ICD-10-CM | POA: Diagnosis not present

## 2019-09-10 DIAGNOSIS — E66811 Obesity, class 1: Secondary | ICD-10-CM

## 2019-09-10 MED ORDER — ESTRADIOL 0.1 MG/24HR TD PTTW: MEDICATED_PATCH | TRANSDERMAL | 11 refills | Status: DC

## 2019-09-10 NOTE — Progress Notes (Signed)
GYNECOLOGY ANNUAL PHYSICAL EXAM PROGRESS NOTE  Subjective:    Rebecca Evans is a 34 y.o. G40P1001 female who presents for an annual exam. She was previously a patient of [place], CNM. She has a previous history of endometriosis, s/p LAVH with BSO in 2018. The patient has no complaints today. The patient is sexually active.The patient wears seatbelts: yes. The patient participates in regular exercise: no. Has the patient ever been transfused or tattooed?: no.    Gynecologic History  Menarche age:56 or 13 No LMP recorded (lmp unknown). Patient has had a hysterectomy. Contraception: status post hysterectomy History of STI's: Denies Last Pap: 03/2017. Results were: normal.  Denies h/o abnormal pap smears.    OB History  Gravida Para Term Preterm AB Living  2 1 1  0 0 1  SAB TAB Ectopic Multiple Live Births  0 0 0 0 1    # Outcome Date GA Lbr Len/2nd Weight Sex Delivery Anes PTL Lv  2 Term 05/07/13    F Vag-Spont   LIV  1 Gravida             Past Medical History:  Diagnosis Date  . Anxiety   . GERD (gastroesophageal reflux disease)   . Herpes genitalis in women   . History of kidney stones    h/o  . Low-lying placenta   . Vitamin D deficiency     Past Surgical History:  Procedure Laterality Date  . ABDOMINAL HYSTERECTOMY     total Dr. Enzo Bi 05/13/17 no h/o abnormal pap for endometriosis and right ovarian cyst  . DIAGNOSTIC LAPAROSCOPY    . ESOPHAGOGASTRODUODENOSCOPY (EGD) WITH PROPOFOL N/A 06/19/2016   Procedure: ESOPHAGOGASTRODUODENOSCOPY (EGD) WITH PROPOFOL;  Surgeon: Lucilla Lame, MD;  Location: [facility];  Service: Endoscopy;  Laterality: N/A;  . LAPAROSCOPIC VAGINAL HYSTERECTOMY WITH SALPINGO OOPHORECTOMY Bilateral 05/13/2017   Procedure: LAPAROSCOPIC ASSISTED VAGINAL HYSTERECTOMY WITH BILATERAL SALPINGO OOPHORECTOMY;  Surgeon: Brayton Mars, MD;  Location: [facility];  Service: Gynecology;  Laterality: Bilateral;  . LAPROSCOPY    .  REMOVAL OF DRUG DELIVERY IMPLANT Left 05/13/2017   Procedure: REMOVAL OF DRUG DELIVERY IMPLANT, LEFT ARM;  Surgeon: Brayton Mars, MD;  Location: [facility];  Service: Gynecology;  Laterality: Left;  . TONSILLECTOMY    . TONSILLECTOMY      Family History  Problem Relation Age of Onset  . Diabetes Maternal Grandmother   . Diabetes Maternal Grandfather   . Diabetes Mother        type 2  . Diabetes Sister        type 2   . Asthma Daughter   . Diabetes Maternal Aunt        type 2  . Asthma Daughter   . Diabetes Maternal Aunt        type 2    Social History   Socioeconomic History  . Marital status: Married    Spouse name: Not on file  . Number of children: Not on file  . Years of education: Not on file  . Highest education level: Not on file  Occupational History  . Not on file  Tobacco Use  . Smoking status: Never Smoker  . Smokeless tobacco: Never Used  Substance and Sexual Activity  . Alcohol use: No  . Drug use: No  . Sexual activity: Yes    Birth control/protection: None, Surgical  Other Topics Concern  . 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 Difficulty of Paying Living Expenses:   Food Insecurity:   . Worried About Charity fundraiser in the Last Year:   . Arboriculturist in the Last Year:   Transportation Needs:   . Film/video editor (Medical):   Marland Kitchen Lack of Transportation (Non-Medical):   Physical Activity:   . Days of Exercise per Week:   . Minutes of Exercise per Session:   Stress:   . Feeling of Stress :   Social Connections:   . Frequency of Communication with Friends and Family:   . Frequency of Social Gatherings with Friends and Family:   . Attends Religious Services:   . Active Member of Clubs or Organizations:   . Attends Archivist Meetings:   Marland Kitchen Marital Status:    Intimate Partner Violence:   . Fear of Current or Ex-Partner:   . Emotionally Abused:   Marland Kitchen Physically Abused:   . Sexually Abused:     Current Outpatient Medications on File Prior to Visit  Medication Sig Dispense Refill  . Biotin 10000 MCG TABS Take by mouth daily.    . calcium-vitamin D (OSCAL WITH D) 500-200 MG-UNIT tablet Take 1 tablet by mouth.    . Cholecalciferol (VITAMIN D-3) 5000 units TABS Take by mouth.    . escitalopram (LEXAPRO) 10 MG tablet Take 1 tablet (10 mg total) by mouth daily. 90 tablet 3  . estradiol (VIVELLE-DOT) 0.1 MG/24HR patch PLACE 1 PATCH (0.1 MG TOTAL) ONTO THE SKIN 2 TIMES A WEEK. Further refills Dr. Marcelline Mates 8 patch 0  . lansoprazole (PREVACID) 30 MG capsule Take 1 capsule (30 mg total) by mouth at bedtime. 30 minutes before food 90 capsule 3  . ELDERBERRY PO Take 1,250 mcg by mouth 2 (two) times daily.    . phentermine (ADIPEX-P) 37.5 MG tablet Take 1 tablet (37.5 mg total) by mouth daily before breakfast. 1/2 (Patient not taking: Reported on 09/10/2019) 60 tablet 0   No current facility-administered medications on file prior to visit.    No Known Allergies    Review of Systems Constitutional: negative for chills, fatigue, fevers and sweats Eyes: negative for irritation, redness and visual disturbance Ears, nose, mouth, throat, and face: negative for hearing loss, nasal congestion, snoring and tinnitus Respiratory: negative for asthma, cough, sputum Cardiovascular: negative for chest pain, dyspnea, exertional chest pressure/discomfort, irregular heart beat, palpitations and syncope Gastrointestinal: negative for abdominal pain, change in bowel habits, nausea and vomiting Genitourinary: negative for abnormal menstrual periods, genital lesions, sexual problems and vaginal discharge, dysuria and urinary incontinence Integument/breast: negative for breast lump, breast tenderness and nipple discharge Hematologic/lymphatic: negative for bleeding and easy  bruising Musculoskeletal:negative for back pain and muscle weakness Neurological: negative for dizziness, headaches, vertigo and weakness Endocrine: negative for diabetic symptoms including polydipsia, polyuria and skin dryness Allergic/Immunologic: negative for hay fever and urticaria        Objective:  Blood pressure 102/71, pulse 71, height 5\' 6"  (1.676 m), weight 187 lb 3.2 oz (84.9 kg). Body mass index is 30.21 kg/m.  General Appearance:    Alert, cooperative, no distress, appears stated age, mild obesity  Head:    Normocephalic, without obvious abnormality, atraumatic  Eyes:    PERRL, conjunctiva/corneas clear, EOM's intact, both eyes  Ears:  Normal external ear canals, both ears  Nose:   Nares normal, septum midline, mucosa normal, no drainage or sinus tenderness  Throat:   Lips, mucosa, and tongue normal; teeth and gums normal  Neck:   Supple, symmetrical, trachea midline, no adenopathy; thyroid: no enlargement/tenderness/nodules; no carotid bruit or JVD  Back:     Symmetric, no curvature, ROM normal, no CVA tenderness  Lungs:     Clear to auscultation bilaterally, respirations unlabored  Chest Wall:    No tenderness or deformity   Heart:    Regular rate and rhythm, S1 and S2 normal, no murmur, rub or gallop  Breast Exam:    No tenderness, masses, or nipple abnormality  Abdomen:     Soft, non-tender, bowel sounds active all four quadrants, no masses, no organomegaly.    Genitalia:    Pelvic:external genitalia normal, vagina without lesions, discharge, or tenderness, rectovaginal septum  normal. Cervix normal in appearance, no cervical motion tenderness, no adnexal masses or tenderness.  Uterus normal size, shape, mobile, regular contours, nontender.  Rectal:    Normal external sphincter.  No hemorrhoids appreciated. Internal exam not done.   Extremities:   Extremities normal, atraumatic, no cyanosis or edema  Pulses:   2+ and symmetric all extremities  Skin:   Skin color,  texture, turgor normal, no rashes or lesions  Lymph nodes:   Cervical, supraclavicular, and axillary nodes normal  Neurologic:   CNII-XII intact, normal strength, sensation and reflexes throughout    Labs:  Lab Results  Component Value Date   WBC 9.1 06/24/2019   HGB 14.0 06/24/2019   HCT 41.1 06/24/2019   MCV 88 06/24/2019   PLT 362 06/24/2019    Lab Results  Component Value Date   CREATININE 0.60 06/24/2019   BUN 7 06/24/2019   NA 140 06/24/2019   K 4.1 06/24/2019   CL 103 06/24/2019   CO2 25 06/24/2019    Lab Results  Component Value Date   ALT 22 06/24/2019   AST 17 06/24/2019   ALKPHOS 131 (H) 06/24/2019   BILITOT 0.4 06/24/2019    Lab Results  Component Value Date   TSH 0.838 06/24/2019    Lab Results  Component Value Date   CHOL 231 (H) 06/24/2019   HDL 43 06/24/2019   LDLCALC 156 (H) 06/24/2019   TRIG 176 (H) 06/24/2019   CHOLHDL 5.4 (H) 06/24/2019     Assessment:   1. Encounter for well woman exam with routine gynecological exam   2. Surgical menopause on hormone replacement therapy   3. S/P LAVHBSO   4. Obesity (BMI 30.0-34.9)   5. Hyperlipidemia, unspecified hyperlipidemia type    Plan:     Blood tests: performed by PCP, reviewed. Breast self exam technique reviewed and patient encouraged to perform self-exam monthly. Discussed healthy lifestyle modifications. Pap smear no longer needed, is s/p hysterectomy.   Refill given on HRT for surgical menopause.  Hyperlipidemia, to be managed by PCP.  Follow up in 1 year for annual exam.    Rubie Maid, MD [facility]

## 2019-09-10 NOTE — Progress Notes (Signed)
Pt present for annual exam. Pt stated that she was doing well no problems. Last pap 02/12/17.

## 2019-09-10 NOTE — Patient Instructions (Addendum)
Preventive Care 21-33 Years Old, Female Preventive care refers to visits with your health care provider and lifestyle choices that can promote health and wellness. This includes:  A yearly physical exam. This may also be called an annual well check.  Regular dental visits and eye exams.  Immunizations.  Screening for certain conditions.  Healthy lifestyle choices, such as eating a healthy diet, getting regular exercise, not using drugs or products that contain nicotine and tobacco, and limiting alcohol use. What can I expect for my preventive care visit? Physical exam Your health care provider will check your:  Height and weight. This may be used to calculate body mass index (BMI), which tells if you are at a healthy weight.  Heart rate and blood pressure.  Skin for abnormal spots. Counseling Your health care provider may ask you questions about your:  Alcohol, tobacco, and drug use.  Emotional well-being.  Home and relationship well-being.  Sexual activity.  Eating habits.  Work and work environment.  Method of birth control.  Menstrual cycle.  Pregnancy history. What immunizations do I need?  Influenza (flu) vaccine  This is recommended every year. Tetanus, diphtheria, and pertussis (Tdap) vaccine  You may need a Td booster every 10 years. Varicella (chickenpox) vaccine  You may need this if you have not been vaccinated. Human papillomavirus (HPV) vaccine  If recommended by your health care provider, you may need three doses over 6 months. Measles, mumps, and rubella (MMR) vaccine  You may need at least one dose of MMR. You may also need a second dose. Meningococcal conjugate (MenACWY) vaccine  One dose is recommended if you are age 19-21 years and a first-year college student living in a residence hall, or if you have one of several medical conditions. You may also need additional booster doses. Pneumococcal conjugate (PCV13) vaccine  You may need  this if you have certain conditions and were not previously vaccinated. Pneumococcal polysaccharide (PPSV23) vaccine  You may need one or two doses if you smoke cigarettes or if you have certain conditions. Hepatitis A vaccine  You may need this if you have certain conditions or if you travel or work in places where you may be exposed to hepatitis A. Hepatitis B vaccine  You may need this if you have certain conditions or if you travel or work in places where you may be exposed to hepatitis B. Haemophilus influenzae type b (Hib) vaccine  You may need this if you have certain conditions. You may receive vaccines as individual doses or as more than one vaccine together in one shot (combination vaccines). Talk with your health care provider about the risks and benefits of combination vaccines. What tests do I need?  Blood tests  Lipid and cholesterol levels. These may be checked every 5 years starting at age 20.  Hepatitis C test.  Hepatitis B test. Screening  Diabetes screening. This is done by checking your blood sugar (glucose) after you have not eaten for a while (fasting).  Sexually transmitted disease (STD) testing.  BRCA-related cancer screening. This may be done if you have a family history of breast, ovarian, tubal, or peritoneal cancers.  Pelvic exam and Pap test. This may be done every 3 years starting at age 21. Starting at age 30, this may be done every 5 years if you have a Pap test in combination with an HPV test. Talk with your health care provider about your test results, treatment options, and if necessary, the need for more tests.   Follow these instructions at home: Eating and drinking   Eat a diet that includes fresh fruits and vegetables, whole grains, lean protein, and low-fat dairy.  Take vitamin and mineral supplements as recommended by your health care provider.  Do not drink alcohol if: ? Your health care provider tells you not to drink. ? You are  pregnant, may be pregnant, or are planning to become pregnant.  If you drink alcohol: ? Limit how much you have to 0-1 drink a day. ? Be aware of how much alcohol is in your drink. In the [place] one drink equals one 12 oz bottle of beer (355 mL), one 5 oz glass of wine (148 mL), or one 1 oz glass of hard liquor (44 mL). Lifestyle  Take daily care of your teeth and gums.  Stay active. Exercise for at least 30 minutes on 5 or more days each week.  Do not use any products that contain nicotine or tobacco, such as cigarettes, e-cigarettes, and chewing tobacco. If you need help quitting, ask your health care provider.  If you are sexually active, practice safe sex. Use a condom or other form of birth control (contraception) in order to prevent pregnancy and STIs (sexually transmitted infections). If you plan to become pregnant, see your health care provider for a preconception visit. What's next?  Visit your health care provider once a year for a well check visit.  Ask your health care provider how often you should have your eyes and teeth checked.  Stay up to date on all vaccines. This information is not intended to replace advice given to you by your health care provider. Make sure you discuss any questions you have with your health care provider. Document Revised: 01/30/2018 Document Reviewed: 01/30/2018 Elsevier Patient Education  2020 Elsevier Inc. Breast Self-Awareness Breast self-awareness is knowing how your breasts look and feel. Doing breast self-awareness is important. It allows you to catch a breast problem early while it is still small and can be treated. All women should do breast self-awareness, including women who have had breast implants. Tell your doctor if you notice a change in your breasts. What you need:  A mirror.  A well-lit room. How to do a breast self-exam A breast self-exam is one way to learn what is normal for your breasts and to check for changes. To do a  breast self-exam: Look for changes  1. Take off all the clothes above your waist. 2. Stand in front of a mirror in a room with good lighting. 3. Put your hands on your hips. 4. Push your hands down. 5. Look at your breasts and nipples in the mirror to see if one breast or nipple looks different from the other. Check to see if: ? The shape of one breast is different. ? The size of one breast is different. ? There are wrinkles, dips, and bumps in one breast and not the other. 6. Look at each breast for changes in the skin, such as: ? Redness. ? Scaly areas. 7. Look for changes in your nipples, such as: ? Liquid around the nipples. ? Bleeding. ? Dimpling. ? Redness. ? A change in where the nipples are. Feel for changes  1. Lie on your back on the floor. 2. Feel each breast. To do this, follow these steps: ? Pick a breast to feel. ? Put the arm closest to that breast above your head. ? Use your other arm to feel the nipple area of your breast. Feel   breast. Feel the area with the pads of your three middle fingers by making small circles with your fingers. For the first circle, press lightly. For the second circle, press harder. For the third circle, press even harder. ? Keep making circles with your fingers at the different pressures as you move down your breast. Stop when you feel your ribs. ? Move your fingers a little toward the center of your body. ? Start making circles with your fingers again, this time going up until you reach your collarbone. ? Keep making up-and-down circles until you reach your armpit. Remember to keep using the three pressures. ? Feel the other breast in the same way. 3. Sit or stand in the tub or shower. 4. With soapy water on your skin, feel each breast the same way you did in step 2 when you were lying on the floor. Write down what you find Writing down what you find can help you remember what to tell your doctor. Write down:  What is normal for each breast.  Any  changes you find in each breast, including: ? The kind of changes you find. ? Whether you have pain. ? Size and location of any lumps.  When you last had your menstrual period. General tips  Check your breasts every month.  If you are breastfeeding, the best time to check your breasts is after you feed your baby or after you use a breast pump.  If you get menstrual periods, the best time to check your breasts is 5-7 days after your menstrual period is over.  With time, you will become comfortable with the self-exam, and you will begin to know if there are changes in your breasts. Contact a doctor if you:  See a change in the shape or size of your breasts or nipples.  See a change in the skin of your breast or nipples, such as red or scaly skin.  Have fluid coming from your nipples that is not normal.  Find a lump or thick area that was not there before.  Have pain in your breasts.  Have any concerns about your breast health. Summary  Breast self-awareness includes looking for changes in your breasts, as well as feeling for changes within your breasts.  Breast self-awareness should be done in front of a mirror in a well-lit room.  You should check your breasts every month. If you get menstrual periods, the best time to check your breasts is 5-7 days after your menstrual period is over.  Let your doctor know of any changes you see in your breasts, including changes in size, changes on the skin, pain or tenderness, or fluid from your nipples that is not normal. This information is not intended to replace advice given to you by your health care provider. Make sure you discuss any questions you have with your health care provider. Document Revised: 01/07/2018 Document Reviewed: 01/07/2018 Elsevier Patient Education  [employer].   Menopause and Hormone Replacement Therapy Menopause is a normal time of life when menstrual periods stop completely and the ovaries stop  producing the female hormones estrogen and progesterone. This lack of hormones can affect your health and cause undesirable symptoms. Hormone replacement therapy (HRT) can relieve some of those symptoms. What is hormone replacement therapy? HRT is the use of artificial (synthetic) hormones to replace hormones that your body has stopped producing because you have reached menopause. What are my options for HRT?  HRT may consist of the synthetic hormones  estrogen and progestin, or it may consist of only estrogen (estrogen-only therapy). You and your health care provider will decide which form of HRT is best for you. If you choose to be on HRT and you have a uterus, estrogen and progestin are usually prescribed. Estrogen-only therapy is used for women who do not have a uterus. Possible options for taking HRT include:  Pills.  Patches.  Gels.  Sprays.  Vaginal cream.  Vaginal rings.  Vaginal inserts. The amount of hormone(s) that you take and how long you take the hormone(s) varies according to your health. It is important to:  Begin HRT with the lowest possible dosage.  Stop HRT as soon as your health care provider tells you to stop.  Work with your health care provider so that you feel informed and comfortable with your decisions. What are the benefits of HRT? HRT can reduce the frequency and severity of menopausal symptoms. Benefits of HRT vary according to the kind of symptoms that you have, how severe they are, and your overall health. HRT may help to improve the following symptoms of menopause:  Hot flashes and night sweats. These are sudden feelings of heat that spread over the face and body. The skin may turn red, like a blush. Night sweats are hot flashes that happen while you are sleeping or trying to sleep.  Bone loss (osteoporosis). The body loses calcium more quickly after menopause, causing the bones to become weaker. This can increase the risk for bone breaks  (fractures).  Vaginal dryness. The lining of the vagina can become thin and dry, which can cause pain during sex or cause infection, burning, or itching.  Urinary tract infections.  Urinary incontinence. This is the inability to control when you pass urine.  Irritability.  Short-term memory problems. What are the risks of HRT? Risks of HRT vary depending on your individual health and medical history. Risks of HRT also depend on whether you receive both estrogen and progestin or you receive estrogen only. HRT may increase the risk of:  Spotting. This is when a small amount of blood leaks from the vagina unexpectedly.  Endometrial cancer. This cancer is in the lining of the uterus (endometrium).  Breast cancer.  Increased density of breast tissue. This can make it harder to find breast cancer on a breast X-ray (mammogram).  Stroke.  Heart disease.  Blood clots.  Gallbladder disease.  Liver disease. Risks of HRT can increase if you have any of the following conditions:  Endometrial cancer.  Liver disease.  Heart disease.  Breast cancer.  History of blood clots.  History of stroke. Follow these instructions at home:  Take over-the-counter and prescription medicines only as told by your health care provider.  Get mammograms, pelvic exams, and medical checkups as often as told by your health care provider.  Have Pap tests done as often as told by your health care provider. A Pap test is sometimes called a Pap smear. It is a screening test that is used to check for signs of cancer of the cervix and vagina. A Pap test can also identify the presence of infection or precancerous changes. Pap tests may be done: ? Every 3 years, starting at age 36. ? Every 5 years, starting after age 32, in combination with testing for human papillomavirus (HPV). ? More often or less often depending on other medical conditions you have, your age, and other risk factors.  It is up to you to  get the results of  your Pap test. Ask your health care provider, or the department that is doing the test, when your results will be ready.  Keep all follow-up visits as told by your health care provider. This is important. Contact a health care provider if you have:  Pain or swelling in your legs.  Shortness of breath.  Chest pain.  Lumps or changes in your breasts or armpits.  Slurred speech.  Pain, burning, or bleeding when you urinate.  Unusual vaginal bleeding.  Dizziness or headaches.  Weakness or numbness in any part of your arms or legs.  Pain in your abdomen. Summary  Menopause is a normal time of life when menstrual periods stop completely and the ovaries stop producing the female hormones estrogen and progesterone.  Hormone replacement therapy (HRT) can relieve some of the symptoms of menopause.  HRT can reduce the frequency and severity of menopausal symptoms.  Risks of HRT vary depending on your individual health and medical history. This information is not intended to replace advice given to you by your health care provider. Make sure you discuss any questions you have with your health care provider. Document Revised: 01/21/2018 Document Reviewed: 01/21/2018 Elsevier Patient Education  2020 [employer].

## 2019-09-13 ENCOUNTER — Emergency Department
Admission: EM | Admit: 2019-09-13 | Discharge: 2019-09-13 | Disposition: A | Discharge status: Home / Self Care | Payer: 59 | Attending: Emergency Medicine | Admitting: Emergency Medicine

## 2019-09-13 ENCOUNTER — Emergency Department: Payer: 59

## 2019-09-13 ENCOUNTER — Other Ambulatory Visit: Payer: Self-pay

## 2019-09-13 DIAGNOSIS — Y92003 Bedroom of unspecified non-institutional (private) residence as the place of occurrence of the external cause: Secondary | ICD-10-CM | POA: Diagnosis not present

## 2019-09-13 DIAGNOSIS — Z79899 Other long term (current) drug therapy: Secondary | ICD-10-CM | POA: Insufficient documentation

## 2019-09-13 DIAGNOSIS — M79672 Pain in left foot: Secondary | ICD-10-CM | POA: Diagnosis not present

## 2019-09-13 DIAGNOSIS — W109XXA Fall (on) (from) unspecified stairs and steps, initial encounter: Secondary | ICD-10-CM | POA: Insufficient documentation

## 2019-09-13 DIAGNOSIS — Y939 Activity, unspecified: Secondary | ICD-10-CM | POA: Insufficient documentation

## 2019-09-13 DIAGNOSIS — S93602A Unspecified sprain of left foot, initial encounter: Secondary | ICD-10-CM | POA: Diagnosis not present

## 2019-09-13 DIAGNOSIS — S99922A Unspecified injury of left foot, initial encounter: Secondary | ICD-10-CM | POA: Diagnosis present

## 2019-09-13 DIAGNOSIS — Y999 Unspecified external cause status: Secondary | ICD-10-CM | POA: Insufficient documentation

## 2019-09-13 DIAGNOSIS — R52 Pain, unspecified: Secondary | ICD-10-CM

## 2019-09-13 MED ORDER — HYDROCODONE-ACETAMINOPHEN 5-325 MG PO TABS: 1.0000 | ORAL_TABLET | Freq: Four times a day (QID) | ORAL | 0 refills | Status: DC | PRN

## 2019-09-13 MED ORDER — HYDROCODONE-ACETAMINOPHEN 5-325 MG PO TABS
1.0000 | ORAL_TABLET | Freq: Once | ORAL | Status: AC
  Administered 2019-09-13: 13:00:00 1 via ORAL
  Filled 2019-09-13: qty 1

## 2019-09-13 NOTE — ED Notes (Signed)
Signature pad not working, pt verbalizes ok/understanding of discharge instructions. Denies further questions/concerns at this time

## 2019-09-13 NOTE — ED Notes (Addendum)
See triage note, pt states she hurt her left foot while going down the bunk bed stairs last night. Pt able to move toes. Pt reports pain to top of foot. Reports unable to bear weight.  Denies hitting head with fall.

## 2019-09-13 NOTE — ED Provider Notes (Signed)
[facility] Emergency Department Provider Note  ____________________________________________   First MD Initiated Contact with Patient 09/13/19 1221     (approximate)  I have reviewed the triage vital signs and the nursing notes.   HISTORY  Chief Complaint Foot Injury   HPI Rebecca Evans is a 34 y.o. female presents to the ED with complaint of left foot pain.  Patient states that she slipped on the steps of her children's bunk bed.  She injured both feet however the left foot has continued to have pain especially on the dorsal aspect.  Patient is able to move digits without any difficulty.  She took a Tylenol PM last evening which did not help any at all.  Today she is unable to bear weight.  She rates her pain as 6 out of 10.      Past Medical History:  Diagnosis Date  . Anxiety   . GERD (gastroesophageal reflux disease)   . Herpes genitalis in women   . History of kidney stones    h/o  . Low-lying placenta   . Vitamin D deficiency     Patient Active Problem List   Diagnosis Date Noted  . Hyperlipidemia 06/23/2019  . Anxiety   . Vitamin D deficiency 12/18/2018  . Annual physical exam 06/18/2018  . Abnormal thyroid function test 04/08/2018  . Fatigue 02/04/2018  . Overweight (BMI 25.0-29.9) 02/04/2018  . Surgical menopause, symptomatic 05/21/2017  . 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 Endometriosis determined by laparoscopy 05/01/2017  . Dysmenorrhea 05/01/2017  . Dyspareunia, female 05/01/2017  . Abnormal uterine bleeding 05/01/2017    Past Surgical History:  Procedure Laterality Date  . ABDOMINAL HYSTERECTOMY     total Dr. Enzo Bi 05/13/17 no h/o abnormal pap for endometriosis and right ovarian cyst  . DIAGNOSTIC LAPAROSCOPY    . ESOPHAGOGASTRODUODENOSCOPY (EGD) WITH PROPOFOL N/A 06/19/2016   Procedure: ESOPHAGOGASTRODUODENOSCOPY (EGD) WITH PROPOFOL;  Surgeon: Lucilla Lame, MD;  Location: [facility] END[facility];  Service: Endoscopy;   Laterality: N/A;  . LAPAROSCOPIC VAGINAL HYSTERECTOMY WITH SALPINGO OOPHORECTOMY Bilateral 05/13/2017   Procedure: LAPAROSCOPIC ASSISTED VAGINAL HYSTERECTOMY WITH BILATERAL SALPINGO OOPHORECTOMY;  Surgeon: Brayton Mars, MD;  Location: [facility] ORS;  Service: Gynecology;  Laterality: Bilateral;  . LAPROSCOPY    . REMOVAL OF DRUG DELIVERY IMPLANT Left 05/13/2017   Procedure: REMOVAL OF DRUG DELIVERY IMPLANT, LEFT ARM;  Surgeon: Brayton Mars, MD;  Location: [facility] ORS;  Service: Gynecology;  Laterality: Left;  . TONSILLECTOMY    . TONSILLECTOMY      Prior to Admission medications   Medication Sig Start Date End Date Taking? Authorizing Provider  Biotin 10000 MCG TABS Take by mouth daily.    [provider]  calcium-vitamin D (OSCAL WITH D) 500-200 MG-UNIT tablet Take 1 tablet by mouth.    [provider]  Cholecalciferol (VITAMIN D-3) 5000 units TABS Take by mouth.    [provider]  ELDERBERRY PO Take 1,250 mcg by mouth 2 (two) times daily.    [provider]  escitalopram (LEXAPRO) 10 MG tablet Take 1 tablet (10 mg total) by mouth daily. 06/23/19   McLean-Scocuzza, Nino Glow, MD  estradiol (VIVELLE-DOT) 0.1 MG/24HR patch PLACE 1 PATCH (0.1 MG TOTAL) ONTO THE SKIN 2 TIMES A WEEK. Further refills Dr. Marcelline Mates 09/10/19   Rubie Maid, MD  HYDROcodone-acetaminophen (NORCO/VICODIN) 5-325 MG tablet Take 1 tablet by mouth every 6 (six) hours as needed for moderate pain. 09/13/19   Johnn Hai, PA-C  lansoprazole (PREVACID) 30  MG capsule Take 1 capsule (30 mg total) by mouth at bedtime. 30 minutes before food 06/23/19   McLean-Scocuzza, Nino Glow, MD  phentermine (ADIPEX-P) 37.5 MG tablet Take 1 tablet (37.5 mg total) by mouth daily before breakfast. 1/2 Patient not taking: Reported on 09/10/2019 06/23/19   McLean-Scocuzza, Nino Glow, MD    Allergies Patient has no known allergies.  Family History  Problem Relation Age of Onset  . Diabetes Maternal  Grandmother   . Diabetes Maternal Grandfather   . Diabetes Mother        type 2  . Diabetes Sister        type 2   . Asthma Daughter   . Diabetes Maternal Aunt        type 2  . Asthma Daughter   . Diabetes Maternal Aunt        type 2    Social History Social History   Tobacco Use  . Smoking status: Never Smoker  . Smokeless tobacco: Never Used  Substance Use Topics  . Alcohol use: No  . Drug use: No    Review of Systems Constitutional: No fever/chills Eyes: No visual changes. ENT: No injury Cardiovascular: Denies chest pain. Respiratory: Denies shortness of breath. Gastrointestinal: No abdominal pain.  No nausea, no vomiting.  Musculoskeletal: Positive for bilateral foot pain with left foot greater than the right. Skin: Negative for rash. Neurological: Negative for headaches, focal weakness or numbness. ___________________________________________   PHYSICAL EXAM:  VITAL SIGNS: ED Triage Vitals  Enc Vitals Group     BP 09/13/19 1048 (!) 142/76     Pulse Rate 09/13/19 1048 99     Resp 09/13/19 1048 16     Temp 09/13/19 1048 98.2 F (36.8 C)     Temp Source 09/13/19 1045 Oral     SpO2 09/13/19 1048 100 %     Weight 09/13/19 1046 180 lb (81.6 kg)     Height 09/13/19 1046 5\' 6"  (1.676 m)     Head Circumference --      Peak Flow --      Pain Score 09/13/19 1046 6     Pain Loc --      Pain Edu? --      Excl. in [place]? --    Constitutional: Alert and oriented. Well appearing and in no acute distress. Eyes: Conjunctivae are normal.  Head: Atraumatic. Neck: No stridor.   Cardiovascular: Normal rate, regular rhythm. Grossly normal heart sounds.  Good peripheral circulation. Respiratory: Normal respiratory effort.  No retractions. Lungs CTAB. Musculoskeletal: No gross deformities noted of the right foot in comparison with the left.  The left dorsal aspect is markedly tender with soft tissue edema present.  There is evidence of a developing ecchymotic area on the  dorsal aspect.  Patient is able to move digits slowly.  Motor sensory function intact.  Skin is intact.  Nontender proximal tib-fib and knee. Neurologic:  Normal speech and language. No gross focal neurologic deficits are appreciated. No gait instability. Skin:  Skin is warm, dry and intact. Psychiatric: Mood and affect are normal. Speech and behavior are normal.  ____________________________________________   LABS (all labs ordered are listed, but only abnormal results are displayed)  Labs Reviewed - No data to display  RADIOLOGY  Official radiology report(s): DG Foot Complete Left  Result Date: 09/13/2019 CLINICAL DATA:  Left foot pain, difficulty weight-bearing EXAM: LEFT FOOT - COMPLETE 3+ VIEW COMPARISON:  None. FINDINGS: There is no evidence of fracture or dislocation.  There is no evidence of arthropathy or other focal bone abnormality. Soft tissues are unremarkable. IMPRESSION: Negative. Electronically Signed   By: Davina Poke D.O.   On: 09/13/2019 11:52    ____________________________________________   PROCEDURES  Procedure(s) performed (including Critical Care):  Procedures Jones wrap and crutches was applied by ED tech.   ____________________________________________   INITIAL IMPRESSION / ASSESSMENT AND PLAN / ED COURSE  As part of my medical decision making, I reviewed the following data within the electronic MEDICAL RECORD NUMBER Notes from prior ED visits and [place] Controlled Substance Database  34 year old female presents to the ED with complaint of left foot pain after she slipped going down the steps of a bunk bed last evening.  Patient is unable to bear weight.  She took Tylenol PM last evening without any relief.  No gross deformity was noted however there is marked tenderness on palpation of the dorsal aspect of the left foot distal metatarsals.  Skin is intact.  X-rays were negative for acute bony injury.  Patient was given crutches and a Jones wrap was applied  to provide protection and support.  She is instructed to ice and elevate as needed for pain.  A prescription for Norco was sent to her pharmacy.  She is to follow-up with Dr. Mack Guise if any continued problems with her foot. ____________________________________________   FINAL CLINICAL IMPRESSION(S) / ED DIAGNOSES  Final diagnoses:  Pain  Sprain of left foot, initial encounter     ED Discharge Orders         Ordered    HYDROcodone-acetaminophen (NORCO/VICODIN) 5-325 MG tablet  Every 6 hours PRN     09/13/19 1300           Note:  This document was prepared using Dragon voice recognition software and may include unintentional dictation errors.    Johnn Hai, PA-C 09/13/19 1306    Harvest Dark, MD 09/13/19 1450

## 2019-09-13 NOTE — Discharge Instructions (Signed)
Follow-up with Dr. Mack Guise if not improving.  Today's x-rays did not show a fracture.  Ice and elevate frequently to help control swelling and reduce some of the pain.  A prescription for hydrocodone was sent to your pharmacy to take for pain.  Be aware that this medication could cause drowsiness and increase your risk for falling.  Use crutches when you are up walking.  Rule of thumb is do not try to walk on your foot until you are able to stand without the crutches and not have any pain.  You may also take ibuprofen with this medication if additional pain medication is needed.

## 2019-09-13 NOTE — ED Triage Notes (Signed)
Pt states she slipped on the steps last night and her left foot folded under and is having pain since.

## 2019-09-14 DIAGNOSIS — S93602A Unspecified sprain of left foot, initial encounter: Secondary | ICD-10-CM | POA: Diagnosis not present

## 2019-09-21 DIAGNOSIS — S93602A Unspecified sprain of left foot, initial encounter: Secondary | ICD-10-CM | POA: Diagnosis not present

## 2019-10-07 DIAGNOSIS — S92245A Nondisplaced fracture of medial cuneiform of left foot, initial encounter for closed fracture: Secondary | ICD-10-CM | POA: Diagnosis not present

## 2019-10-07 DIAGNOSIS — S8264XA Nondisplaced fracture of lateral malleolus of right fibula, initial encounter for closed fracture: Secondary | ICD-10-CM | POA: Diagnosis not present

## 2019-10-23 ENCOUNTER — Ambulatory Visit: Payer: 59 | Admitting: Internal Medicine

## 2019-11-04 DIAGNOSIS — S92245A Nondisplaced fracture of medial cuneiform of left foot, initial encounter for closed fracture: Secondary | ICD-10-CM | POA: Diagnosis not present

## 2019-11-25 DIAGNOSIS — S92245A Nondisplaced fracture of medial cuneiform of left foot, initial encounter for closed fracture: Secondary | ICD-10-CM | POA: Diagnosis not present

## 2020-02-25 ENCOUNTER — Encounter: Payer: Self-pay | Admitting: Internal Medicine

## 2020-02-29 ENCOUNTER — Other Ambulatory Visit: Payer: Self-pay | Admitting: Internal Medicine

## 2020-02-29 DIAGNOSIS — E669 Obesity, unspecified: Secondary | ICD-10-CM

## 2020-02-29 MED ORDER — PHENTERMINE HCL 37.5 MG PO TABS: 37.5000 mg | ORAL_TABLET | Freq: Every day | ORAL | 0 refills | Status: DC

## 2020-04-26 ENCOUNTER — Encounter: Payer: Self-pay | Admitting: Internal Medicine

## 2020-07-01 ENCOUNTER — Encounter: Payer: 59 | Admitting: Internal Medicine

## 2020-07-04 ENCOUNTER — Other Ambulatory Visit: Payer: Self-pay | Admitting: Internal Medicine

## 2020-07-04 DIAGNOSIS — F419 Anxiety disorder, unspecified: Secondary | ICD-10-CM

## 2020-07-13 ENCOUNTER — Encounter: Payer: Self-pay | Admitting: Internal Medicine

## 2020-07-13 ENCOUNTER — Ambulatory Visit (INDEPENDENT_AMBULATORY_CARE_PROVIDER_SITE_OTHER): Payer: 59 | Admitting: Internal Medicine

## 2020-07-13 ENCOUNTER — Other Ambulatory Visit: Payer: Self-pay | Admitting: Internal Medicine

## 2020-07-13 ENCOUNTER — Other Ambulatory Visit: Payer: Self-pay

## 2020-07-13 VITALS — BP 122/84 | HR 79 | Temp 97.8°F | Ht 64.72 in | Wt 200.6 lb

## 2020-07-13 DIAGNOSIS — Z1329 Encounter for screening for other suspected endocrine disorder: Secondary | ICD-10-CM

## 2020-07-13 DIAGNOSIS — K219 Gastro-esophageal reflux disease without esophagitis: Secondary | ICD-10-CM | POA: Diagnosis not present

## 2020-07-13 DIAGNOSIS — Z0000 Encounter for general adult medical examination without abnormal findings: Secondary | ICD-10-CM

## 2020-07-13 DIAGNOSIS — Z1389 Encounter for screening for other disorder: Secondary | ICD-10-CM

## 2020-07-13 DIAGNOSIS — R5383 Other fatigue: Secondary | ICD-10-CM

## 2020-07-13 DIAGNOSIS — R7303 Prediabetes: Secondary | ICD-10-CM | POA: Insufficient documentation

## 2020-07-13 DIAGNOSIS — D72829 Elevated white blood cell count, unspecified: Secondary | ICD-10-CM

## 2020-07-13 DIAGNOSIS — E559 Vitamin D deficiency, unspecified: Secondary | ICD-10-CM

## 2020-07-13 DIAGNOSIS — D72820 Lymphocytosis (symptomatic): Secondary | ICD-10-CM

## 2020-07-13 DIAGNOSIS — R739 Hyperglycemia, unspecified: Secondary | ICD-10-CM

## 2020-07-13 LAB — CBC WITH DIFFERENTIAL/PLATELET
Basophils Absolute: 0.1 10*3/uL (ref 0.0–0.1)
Basophils Relative: 0.8 % (ref 0.0–3.0)
Eosinophils Absolute: 0.3 10*3/uL (ref 0.0–0.7)
Eosinophils Relative: 2.3 % (ref 0.0–5.0)
HCT: 39.2 % (ref 36.0–46.0)
Hemoglobin: 13.2 g/dL (ref 12.0–15.0)
Lymphocytes Relative: 33.5 % (ref 12.0–46.0)
Lymphs Abs: 3.7 10*3/uL (ref 0.7–4.0)
MCHC: 33.6 g/dL (ref 30.0–36.0)
MCV: 87.7 fl (ref 78.0–100.0)
Monocytes Absolute: 0.4 10*3/uL (ref 0.1–1.0)
Monocytes Relative: 3.5 % (ref 3.0–12.0)
Neutro Abs: 6.7 10*3/uL (ref 1.4–7.7)
Neutrophils Relative %: 59.9 % (ref 43.0–77.0)
Platelets: 331 10*3/uL (ref 150.0–400.0)
RBC: 4.47 Mil/uL (ref 3.87–5.11)
RDW: 13.9 % (ref 11.5–15.5)
WBC: 11.1 10*3/uL — ABNORMAL HIGH (ref 4.0–10.5)

## 2020-07-13 LAB — COMPREHENSIVE METABOLIC PANEL
ALT: 22 U/L (ref 0–35)
AST: 18 U/L (ref 0–37)
Albumin: 4 g/dL (ref 3.5–5.2)
Alkaline Phosphatase: 114 U/L (ref 39–117)
BUN: 10 mg/dL (ref 6–23)
CO2: 29 mEq/L (ref 19–32)
Calcium: 8.9 mg/dL (ref 8.4–10.5)
Chloride: 102 mEq/L (ref 96–112)
Creatinine, Ser: 0.58 mg/dL (ref 0.40–1.20)
GFR: 118.58 mL/min (ref >60.00)
Glucose, Bld: 85 mg/dL (ref 70–99)
Potassium: 4.1 mEq/L (ref 3.5–5.1)
Sodium: 138 mEq/L (ref 135–145)
Total Bilirubin: 0.4 mg/dL (ref 0.2–1.2)
Total Protein: 7.2 g/dL (ref 6.0–8.3)

## 2020-07-13 LAB — VITAMIN D 25 HYDROXY (VIT D DEFICIENCY, FRACTURES): VITD: 44.16 ng/mL (ref 30.00–100.00)

## 2020-07-13 LAB — LIPID PANEL
Cholesterol: 232 mg/dL — ABNORMAL HIGH (ref 0–200)
HDL: 47.9 mg/dL (ref >39.00)
LDL Cholesterol: 149 mg/dL — ABNORMAL HIGH (ref 0–99)
NonHDL: 184.51
Total CHOL/HDL Ratio: 5
Triglycerides: 176 mg/dL — ABNORMAL HIGH (ref 0.0–149.0)
VLDL: 35.2 mg/dL (ref 0.0–40.0)

## 2020-07-13 LAB — HEMOGLOBIN A1C: Hgb A1c MFr Bld: 5.7 % (ref 4.6–6.5)

## 2020-07-13 LAB — TSH: TSH: 0.54 u[IU]/mL (ref 0.35–4.50)

## 2020-07-13 MED ORDER — LANSOPRAZOLE 30 MG PO CPDR: 30.0000 mg | DELAYED_RELEASE_CAPSULE | Freq: Every day | ORAL | 3 refills | Status: DC

## 2020-07-13 NOTE — Patient Instructions (Addendum)
Ask Dr. Marcelline Mates about your mammogram   If + covid  There is no medication other than over the counter meds:  Mucinex dm green label for cough.  Vitamin C 1000 mg daily.  Vitamin D3 4000 Iu (units) daily.  Zinc 100 mg daily.  Quercetin 250-500 mg 2 times per day   Elderberry  Oil of oregano  cepacol or chloroseptic spray for sore throat Warm tea with honey and lemon  Hydration  Try to eat though you dont feel like it   Tylenol or Advil  Nasal saline  Flonase   allergy pill over the counter as needed for cough, sneezing, runny nose  Monitor pulse oximeter, buy from [place] if oxygen is less than 90 please go to the hospital.        Are you feeling really sick? Shortness of breath, cough, chest pain?, dizziness? Confusion   If so let me know  If worsening, go to hospital or [facility] clinic Urgent care for further treatment

## 2020-07-13 NOTE — Progress Notes (Signed)
Chief Complaint  Patient presents with  . Annual Exam    No pap, patient sees OBGYN and had Hysterectomy    Annual  doing well except for fatigue in the daytime denies snoring  covid 19 + 06/2020 from work likely with body aches, sore throat, h/a, sinus and chest congestion now sx's improved doing well  Anxiety/depression controlled on lexapro 10 mg qd    Review of Systems  Constitutional: Negative for weight loss.  HENT: Negative for hearing loss.   Eyes: Negative for blurred vision.  Respiratory: Negative for shortness of breath.   Cardiovascular: Negative for chest pain.  Gastrointestinal: Negative for abdominal pain.  Musculoskeletal: Positive for falls. Negative for joint pain.       X 1   Skin: Negative for rash.  Neurological: Negative for headaches.  Psychiatric/Behavioral: Negative for depression. The patient is not nervous/anxious.    Past Medical History:  Diagnosis Date  . Anxiety   . COVID-19    06/2020  . GERD (gastroesophageal reflux disease)   . Herpes genitalis in women   . History of kidney stones    h/o  . Low-lying placenta   . Vitamin D deficiency    Past Surgical History:  Procedure Laterality Date  . ABDOMINAL HYSTERECTOMY     total Dr. Enzo Bi 05/13/17 no h/o abnormal pap for endometriosis and right ovarian cyst  . DIAGNOSTIC LAPAROSCOPY    . ESOPHAGOGASTRODUODENOSCOPY (EGD) WITH PROPOFOL N/A 06/19/2016   Procedure: ESOPHAGOGASTRODUODENOSCOPY (EGD) WITH PROPOFOL;  Surgeon: Lucilla Lame, MD;  Location: [facility] END[facility];  Service: Endoscopy;  Laterality: N/A;  . LAPAROSCOPIC VAGINAL HYSTERECTOMY WITH SALPINGO OOPHORECTOMY Bilateral 05/13/2017   Procedure: LAPAROSCOPIC ASSISTED VAGINAL HYSTERECTOMY WITH BILATERAL SALPINGO OOPHORECTOMY;  Surgeon: Brayton Mars, MD;  Location: [facility] ORS;  Service: Gynecology;  Laterality: Bilateral;  . LAPROSCOPY    . REMOVAL OF DRUG DELIVERY IMPLANT Left 05/13/2017   Procedure: REMOVAL OF DRUG DELIVERY IMPLANT,  LEFT ARM;  Surgeon: Brayton Mars, MD;  Location: [facility] ORS;  Service: Gynecology;  Laterality: Left;  . TONSILLECTOMY    . TONSILLECTOMY     Family History  Problem Relation Age of Onset  . Diabetes Maternal Grandmother   . Diabetes Maternal Grandfather   . Diabetes Mother        type 2  . Other Mother        abnormal mammogram 07/2020   . Diabetes Sister        type 2   . Asthma Daughter   . Diabetes Maternal Aunt        type 2  . Asthma Daughter   . Diabetes Maternal Aunt        type 2   Social History   Socioeconomic History  . Marital status: Married    Spouse name: Not on file  . Number of children: Not on file  . Years of education: Not on file  . Highest education level: Not on file  Occupational History  . Not on file  Tobacco Use  . Smoking status: Never Smoker  . Smokeless tobacco: Never Used  Vaping Use  . Vaping Use: Never used  Substance and Sexual Activity  . Alcohol use: No  . Drug use: No  . Sexual activity: Yes    Birth control/protection: None, Surgical  Other Topics Concern  . 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 Biotin 10000 MCG TABS Take by mouth daily.  . calcium-vitamin D (OSCAL WITH D) 500-200 MG-UNIT tablet Take 1 tablet by mouth.  . escitalopram (LEXAPRO) 10 MG tablet TAKE 1 TABLET (10 MG TOTAL) BY MOUTH DAILY.  Marland Kitchen estradiol (VIVELLE-DOT) 0.1 MG/24HR patch PLACE 1 PATCH (0.1 MG TOTAL) ONTO THE SKIN 2 TIMES A WEEK. Further refills Dr. Marcelline Mates  . [DISCONTINUED] lansoprazole (PREVACID) 30 MG capsule Take 1 capsule (30 mg total) by mouth at  bedtime. 30 minutes before food   No Known Allergies No results found for this or any previous visit (from the past 2160 hour(s)). Objective  Body mass index is 33.67 kg/m. Wt Readings from Last 3 Encounters:  07/13/20 200 lb 9.6 oz (91 kg)  09/13/19 180 lb (81.6 kg)  09/10/19 187 lb 3.2 oz (84.9 kg)   Temp Readings from Last 3 Encounters:  07/13/20 97.8 F (36.6 C) (Oral)  09/13/19 98.2 F (36.8 C) (Oral)  06/23/19 97.9 F (36.6 C) (Skin)   BP Readings from Last 3 Encounters:  07/13/20 122/84  09/13/19 (!) 125/93  09/10/19 102/71   Pulse Readings from Last 3 Encounters:  07/13/20 79  09/13/19 76  09/10/19 71    Physical Exam Vitals and nursing note reviewed.  Constitutional:      Appearance: Normal appearance. She is well-developed and well-groomed. She is obese.  HENT:     Head: Normocephalic and atraumatic.  Eyes:     Conjunctiva/sclera: Conjunctivae normal.     Pupils: Pupils are equal, round, and reactive to light.  Cardiovascular:     Rate and Rhythm: Normal rate and regular rhythm.     Heart sounds: Normal heart sounds. No murmur heard.   Pulmonary:     Effort: Pulmonary effort is normal.     Breath sounds: Normal breath sounds.  Abdominal:     Tenderness: There is no abdominal tenderness.  Skin:    General: Skin is warm and dry.  Neurological:     General: No focal deficit present.     Mental Status: She is alert and oriented to person, place, and time. Mental status is at baseline.     Gait: Gait normal.  Psychiatric:        Attention and Perception: Attention and perception normal.        Mood and Affect: Mood and affect normal.        Speech: Speech normal.        Behavior: Behavior normal. Behavior is cooperative.        Thought Content: Thought content normal.        Cognition and Memory: Cognition and memory normal.        Judgment: Judgment normal.     Assessment  Plan  Annual physical exam - Plan: Comprehensive metabolic panel,  Lipid panel, CBC with Differential/Platelet, TSH Flu shotutd Tdap had 02/09/15 moderna shot 3/3  Declines hep C testing 07/13/20 MMR and hep B immune S/p total hysterectomy 05/23/17 no h/o abnormal pap was for endometriosis and right ovarian cyst rec f/u with Dr. Keturah Barre as sch 07/2018 and ask if  pap still rec.   Colonoscopy consider age 71   Mammogram consider age 52 y.o to baseline   [facility] no issues as of 07/13/20 sch f/u this month yearly   [facility] yearly   [facility] Dr. Enzo Bi; pap 02/12/17  sch 09/13/20 9 am   Est derm no need as of 07/13/20   D3 2000to 5000IU qd rec healthy diet and exercise  Gastroesophageal reflux disease, unspecified whether esophagitis present - Plan: lansoprazole (PREVACID) 30 MG capsule  Fatigue, unspecified type - Plan: Comprehensive metabolic panel, Lipid panel, CBC with Differential/Platelet rec healthy diet and exercise  Denies snoring less likely OSA Check vitamin D    Provider: Dr. Olivia Mackie McLean-Scocuzza-Internal Medicine

## 2020-07-14 ENCOUNTER — Other Ambulatory Visit: Payer: 59

## 2020-07-14 DIAGNOSIS — D72829 Elevated white blood cell count, unspecified: Secondary | ICD-10-CM

## 2020-07-14 DIAGNOSIS — D72820 Lymphocytosis (symptomatic): Secondary | ICD-10-CM

## 2020-07-15 LAB — URINALYSIS, ROUTINE W REFLEX MICROSCOPIC
Bilirubin, UA: NEGATIVE
Glucose, UA: NEGATIVE
Ketones, UA: NEGATIVE
Leukocytes,UA: NEGATIVE
Nitrite, UA: NEGATIVE
Protein,UA: NEGATIVE
RBC, UA: NEGATIVE
Specific Gravity, UA: 1.008 (ref 1.005–1.030)
Urobilinogen, Ur: 0.2 mg/dL (ref 0.2–1.0)
pH, UA: 7 (ref 5.0–7.5)

## 2020-07-18 LAB — PATHOLOGIST SMEAR REVIEW
Basophils Absolute: 0.1 10*3/uL (ref 0.0–0.2)
Basos: 1 %
EOS (ABSOLUTE): 0.3 10*3/uL (ref 0.0–0.4)
Eos: 2 %
Hematocrit: 40.7 % (ref 34.0–46.6)
Hemoglobin: 13.6 g/dL (ref 11.1–15.9)
Immature Grans (Abs): 0 10*3/uL (ref 0.0–0.1)
Immature Granulocytes: 0 %
Lymphocytes Absolute: 3.6 10*3/uL — ABNORMAL HIGH (ref 0.7–3.1)
Lymphs: 32 %
MCH: 29.2 pg (ref 26.6–33.0)
MCHC: 33.4 g/dL (ref 31.5–35.7)
MCV: 88 fL (ref 79–97)
Monocytes Absolute: 0.5 10*3/uL (ref 0.1–0.9)
Monocytes: 4 %
Neutrophils Absolute: 6.8 10*3/uL (ref 1.4–7.0)
Neutrophils: 61 %
Platelets: 345 10*3/uL (ref 150–450)
RBC: 4.65 x10E6/uL (ref 3.77–5.28)
RDW: 13.5 % (ref 11.7–15.4)
WBC: 11.2 10*3/uL — ABNORMAL HIGH (ref 3.4–10.8)

## 2020-07-19 ENCOUNTER — Encounter: Payer: Self-pay | Admitting: Internal Medicine

## 2020-07-19 NOTE — Telephone Encounter (Signed)
-----   Message from Delorise Jackson, MD sent at 07/15/2020  7:38 AM EST ----- Liver kidneys normal  Cholesterol elevated and worse  -mail cholesterol handout  Rec healthy diet and exercise   She has prediabetes A1C 5.7 if were 5.6 no prediabetes   White blood cell ct elevated  -can we add on pathology smear review?   Thyroid lab normal   Vitamin D normal   Urine normal

## 2020-07-19 NOTE — Addendum Note (Signed)
Addended by: Orland Mustard on: 07/19/2020 01:04 PM   Modules accepted: Orders

## 2020-07-25 ENCOUNTER — Inpatient Hospital Stay: Payer: 59 | Attending: Oncology | Admitting: Oncology

## 2020-07-25 ENCOUNTER — Encounter: Payer: Self-pay | Admitting: Oncology

## 2020-07-25 ENCOUNTER — Inpatient Hospital Stay: Payer: 59

## 2020-07-25 VITALS — BP 111/92 | HR 84 | Temp 97.7°F | Resp 18 | Ht 64.72 in | Wt 200.5 lb

## 2020-07-25 DIAGNOSIS — Z8616 Personal history of COVID-19: Secondary | ICD-10-CM | POA: Insufficient documentation

## 2020-07-25 DIAGNOSIS — N644 Mastodynia: Secondary | ICD-10-CM | POA: Insufficient documentation

## 2020-07-25 DIAGNOSIS — F419 Anxiety disorder, unspecified: Secondary | ICD-10-CM | POA: Insufficient documentation

## 2020-07-25 DIAGNOSIS — Z79899 Other long term (current) drug therapy: Secondary | ICD-10-CM | POA: Insufficient documentation

## 2020-07-25 DIAGNOSIS — E559 Vitamin D deficiency, unspecified: Secondary | ICD-10-CM

## 2020-07-25 DIAGNOSIS — D72820 Lymphocytosis (symptomatic): Secondary | ICD-10-CM | POA: Diagnosis not present

## 2020-07-25 LAB — CBC WITH DIFFERENTIAL/PLATELET
Abs Immature Granulocytes: 0.02 10*3/uL (ref 0.00–0.07)
Basophils Absolute: 0.1 10*3/uL (ref 0.0–0.1)
Basophils Relative: 1 %
Eosinophils Absolute: 0.3 10*3/uL (ref 0.0–0.5)
Eosinophils Relative: 3 %
HCT: 40.1 % (ref 36.0–46.0)
Hemoglobin: 13.5 g/dL (ref 12.0–15.0)
Immature Granulocytes: 0 %
Lymphocytes Relative: 37 %
Lymphs Abs: 4.1 10*3/uL — ABNORMAL HIGH (ref 0.7–4.0)
MCH: 29.2 pg (ref 26.0–34.0)
MCHC: 33.7 g/dL (ref 30.0–36.0)
MCV: 86.6 fL (ref 80.0–100.0)
Monocytes Absolute: 0.4 10*3/uL (ref 0.1–1.0)
Monocytes Relative: 4 %
Neutro Abs: 6.1 10*3/uL (ref 1.7–7.7)
Neutrophils Relative %: 55 %
Platelets: 346 10*3/uL (ref 150–400)
RBC: 4.63 MIL/uL (ref 3.87–5.11)
RDW: 13 % (ref 11.5–15.5)
WBC: 11.1 10*3/uL — ABNORMAL HIGH (ref 4.0–10.5)
nRBC: 0 % (ref 0.0–0.2)

## 2020-07-25 LAB — HEPATITIS C ANTIBODY: HCV Ab: NONREACTIVE

## 2020-07-25 LAB — LACTATE DEHYDROGENASE: LDH: 172 U/L (ref 98–192)

## 2020-07-25 NOTE — Progress Notes (Signed)
Patient here to establish care. Pt reports extreme exhaustion

## 2020-07-25 NOTE — Progress Notes (Signed)
Hematology/Oncology Consult note [facility] [phone] [phone]   Patient Care Team: McLean-Scocuzza, Nino Glow, MD as PCP - General (Internal Medicine)  REFERRING PROVIDER: McLean-Scocuzza, Nino Glow, MD  CHIEF COMPLAINTS/REASON FOR VISIT:  Evaluation of leukocytosis  HISTORY OF PRESENTING ILLNESS:  Rebecca Evans is a  35 y.o.  female with PMH listed below who was referred to me for evaluation of leukocytosis Reviewed patient' recent labs obtained by PCP.   07/14/2020 CBC showed elevated white count of 11.2, normal hemoglobin and platelet count.  Differential showed lymphocytosis with absolute lymphocyte 3.6. Previous lab records reviewed. Leukocytosis onset of chronic and intermittent, duration is since at least 2014.  No aggravating or elevated factors. Associated symptoms or signs:  Denies weight loss, fever, chills,night sweats.  She does report feeling exhausted.  Smoking history: Denies History of recent oral steroid use or steroid injection: Denies History of recent infection: Denies Autoimmune disease history.  Denies.  Patient also has a history of hysterectomy with bilateral salpingo-oophorectomy.  She is on estrogen patch.  She complains chronic intermittent left breast fullness, pain.  She has appointment with GYN for further discussion.  Review of Systems  Constitutional: Positive for fatigue. Negative for appetite change, chills and fever.  HENT:   Negative for hearing loss and voice change.   Eyes: Negative for eye problems.  Respiratory: Negative for chest tightness and cough.   Cardiovascular: Negative for chest pain.  Gastrointestinal: Negative for abdominal distention, abdominal pain and blood in stool.  Endocrine: Negative for hot flashes.  Genitourinary: Negative for difficulty urinating and frequency.   Musculoskeletal: Negative for arthralgias.  Skin: Negative for itching and rash.  Neurological:  Negative for extremity weakness.  Hematological: Negative for adenopathy.  Psychiatric/Behavioral: Negative for confusion.     MEDICAL HISTORY:  Past Medical History:  Diagnosis Date  . Anxiety   . COVID-19    06/2020  . GERD (gastroesophageal reflux disease)   . Herpes genitalis in women   . History of kidney stones    h/o  . Low-lying placenta   . Vitamin D deficiency     SURGICAL HISTORY: Past Surgical History:  Procedure Laterality Date  . ABDOMINAL HYSTERECTOMY     total Dr. Enzo Bi 05/13/17 no h/o abnormal pap for endometriosis and right ovarian cyst  . DIAGNOSTIC LAPAROSCOPY    . ESOPHAGOGASTRODUODENOSCOPY (EGD) WITH PROPOFOL N/A 06/19/2016   Procedure: ESOPHAGOGASTRODUODENOSCOPY (EGD) WITH PROPOFOL;  Surgeon: Lucilla Lame, MD;  Location: [facility];  Service: Endoscopy;  Laterality: N/A;  . LAPAROSCOPIC VAGINAL HYSTERECTOMY WITH SALPINGO OOPHORECTOMY Bilateral 05/13/2017   Procedure: LAPAROSCOPIC ASSISTED VAGINAL HYSTERECTOMY WITH BILATERAL SALPINGO OOPHORECTOMY;  Surgeon: Brayton Mars, MD;  Location: [facility] ORS;  Service: Gynecology;  Laterality: Bilateral;  . LAPROSCOPY    . REMOVAL OF DRUG DELIVERY IMPLANT Left 05/13/2017   Procedure: REMOVAL OF DRUG DELIVERY IMPLANT, LEFT ARM;  Surgeon: Brayton Mars, MD;  Location: [facility] ORS;  Service: Gynecology;  Laterality: Left;  . TONSILLECTOMY    . TONSILLECTOMY      SOCIAL HISTORY: Social History   Socioeconomic History  . Marital status: Married    Spouse name: Not on file  . Number of children: Not on file  . Years of education: Not on file  . Highest education level: Not on file  Occupational History  . Not on file  Tobacco Use  . Smoking status: Never Smoker  . Smokeless tobacco: Never Used  Vaping Use  . Vaping Use: Never used  Substance and Sexual Activity  . Alcohol use: No  . Drug use: No  . Sexual activity: Yes    Birth control/protection: None, Surgical  Other Topics Concern   . 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 Diabetes Maternal Grandmother   . Diabetes Maternal Grandfather   . Diabetes Mother        type 2  . Other Mother        abnormal mammogram 07/2020   . Diabetes Sister        type 2   . Asthma Daughter   . Diabetes Maternal Aunt        type 2  . Asthma Daughter   . Diabetes Maternal Aunt        type 2    ALLERGIES:  has No Known Allergies.  MEDICATIONS:  Current Outpatient Medications  Medication Sig Dispense Refill  . Biotin 10000 MCG TABS Take by mouth daily.    . calcium-vitamin D (OSCAL WITH D) 500-200 MG-UNIT tablet Take 1 tablet by mouth.    . escitalopram (LEXAPRO) 10 MG tablet TAKE 1 TABLET (10 MG TOTAL) BY MOUTH DAILY. 90 tablet 3  . estradiol (VIVELLE-DOT) 0.1 MG/24HR patch PLACE 1 PATCH (0.1 MG TOTAL) ONTO THE SKIN 2 TIMES A WEEK. Further refills Dr. Marcelline Mates 8 patch 11  . lansoprazole (PREVACID) 30 MG capsule Take 1 capsule (30 mg total) by mouth at bedtime. 30 minutes before food 90 capsule 3   No current facility-administered medications for this visit.     PHYSICAL EXAMINATION: ECOG PERFORMANCE STATUS: 0 - Asymptomatic Vitals:   07/25/20 1101  BP: (!) 111/92  Pulse: 84  Resp: 18  Temp: 97.7 F (36.5 C)   Filed Weights   07/25/20 1101  Weight: 200 lb 8 oz (90.9 kg)    Physical Exam Constitutional:      General: She is not in acute distress. HENT:     Head: Normocephalic and atraumatic.  Eyes:     General: No scleral icterus. Cardiovascular:     Rate and Rhythm: Normal rate and  regular rhythm.     Heart sounds: Normal heart sounds.  Pulmonary:     Effort: Pulmonary effort is normal. No respiratory distress.     Breath sounds: No wheezing.  Abdominal:     General: Bowel sounds are normal. There is no distension.     Palpations: Abdomen is soft.  Musculoskeletal:        General: No deformity. Normal range of motion.     Cervical back: Normal range of motion and neck supple.  Skin:    General: Skin is warm and dry.     Findings: No erythema or rash.  Neurological:     Mental Status: She is alert and oriented to person, place, and time. Mental status is at baseline.     Cranial Nerves: No cranial nerve deficit.     Coordination: Coordination normal.  Psychiatric:  Mood and Affect: Mood normal.   Patient deferred breast examination as she recently was examined by primary care provider  CMP Latest Ref Rng & Units 07/13/2020  Glucose 70 - 99 mg/dL 85  BUN 6 - 23 mg/dL 10  Creatinine 0.40 - 1.20 mg/dL 0.58  Sodium 135 - 145 mEq/L 138  Potassium 3.5 - 5.1 mEq/L 4.1  Chloride 96 - 112 mEq/L 102  CO2 19 - 32 mEq/L 29  Calcium 8.4 - 10.5 mg/dL 8.9  Total Protein 6.0 - 8.3 g/dL 7.2  Total Bilirubin 0.2 - 1.2 mg/dL 0.4  Alkaline Phos 39 - 117 U/L 114  AST 0 - 37 U/L 18  ALT 0 - 35 U/L 22   CBC Latest Ref Rng & Units 07/25/2020  WBC 4.0 - 10.5 K/uL 11.1(H)  Hemoglobin 12.0 - 15.0 g/dL 13.5  Hematocrit 36.0 - 46.0 % 40.1  Platelets 150 - 400 K/uL 346     RADIOGRAPHIC STUDIES: I have personally reviewed the radiological images as listed and agreed with the findings in the report. No results found.  LABORATORY DATA:  I have reviewed the data as listed Lab Results  Component Value Date   WBC 11.1 (H) 07/25/2020   HGB 13.5 07/25/2020   HCT 40.1 07/25/2020   MCV 86.6 07/25/2020   PLT 346 07/25/2020   Recent Labs    07/13/20 1115  NA 138  K 4.1  CL 102  CO2 29  GLUCOSE 85  BUN 10  CREATININE 0.58  CALCIUM 8.9  PROT 7.2  ALBUMIN 4.0  AST  18  ALT 22  ALKPHOS 114  BILITOT 0.4   Iron/TIBC/Ferritin/ %Sat    Component Value Date/Time   IRON 92 02/05/2018 0917   TIBC 293 02/05/2018 0917   FERRITIN 51 02/05/2018 0917   FERRITIN 118 02/12/2017 1422   IRONPCTSAT 31 02/05/2018 0917        ASSESSMENT & PLAN:  1. Lymphocytosis   2. Breast pain, left    Labs reviewed and discussed with patient that Leukocytosis, predominantly neutrophilia, can be secondary to infection, chronic inflammation, smoking, autoimmune disease, or underlying bone marrow disorders.   For the work up of patient's leukocytosis, I recommend checking CBC;CMP, LDH, smear review, flowcytometry, JAK2 mutation with reflex etc.  Left breast pain, wondering if this is due to hormone replacement therapy.  Patient plans to further discuss with gynecology.  I will hold off work-up at this point. Orders Placed This Encounter  Procedures  . CBC with Differential/Platelet    Standing Status:   Future    Number of Occurrences:   1    Standing Expiration Date:   07/25/2021  . Lactate dehydrogenase    Standing Status:   Future    Number of Occurrences:   1    Standing Expiration Date:   07/25/2021  . Flow cytometry panel-leukemia/lymphoma work-up    Standing Status:   Future    Number of Occurrences:   1    Standing Expiration Date:   07/25/2021  . Multiple Myeloma Panel (SPEP&IFE w/QIG)    Standing Status:   Future    Number of Occurrences:   1    Standing Expiration Date:   07/25/2021  . Kappa/lambda light chains    Standing Status:   Future    Number of Occurrences:   1    Standing Expiration Date:   07/25/2021  . Hepatitis C antibody    Standing Status:   Future    Number of  Occurrences:   1    Standing Expiration Date:   07/25/2021  . JAK2 V617F, w Reflex to CALR/E12/MPL    Standing Status:   Future    Number of Occurrences:   1    Standing Expiration Date:   07/25/2021    All questions were answered. The patient knows to call the clinic with any  problems questions or concerns.  Return of visit: 2 weeks to discuss labs. Thank you for this kind referral and the opportunity to participate in the care of this patient. A copy of today's note is routed to referring provider   Earlie Server, MD, PhD Hematology Oncology [facility] Pager- [phone] 07/25/2020

## 2020-07-26 ENCOUNTER — Other Ambulatory Visit: Payer: Self-pay

## 2020-07-26 DIAGNOSIS — D72820 Lymphocytosis (symptomatic): Secondary | ICD-10-CM

## 2020-07-26 LAB — MULTIPLE MYELOMA PANEL, SERUM
Albumin SerPl Elph-Mcnc: 3.3 g/dL (ref 2.9–4.4)
Albumin/Glob SerPl: 0.9 (ref 0.7–1.7)
Alpha 1: 0.3 g/dL (ref 0.0–0.4)
Alpha2 Glob SerPl Elph-Mcnc: 1 g/dL (ref 0.4–1.0)
B-Globulin SerPl Elph-Mcnc: 1.1 g/dL (ref 0.7–1.3)
Gamma Glob SerPl Elph-Mcnc: 1.4 g/dL (ref 0.4–1.8)
Globulin, Total: 3.7 g/dL (ref 2.2–3.9)
IgA: 220 mg/dL (ref 87–352)
IgG (Immunoglobin G), Serum: 1238 mg/dL (ref 586–1602)
IgM (Immunoglobulin M), Srm: 189 mg/dL (ref 26–217)
Total Protein ELP: 7 g/dL (ref 6.0–8.5)

## 2020-07-26 LAB — TECHNOLOGIST SMEAR REVIEW
Plt Morphology: NORMAL
RBC Morphology: NORMAL
WBC Morphology: NORMAL

## 2020-07-26 LAB — KAPPA/LAMBDA LIGHT CHAINS
Kappa free light chain: 18.8 mg/L (ref 3.3–19.4)
Kappa, lambda light chain ratio: 1.04 (ref 0.26–1.65)
Lambda free light chains: 18.1 mg/L (ref 5.7–26.3)

## 2020-07-27 LAB — COMP PANEL: LEUKEMIA/LYMPHOMA

## 2020-07-29 DIAGNOSIS — H5213 Myopia, bilateral: Secondary | ICD-10-CM | POA: Diagnosis not present

## 2020-08-02 DIAGNOSIS — D72829 Elevated white blood cell count, unspecified: Secondary | ICD-10-CM

## 2020-08-02 DIAGNOSIS — D72820 Lymphocytosis (symptomatic): Secondary | ICD-10-CM

## 2020-08-02 HISTORY — DX: Elevated white blood cell count, unspecified: D72.829

## 2020-08-02 HISTORY — DX: Lymphocytosis (symptomatic): D72.820

## 2020-08-09 ENCOUNTER — Inpatient Hospital Stay: Payer: 59 | Attending: Oncology | Admitting: Oncology

## 2020-08-09 ENCOUNTER — Encounter: Payer: Self-pay | Admitting: Oncology

## 2020-08-09 DIAGNOSIS — D72820 Lymphocytosis (symptomatic): Secondary | ICD-10-CM | POA: Diagnosis not present

## 2020-08-09 NOTE — Progress Notes (Signed)
HEMATOLOGY-ONCOLOGY TeleHEALTH VISIT PROGRESS NOTE  I connected with Rebecca Evans on 08/09/20  at  2:30 PM EST by video enabled telemedicine visit and verified that I am speaking with the correct person using two identifiers. I discussed the limitations, risks, security and privacy concerns of performing an evaluation and management service by telemedicine and the availability of in-person appointments. The patient expressed understanding and agreed to proceed.   Other persons participating in the visit and their role in the encounter:  None  Patient's location: Home  Provider's location: office Chief Complaint: Follow-up for leukocytosis   INTERVAL HISTORY Rebecca Evans is a 35 y.o. female who has above history reviewed by me today presents for follow up visit for leukocytosis Problems and complaints are listed below:  Patient had a blood work done 2 weeks ago and presents virtually to discuss results and management plan.  No new complaints.  Review of Systems  Constitutional: Negative for appetite change, chills, fatigue and fever.  HENT:   Negative for hearing loss and voice change.   Eyes: Negative for eye problems.  Respiratory: Negative for chest tightness and cough.   Cardiovascular: Negative for chest pain.  Gastrointestinal: Negative for abdominal distention, abdominal pain and blood in stool.  Endocrine: Negative for hot flashes.  Genitourinary: Negative for difficulty urinating and frequency.   Musculoskeletal: Negative for arthralgias.  Skin: Negative for itching and rash.  Neurological: Negative for extremity weakness.  Hematological: Negative for adenopathy.  Psychiatric/Behavioral: Negative for confusion.    Past Medical History:  Diagnosis Date  . Anxiety   . COVID-19    06/2020  . GERD (gastroesophageal reflux disease)   . Herpes genitalis in women   . History of kidney stones    h/o  . Low-lying placenta   . Vitamin D deficiency    Past  Surgical History:  Procedure Laterality Date  . ABDOMINAL HYSTERECTOMY     total Dr. Enzo Bi 05/13/17 no h/o abnormal pap for endometriosis and right ovarian cyst  . DIAGNOSTIC LAPAROSCOPY    . ESOPHAGOGASTRODUODENOSCOPY (EGD) WITH PROPOFOL N/A 06/19/2016   Procedure: ESOPHAGOGASTRODUODENOSCOPY (EGD) WITH PROPOFOL;  Surgeon: Lucilla Lame, MD;  Location: [facility];  Service: Endoscopy;  Laterality: N/A;  . LAPAROSCOPIC VAGINAL HYSTERECTOMY WITH SALPINGO OOPHORECTOMY Bilateral 05/13/2017   Procedure: LAPAROSCOPIC ASSISTED VAGINAL HYSTERECTOMY WITH BILATERAL SALPINGO OOPHORECTOMY;  Surgeon: Brayton Mars, MD;  Location: [facility] ORS;  Service: Gynecology;  Laterality: Bilateral;  . LAPROSCOPY    . REMOVAL OF DRUG DELIVERY IMPLANT Left 05/13/2017   Procedure: REMOVAL OF DRUG DELIVERY IMPLANT, LEFT ARM;  Surgeon: Brayton Mars, MD;  Location: [facility] ORS;  Service: Gynecology;  Laterality: Left;  . TONSILLECTOMY    . TONSILLECTOMY      Family History  Problem Relation Age of Onset  . Diabetes Maternal Grandmother   . Diabetes Maternal Grandfather   . Diabetes Mother        type 2  . Other Mother        abnormal mammogram 07/2020   . Diabetes Sister        type 2   . Asthma Daughter   . Diabetes Maternal Aunt        type 2  . Asthma Daughter   . Diabetes Maternal Aunt        type 2    Social History   Socioeconomic History  . Marital status: Married    Spouse name: Not on file  . Number of children: Not on file  .  Years of education: Not on file  . Highest education level: Not on file  Occupational History  . Not on file  Tobacco Use  . Smoking status: Never Smoker  . Smokeless tobacco: Never Used  Vaping Use  . Vaping Use: Never used  Substance and Sexual Activity  . Alcohol use: No  . Drug use: No  . Sexual activity: Yes    Birth control/protection: None, Surgical  Other Topics Concern  . 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 Biotin 10000 MCG TABS Take by mouth daily.    . calcium-vitamin D (OSCAL WITH D) 500-200 MG-UNIT tablet Take 1 tablet by mouth.    . escitalopram (LEXAPRO) 10 MG tablet TAKE 1 TABLET (10 MG TOTAL) BY MOUTH DAILY. 90 tablet 3  . estradiol (VIVELLE-DOT) 0.1 MG/24HR patch PLACE 1 PATCH (0.1 MG TOTAL) ONTO THE SKIN 2 TIMES A WEEK. Further refills Dr. Marcelline Mates 8 patch 11  . lansoprazole (PREVACID) 30 MG capsule Take 1 capsule (30 mg total) by mouth at bedtime. 30 minutes before food 90 capsule 3   No current facility-administered medications on file prior to visit.    No Known Allergies     Observations/Objective: Today's Vitals   08/09/20 1413  PainSc: 0-No pain   There is no height or weight on file to calculate BMI.  Physical Exam Neurological:     Mental Status: She is alert.     CBC    Component Value Date/Time   WBC 11.1 (H) 07/25/2020 1129   RBC 4.63 07/25/2020 1129   HGB 13.5 07/25/2020 1129   HGB 13.6 07/14/2020 0920   HGB 13.4 07/06/2014 0000   HCT 40.1 07/25/2020 1129   HCT 40.7 07/14/2020 0920   HCT 39 07/06/2014 0000   PLT 346 07/25/2020 1129   PLT 345 07/14/2020 0920   PLT 364 07/06/2014 0000   MCV 86.6 07/25/2020 1129   MCV 88 07/14/2020 0920   MCV 84 05/06/2013 0621   MCH 29.2 07/25/2020 1129   MCHC 33.7 07/25/2020 1129   RDW 13.0 07/25/2020 1129   RDW 13.5 07/14/2020 0920   RDW 14.5 05/06/2013 0621   LYMPHSABS 4.1 (H) 07/25/2020 1129   LYMPHSABS 3.6 (H) 07/14/2020 0920   LYMPHSABS 3.8 (H) 05/06/2013 0621   MONOABS 0.4 07/25/2020  1129   MONOABS 0.7 05/06/2013 0621   EOSABS 0.3 07/25/2020 1129   EOSABS 0.3 07/14/2020 0920   EOSABS 0.2 05/06/2013 0621   BASOSABS 0.1 07/25/2020 1129   BASOSABS 0.1 07/14/2020 0920   BASOSABS 0.1 05/06/2013 0621    CMP     Component Value Date/Time   NA 138 07/13/2020 1115   NA 140 06/24/2019 0835   K 4.1 07/13/2020 1115   CL 102 07/13/2020 1115   CO2 29 07/13/2020 1115   GLUCOSE 85 07/13/2020 1115   BUN 10 07/13/2020 1115   BUN 7 06/24/2019 0835   CREATININE 0.58  07/13/2020 1115   CALCIUM 8.9 07/13/2020 1115   PROT 7.2 07/13/2020 1115   PROT 7.2 06/24/2019 0835   ALBUMIN 4.0 07/13/2020 1115   ALBUMIN 4.2 06/24/2019 0835   AST 18 07/13/2020 1115   ALT 22 07/13/2020 1115   ALKPHOS 114 07/13/2020 1115   BILITOT 0.4 07/13/2020 1115   BILITOT 0.4 06/24/2019 0835   GFRNONAA 121 06/24/2019 0835   GFRAA 139 06/24/2019 0835     Assessment and Plan: 1. Lymphocytosis     #Lymphocytosis, chronic, Labs are reviewed and discussed with patient CBC showed mild leukocytosis of total white count 11.1, predominantly lymphocytosis. Patient has normal LDH, negative hepatitis C, peripheral blood flow cytometry is negative for immunophenotypic abnormality. JAK2 V617F mutation with reflex results are pending. I discussed with patient that lymphocytosis likely reactive in nature, less likely due to primary bone marrow process and less JAK2 mutation or other mutation comes back positive. Chronic estrogen replacement may contribute to leukocytosis however onset of leukocytosis was a few years prior to her started on estrogen. I will hold off additional work-up at this point.  Patient is asymptomatic with no constitutional symptoms.  Follow Up Instructions: Recommend patient to follow-up in 1 year.   I discussed the assessment and treatment plan with the patient. The patient was provided an opportunity to ask questions and all were answered. The patient agreed with the plan and  demonstrated an understanding of the instructions.  The patient was advised to call back or seek an in-person evaluation if the symptoms worsen or if the condition fails to improve as anticipated.    Earlie Server, MD 08/09/2020 11:06 PM

## 2020-08-09 NOTE — Progress Notes (Signed)
Patient contacted for Mychart visit. No new concerns voiced.  

## 2020-08-12 LAB — CALR + JAK2 E12-15 + MPL (REFLEXED)

## 2020-08-12 LAB — JAK2 V617F, W REFLEX TO CALR/E12/MPL

## 2020-09-02 ENCOUNTER — Encounter: Payer: Self-pay | Admitting: Internal Medicine

## 2020-09-06 ENCOUNTER — Other Ambulatory Visit: Payer: Self-pay

## 2020-09-06 ENCOUNTER — Other Ambulatory Visit: Payer: Self-pay | Admitting: Internal Medicine

## 2020-09-06 DIAGNOSIS — E669 Obesity, unspecified: Secondary | ICD-10-CM

## 2020-09-06 MED ORDER — PHENTERMINE HCL 37.5 MG PO TABS
37.5000 mg | ORAL_TABLET | Freq: Every day | ORAL | 0 refills | Status: DC
  Filled 2020-09-06: qty 60, 60d supply, fill #0

## 2020-09-12 NOTE — Patient Instructions (Addendum)
Start Wellbutrin 1 tablet daily.  Decrease to 1/2 tablet of Lexapro daily x 2 weeks, then decrease again to 1/2 tablet every other day for 2 weeks, then discontinue.     Preventive Care 76-34 Years Old, Female Preventive care refers to lifestyle choices and visits with your health care provider that can promote health and wellness. This includes:  A yearly physical exam. This is also called an annual wellness visit.  Regular dental and eye exams.  Immunizations.  Screening for certain conditions.  Healthy lifestyle choices, such as: ? Eating a healthy diet. ? Getting regular exercise. ? Not using drugs or products that contain nicotine and tobacco. ? Limiting alcohol use. What can I expect for my preventive care visit? Physical exam Your health care provider may check your:  Height and weight. These may be used to calculate your BMI (body mass index). BMI is a measurement that tells if you are at a healthy weight.  Heart rate and blood pressure.  Body temperature.  Skin for abnormal spots. Counseling Your health care provider may ask you questions about your:  Past medical problems.  Family's medical history.  Alcohol, tobacco, and drug use.  Emotional well-being.  Home life and relationship well-being.  Sexual activity.  Diet, exercise, and sleep habits.  Work and work Statistician.  Access to firearms.  Method of birth control.  Menstrual cycle.  Pregnancy history. What immunizations do I need? Vaccines are usually given at various ages, according to a schedule. Your health care provider will recommend vaccines for you based on your age, medical history, and lifestyle or other factors, such as travel or where you work.   What tests do I need? Blood tests  Lipid and cholesterol levels. These may be checked every 5 years starting at age 34.  Hepatitis C test.  Hepatitis B test. Screening  Diabetes screening. This is done by checking your blood  sugar (glucose) after you have not eaten for a while (fasting).  STD (sexually transmitted disease) testing, if you are at risk.  BRCA-related cancer screening. This may be done if you have a family history of breast, ovarian, tubal, or peritoneal cancers.  Pelvic exam and Pap test. This may be done every 3 years starting at age 34. Starting at age 34, this may be done every 5 years if you have a Pap test in combination with an HPV test. Talk with your health care provider about your test results, treatment options, and if necessary, the need for more tests.   Follow these instructions at home: Eating and drinking  Eat a healthy diet that includes fresh fruits and vegetables, whole grains, lean protein, and low-fat dairy products.  Take vitamin and mineral supplements as recommended by your health care provider.  Do not drink alcohol if: ? Your health care provider tells you not to drink. ? You are pregnant, may be pregnant, or are planning to become pregnant.  If you drink alcohol: ? Limit how much you have to 0-1 drink a day. ? Be aware of how much alcohol is in your drink. In the [place] one drink equals one 12 oz bottle of beer (355 mL), one 5 oz glass of wine (148 mL), or one 1 oz glass of hard liquor (44 mL).   Lifestyle  Take daily care of your teeth and gums. Brush your teeth every morning and night with fluoride toothpaste. Floss one time each day.  Stay active. Exercise for at least 30 minutes 5 or more  days each week.  Do not use any products that contain nicotine or tobacco, such as cigarettes, e-cigarettes, and chewing tobacco. If you need help quitting, ask your health care provider.  Do not use drugs.  If you are sexually active, practice safe sex. Use a condom or other form of protection to prevent STIs (sexually transmitted infections).  If you do not wish to become pregnant, use a form of birth control. If you plan to become pregnant, see your health care provider  for a prepregnancy visit.  Find healthy ways to cope with stress, such as: ? Meditation, yoga, or listening to music. ? Journaling. ? Talking to a trusted person. ? Spending time with friends and family. Safety  Always wear your seat belt while driving or riding in a vehicle.  Do not drive: ? If you have been drinking alcohol. Do not ride with someone who has been drinking. ? When you are tired or distracted. ? While texting.  Wear a helmet and other protective equipment during sports activities.  If you have firearms in your house, make sure you follow all gun safety procedures.  Seek help if you have been physically or sexually abused. What's next?  Go to your health care provider once a year for an annual wellness visit.  Ask your health care provider how often you should have your eyes and teeth checked.  Stay up to date on all vaccines. This information is not intended to replace advice given to you by your health care provider. Make sure you discuss any questions you have with your health care provider. Document Revised: 01/17/2020 Document Reviewed: 01/30/2018 Elsevier Patient Education  2021 [place] Breast self-awareness is knowing how your breasts look and feel. Doing breast self-awareness is important. It allows you to catch a breast problem early while it is still small and can be treated. All women should do breast self-awareness, including women who have had breast implants. Tell your doctor if you notice a change in your breasts. What you need:  A mirror.  A well-lit room. How to do a breast self-exam A breast self-exam is one way to learn what is normal for your breasts and to check for changes. To do a breast self-exam: Look for changes 1. Take off all the clothes above your waist. 2. Stand in front of a mirror in a room with good lighting. 3. Put your hands on your hips. 4. Push your hands down. 5. Look at your breasts and  nipples in the mirror to see if one breast or nipple looks different from the other. Check to see if: ? The shape of one breast is different. ? The size of one breast is different. ? There are wrinkles, dips, and bumps in one breast and not the other. 6. Look at each breast for changes in the skin, such as: ? Redness. ? Scaly areas. 7. Look for changes in your nipples, such as: ? Liquid around the nipples. ? Bleeding. ? Dimpling. ? Redness. ? A change in where the nipples are.   Feel for changes 1. Lie on your back on the floor. 2. Feel each breast. To do this, follow these steps: ? Pick a breast to feel. ? Put the arm closest to that breast above your head. ? Use your other arm to feel the nipple area of your breast. Feel the area with the pads of your three middle fingers by making small circles with your fingers. For the first circle,  press lightly. For the second circle, press harder. For the third circle, press even harder. ? Keep making circles with your fingers at the different pressures as you move down your breast. Stop when you feel your ribs. ? Move your fingers a little toward the center of your body. ? Start making circles with your fingers again, this time going up until you reach your collarbone. ? Keep making up-and-down circles until you reach your armpit. Remember to keep using the three pressures. ? Feel the other breast in the same way. 3. Sit or stand in the tub or shower. 4. With soapy water on your skin, feel each breast the same way you did in step 2 when you were lying on the floor.   Write down what you find Writing down what you find can help you remember what to tell your doctor. Write down:  What is normal for each breast.  Any changes you find in each breast, including: ? The kind of changes you find. ? Whether you have pain. ? Size and location of any lumps.  When you last had your menstrual period. General tips  Check your breasts every  month.  If you are breastfeeding, the best time to check your breasts is after you feed your baby or after you use a breast pump.  If you get menstrual periods, the best time to check your breasts is 5-7 days after your menstrual period is over.  With time, you will become comfortable with the self-exam, and you will begin to know if there are changes in your breasts. Contact a doctor if you:  See a change in the shape or size of your breasts or nipples.  See a change in the skin of your breast or nipples, such as red or scaly skin.  Have fluid coming from your nipples that is not normal.  Find a lump or thick area that was not there before.  Have pain in your breasts.  Have any concerns about your breast health. Summary  Breast self-awareness includes looking for changes in your breasts, as well as feeling for changes within your breasts.  Breast self-awareness should be done in front of a mirror in a well-lit room.  You should check your breasts every month. If you get menstrual periods, the best time to check your breasts is 5-7 days after your menstrual period is over.  Let your doctor know of any changes you see in your breasts, including changes in size, changes on the skin, pain or tenderness, or fluid from your nipples that is not normal. This information is not intended to replace advice given to you by your health care provider. Make sure you discuss any questions you have with your health care provider. Document Revised: 01/07/2018 Document Reviewed: 01/07/2018 Elsevier Patient Education  2021 [place].   Bupropion extended-release tablets (Depression/Mood Disorders) What is this medicine? BUPROPION (byoo PROE pee on) is used to treat depression. This medicine may be used for other purposes; ask your health care provider or pharmacist if you have questions. COMMON BRAND NAME(S): Aplenzin, Budeprion XL, Forfivo XL, Wellbutrin XL What should I tell my health care  provider before I take this medicine? They need to know if you have any of these conditions:  an eating disorder, such as anorexia or bulimia  bipolar disorder or psychosis  diabetes or high blood sugar, treated with medication  glaucoma  head injury or brain tumor  heart disease, previous heart attack, or irregular heart  beat  high blood pressure  kidney or liver disease  seizures (convulsions)  suicidal thoughts or a previous suicide attempt  Tourette's syndrome  weight loss  an unusual or allergic reaction to bupropion, other medicines, foods, dyes, or preservatives  breast-feeding  pregnant or trying to become pregnant How should I use this medicine? Take this medicine by mouth with a glass of water. Follow the directions on the prescription label. You can take it with or without food. If it upsets your stomach, take it with food. Do not crush, chew, or cut these tablets. This medicine is taken once daily at the same time each day. Do not take your medicine more often than directed. Do not stop taking this medicine suddenly except upon the advice of your doctor. Stopping this medicine too quickly may cause serious side effects or your condition may worsen. A special MedGuide will be given to you by the pharmacist with each prescription and refill. Be sure to read this information carefully each time. Talk to your pediatrician regarding the use of this medicine in children. Special care may be needed. Overdosage: If you think you have taken too much of this medicine contact a poison control center or emergency room at once. NOTE: This medicine is only for you. Do not share this medicine with others. What if I miss a dose? If you miss a dose, skip the missed dose and take your next tablet at the regular time. Do not take double or extra doses. What may interact with this medicine? Do not take this medicine with any of the following medications:  linezolid  MAOIs like  Azilect, Carbex, Eldepryl, Marplan, Nardil, and Parnate  methylene blue (injected into a vein)  other medicines that contain bupropion like Zyban This medicine may also interact with the following medications:  alcohol  certain medicines for anxiety or sleep  certain medicines for blood pressure like metoprolol, propranolol  certain medicines for depression or psychotic disturbances  certain medicines for HIV or AIDS like efavirenz, lopinavir, nelfinavir, ritonavir  certain medicines for irregular heart beat like propafenone, flecainide  certain medicines for Parkinson's disease like amantadine, levodopa  certain medicines for seizures like carbamazepine, phenytoin, phenobarbital  cimetidine  clopidogrel  cyclophosphamide  digoxin  furazolidone  isoniazid  nicotine  orphenadrine  procarbazine  steroid medicines like prednisone or cortisone  stimulant medicines for attention disorders, weight loss, or to stay awake  tamoxifen  theophylline  thiotepa  ticlopidine  tramadol  warfarin This list may not describe all possible interactions. Give your health care provider a list of all the medicines, herbs, non-prescription drugs, or dietary supplements you use. Also tell them if you smoke, drink alcohol, or use illegal drugs. Some items may interact with your medicine. What should I watch for while using this medicine? Tell your doctor if your symptoms do not get better or if they get worse. Visit your doctor or healthcare provider for regular checks on your progress. Because it may take several weeks to see the full effects of this medicine, it is important to continue your treatment as prescribed by your doctor. This medicine may cause serious skin reactions. They can happen weeks to months after starting the medicine. Contact your healthcare provider right away if you notice fevers or flu-like symptoms with a rash. The rash may be red or purple and then turn  into blisters or peeling of the skin. Or, you might notice a red rash with swelling of the face, lips or lymph  nodes in your neck or under your arms. Patients and their families should watch out for new or worsening thoughts of suicide or depression. Also watch out for sudden changes in feelings such as feeling anxious, agitated, panicky, irritable, hostile, aggressive, impulsive, severely restless, overly excited and hyperactive, or not being able to sleep. If this happens, especially at the beginning of treatment or after a change in dose, call your healthcare provider. Avoid alcoholic drinks while taking this medicine. Drinking large amounts of alcoholic beverages, using sleeping or anxiety medicines, or quickly stopping the use of these agents while taking this medicine may increase your risk for a seizure. Do not drive or use heavy machinery until you know how this medicine affects you. This medicine can impair your ability to perform these tasks. Do not take this medicine close to bedtime. It may prevent you from sleeping. Your mouth may get dry. Chewing sugarless gum or sucking hard candy, and drinking plenty of water may help. Contact your doctor if the problem does not go away or is severe. The tablet shell for some brands of this medicine does not dissolve. This is normal. The tablet shell may appear whole in the stool. This is not a cause for concern. What side effects may I notice from receiving this medicine? Side effects that you should report to your doctor or health care professional as soon as possible:  allergic reactions like skin rash, itching or hives, swelling of the face, lips, or tongue  breathing problems  changes in vision  confusion  elevated mood, decreased need for sleep, racing thoughts, impulsive behavior  fast or irregular heartbeat  hallucinations, loss of contact with reality  increased blood pressure  rash, fever, and swollen lymph nodes  redness,  blistering, peeling or loosening of the skin, including inside the mouth  seizures  suicidal thoughts or other mood changes  unusually weak or tired  vomiting Side effects that usually do not require medical attention (report to your doctor or health care professional if they continue or are bothersome):  constipation  headache  loss of appetite  nausea  tremors  weight loss This list may not describe all possible side effects. Call your doctor for medical advice about side effects. You may report side effects to FDA at 1-800-FDA-1088. Where should I keep my medicine? Keep out of the reach of children. Store at room temperature between 15 and 30 degrees C (59 and 86 degrees F). Throw away any unused medicine after the expiration date. NOTE: This sheet is a summary. It may not cover all possible information. If you have questions about this medicine, talk to your doctor, pharmacist, or health care provider.  2021 Elsevier/Gold Standard (2018-08-14 13:45:31)

## 2020-09-13 ENCOUNTER — Other Ambulatory Visit: Payer: Self-pay

## 2020-09-13 ENCOUNTER — Ambulatory Visit (INDEPENDENT_AMBULATORY_CARE_PROVIDER_SITE_OTHER): Payer: 59 | Admitting: Obstetrics and Gynecology

## 2020-09-13 ENCOUNTER — Encounter: Payer: Self-pay | Admitting: Obstetrics and Gynecology

## 2020-09-13 VITALS — BP 123/79 | HR 87 | Ht 64.72 in | Wt 203.1 lb

## 2020-09-13 DIAGNOSIS — E785 Hyperlipidemia, unspecified: Secondary | ICD-10-CM

## 2020-09-13 DIAGNOSIS — Z124 Encounter for screening for malignant neoplasm of cervix: Secondary | ICD-10-CM | POA: Diagnosis not present

## 2020-09-13 DIAGNOSIS — Z79890 Hormone replacement therapy (postmenopausal): Secondary | ICD-10-CM | POA: Diagnosis not present

## 2020-09-13 DIAGNOSIS — E669 Obesity, unspecified: Secondary | ICD-10-CM

## 2020-09-13 DIAGNOSIS — E8940 Asymptomatic postprocedural ovarian failure: Secondary | ICD-10-CM

## 2020-09-13 DIAGNOSIS — Z01419 Encounter for gynecological examination (general) (routine) without abnormal findings: Secondary | ICD-10-CM | POA: Diagnosis not present

## 2020-09-13 MED ORDER — ESTRADIOL 0.1 MG/24HR TD PTTW
MEDICATED_PATCH | TRANSDERMAL | 11 refills | Status: DC
  Filled 2020-09-13: qty 24, 84d supply, fill #0
  Filled 2020-12-16: qty 8, 28d supply, fill #1

## 2020-09-13 MED ORDER — BUPROPION HCL ER (XL) 150 MG PO TB24
150.0000 mg | ORAL_TABLET | Freq: Every day | ORAL | 3 refills | Status: DC
  Filled 2020-09-13: qty 90, 90d supply, fill #0

## 2020-09-13 NOTE — Progress Notes (Signed)
Annual Exam-Pt stated that she was doing well.  

## 2020-09-13 NOTE — Progress Notes (Signed)
GYNECOLOGY ANNUAL PHYSICAL EXAM PROGRESS NOTE  Subjective:    Rebecca Evans is a 35 y.o. G34P1001 female who presents for an annual exam. She has a previous history of endometriosis, s/p LAVH with BSO in 2018.  The patient is sexually active.The patient wears seatbelts: yes. The patient participates in regular exercise: no. Has the patient ever been transfused or tattooed?: no.    The patient has the following complaints today:  1. Reports workup for persistent leukocytosis by Hematologist fairly recently, was negative.  2. Desires refill on hormonal medications.  3. Is attempting to lose weight, currently on Adipex, but is still noting some difficulty.   Gynecologic History  Menarche age:89 or 13 No LMP recorded (lmp unknown). Patient has had a hysterectomy. Contraception: status post hysterectomy History of STI's: Denies Last Pap: 02/12/2017. Results were: normal.  Denies h/o abnormal pap smears.   OB History  Gravida Para Term Preterm AB Living  2 1 1  0 0 1  SAB IAB Ectopic Multiple Live Births  0 0 0 0 1    # Outcome Date GA Lbr Len/2nd Weight Sex Delivery Anes PTL Lv  2 Term 05/07/13    F Vag-Spont   LIV  1 Gravida             Past Medical History:  Diagnosis Date  . Anxiety   . COVID-19    06/2020  . GERD (gastroesophageal reflux disease)   . History of kidney stones    h/o  . Leukocytosis 08/2020  . Lymphocytosis 08/2020  . Vitamin D deficiency     Past Surgical History:  Procedure Laterality Date  . ABDOMINAL HYSTERECTOMY     total Dr. Enzo Bi 05/13/17 no h/o abnormal pap for endometriosis and right ovarian cyst  . DIAGNOSTIC LAPAROSCOPY    . ESOPHAGOGASTRODUODENOSCOPY (EGD) WITH PROPOFOL N/A 06/19/2016   Procedure: ESOPHAGOGASTRODUODENOSCOPY (EGD) WITH PROPOFOL;  Surgeon: Lucilla Lame, MD;  Location: [facility];  Service: Endoscopy;  Laterality: N/A;  . LAPAROSCOPIC VAGINAL HYSTERECTOMY WITH SALPINGO OOPHORECTOMY Bilateral 05/13/2017    Procedure: LAPAROSCOPIC ASSISTED VAGINAL HYSTERECTOMY WITH BILATERAL SALPINGO OOPHORECTOMY;  Surgeon: Brayton Mars, MD;  Location: [facility];  Service: Gynecology;  Laterality: Bilateral;  . LAPROSCOPY    . REMOVAL OF DRUG DELIVERY IMPLANT Left 05/13/2017   Procedure: REMOVAL OF DRUG DELIVERY IMPLANT, LEFT ARM;  Surgeon: Brayton Mars, MD;  Location: [facility];  Service: Gynecology;  Laterality: Left;  . TONSILLECTOMY    . TONSILLECTOMY      Family History  Problem Relation Age of Onset  . Diabetes Maternal Grandmother   . Diabetes Maternal Grandfather   . Diabetes Mother        type 2  . Other Mother        abnormal mammogram 07/2020   . Diabetes Sister        type 2   . Asthma Daughter   . Diabetes Maternal Aunt        type 2  . Asthma Daughter   . Diabetes Maternal Aunt        type 2    Social History   Socioeconomic History  . Marital status: Married    Spouse name: Not on file  . Number of children: Not on file  . Years of education: Not on file  . Highest education level: Not on file  Occupational History  . Not on file  Tobacco Use  . Smoking status: Never Smoker  . Smokeless tobacco: Never  Used  Vaping Use  . Vaping Use: Never used  Substance and Sexual Activity  . Alcohol use: No  . Drug use: No  . Sexual activity: Yes    Birth control/protection: None, Surgical  Other Topics Concern  . 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 Biotin 10000 MCG TABS Take by mouth daily.    . calcium-vitamin D  (OSCAL WITH D) 500-200 MG-UNIT tablet Take 1 tablet by mouth.    . escitalopram (LEXAPRO) 10 MG tablet TAKE 1 TABLET (10 MG TOTAL) BY MOUTH DAILY. 90 tablet 3  . lansoprazole (PREVACID) 30 MG capsule TAKE 1 CAPSULE BY MOUTH AT BEDTIME 30 MINUTES BEFORE FOOD 90 capsule 3  . phentermine (ADIPEX-P) 37.5 MG tablet Take 1 tablet (37.5 mg total) by mouth daily before breakfast. 60 tablet 0  . phentermine (ADIPEX-P) 37.5 MG tablet Take 1 tablet (37.5 mg total) by mouth daily before breakfast. (Patient not taking: Reported on 09/13/2020) 60 tablet 0   No current facility-administered medications on file prior to visit.    No Known Allergies   Review of Systems Constitutional: negative for chills, fatigue, fevers and sweats Eyes: negative for irritation, redness and visual disturbance Ears, nose, mouth, throat, and face: negative for hearing loss, nasal congestion, snoring and tinnitus Respiratory: negative for asthma, cough, sputum Cardiovascular: negative for chest pain, dyspnea, exertional chest pressure/discomfort, irregular heart beat, palpitations and syncope Gastrointestinal: negative for abdominal pain, change in bowel habits, nausea and vomiting Genitourinary: negative for abnormal menstrual periods, genital lesions, sexual problems and vaginal discharge, dysuria and urinary incontinence Integument/breast: negative for breast lump, breast tenderness and nipple discharge Hematologic/lymphatic: negative for bleeding and easy bruising Musculoskeletal:negative for back pain and muscle weakness Neurological: negative for dizziness, headaches, vertigo and weakness Endocrine: negative for diabetic symptoms including polydipsia, polyuria and skin dryness Allergic/Immunologic: negative for hay fever and urticaria       Objective:  Blood pressure 123/79, pulse 87, height 5' 4.72" (1.644 m), weight 203 lb 1.6 oz (92.1 kg). Body mass index is 34.09 kg/m.  General Appearance:    Alert, cooperative,  no distress, appears stated age, mild obesity  Head:    Normocephalic, without obvious abnormality, atraumatic  Eyes:    PERRL, conjunctiva/corneas clear, EOM's intact, both eyes  Ears:    Normal external ear canals, both ears  Nose:   Nares normal, septum midline, mucosa normal, no drainage or sinus tenderness  Throat:   Lips, mucosa, and tongue normal; teeth and gums normal  Neck:   Supple, symmetrical, trachea midline, no adenopathy; thyroid: no enlargement/tenderness/nodules; no carotid bruit or JVD  Back:     Symmetric, no curvature, ROM normal, no CVA tenderness  Lungs:     Clear to auscultation bilaterally, respirations unlabored  Chest Wall:    No tenderness or deformity   Heart:    Regular rate and rhythm, S1 and S2 normal, no murmur, rub or gallop  Breast Exam:    No tenderness, masses, or nipple abnormality  Abdomen:     Soft, non-tender, bowel sounds active all four quadrants, no masses, no organomegaly.    Genitalia:    Pelvic:external genitalia normal, vagina without lesions, discharge, or tenderness, rectovaginal septum  normal. Cervix normal in appearance, no cervical motion tenderness, no adnexal masses or tenderness.  Uterus normal size, shape, mobile, regular contours, nontender.  Rectal:    Normal external sphincter.  No hemorrhoids appreciated. Internal exam not done.   Extremities:   Extremities normal, atraumatic, no cyanosis or edema  Pulses:   2+ and symmetric all extremities  Skin:   Skin color, texture, turgor normal, no rashes or lesions  Lymph nodes:   Cervical, supraclavicular, and axillary nodes normal  Neurologic:   CNII-XII intact, normal strength, sensation and reflexes throughout    GAD 7 : Generalized Anxiety Score 07/13/2020  Nervous, Anxious, on Edge 0  Control/stop worrying 0  Worry too much - different things 0  Trouble relaxing 0  Restless 0  Easily annoyed or irritable 0  Afraid - awful might happen 0  Total GAD 7 Score 0  Anxiety Difficulty Not  difficult at all     Labs:  Lab Results  Component Value Date   WBC 11.1 (H) 07/25/2020   HGB 13.5 07/25/2020   HCT 40.1 07/25/2020   MCV 86.6 07/25/2020   PLT 346 07/25/2020    Lab Results  Component Value Date   CREATININE 0.58 07/13/2020   BUN 10 07/13/2020   NA 138 07/13/2020   K 4.1 07/13/2020   CL 102 07/13/2020   CO2 29 07/13/2020    Lab Results  Component Value Date   ALT 22 07/13/2020   AST 18 07/13/2020   ALKPHOS 114 07/13/2020   BILITOT 0.4 07/13/2020     Lab Results  Component Value Date   TSH 0.54 07/13/2020    Lab Results  Component Value Date   CHOL 232 (H) 07/13/2020   HDL 47.90 07/13/2020   LDLCALC 149 (H) 07/13/2020   TRIG 176.0 (H) 07/13/2020   CHOLHDL 5 07/13/2020     Assessment:   1. Encounter for well woman exam with routine gynecological exam   2. Pap smear for cervical cancer screening   3. Surgical menopause on hormone replacement therapy   4. Obesity (BMI 30.0-34.9)   5. Hyperlipidemia, unspecified hyperlipidemia type    Plan:    - Blood tests: performed by PCP, reviewed.  - Breast self exam technique reviewed and patient encouraged to perform self-exam monthly. - Discussed healthy lifestyle modifications. - Pap smear no longer needed, is s/p hysterectomy. - Refill given on HRT for surgical menopause.  - Hyperlipidemia, to be managed by PCP. - Currently on Phentermine for weight management. - Anxiety, currently on Lexapro. May be casue of issues with no weight loss. Changed to Wellbutrin to help with anxiety and weight. To wean from Lexapro.  Follow up in 1 year for annual exam.     Rubie Maid, MD [facility]

## 2020-09-19 ENCOUNTER — Other Ambulatory Visit: Payer: Self-pay

## 2020-09-29 ENCOUNTER — Other Ambulatory Visit: Payer: Self-pay

## 2020-10-11 ENCOUNTER — Encounter: Payer: 59 | Admitting: Obstetrics and Gynecology

## 2020-11-18 DIAGNOSIS — J329 Chronic sinusitis, unspecified: Secondary | ICD-10-CM | POA: Diagnosis not present

## 2020-11-18 DIAGNOSIS — J40 Bronchitis, not specified as acute or chronic: Secondary | ICD-10-CM | POA: Diagnosis not present

## 2020-11-18 DIAGNOSIS — B9689 Other specified bacterial agents as the cause of diseases classified elsewhere: Secondary | ICD-10-CM | POA: Diagnosis not present

## 2020-11-18 DIAGNOSIS — Z03818 Encounter for observation for suspected exposure to other biological agents ruled out: Secondary | ICD-10-CM | POA: Diagnosis not present

## 2020-11-30 IMAGING — CR DG FOOT COMPLETE 3+V*L*
1 series · 3 of 3 positions shown · non-contrast
Comparison: None.

CLINICAL DATA: Left foot pain, difficulty weight-bearing

EXAM:
LEFT FOOT - COMPLETE 3+ VIEW

[Series 1: dg foot complete left · 0.14mm/px · 3 of 3 slices shown]
[im 1/3]
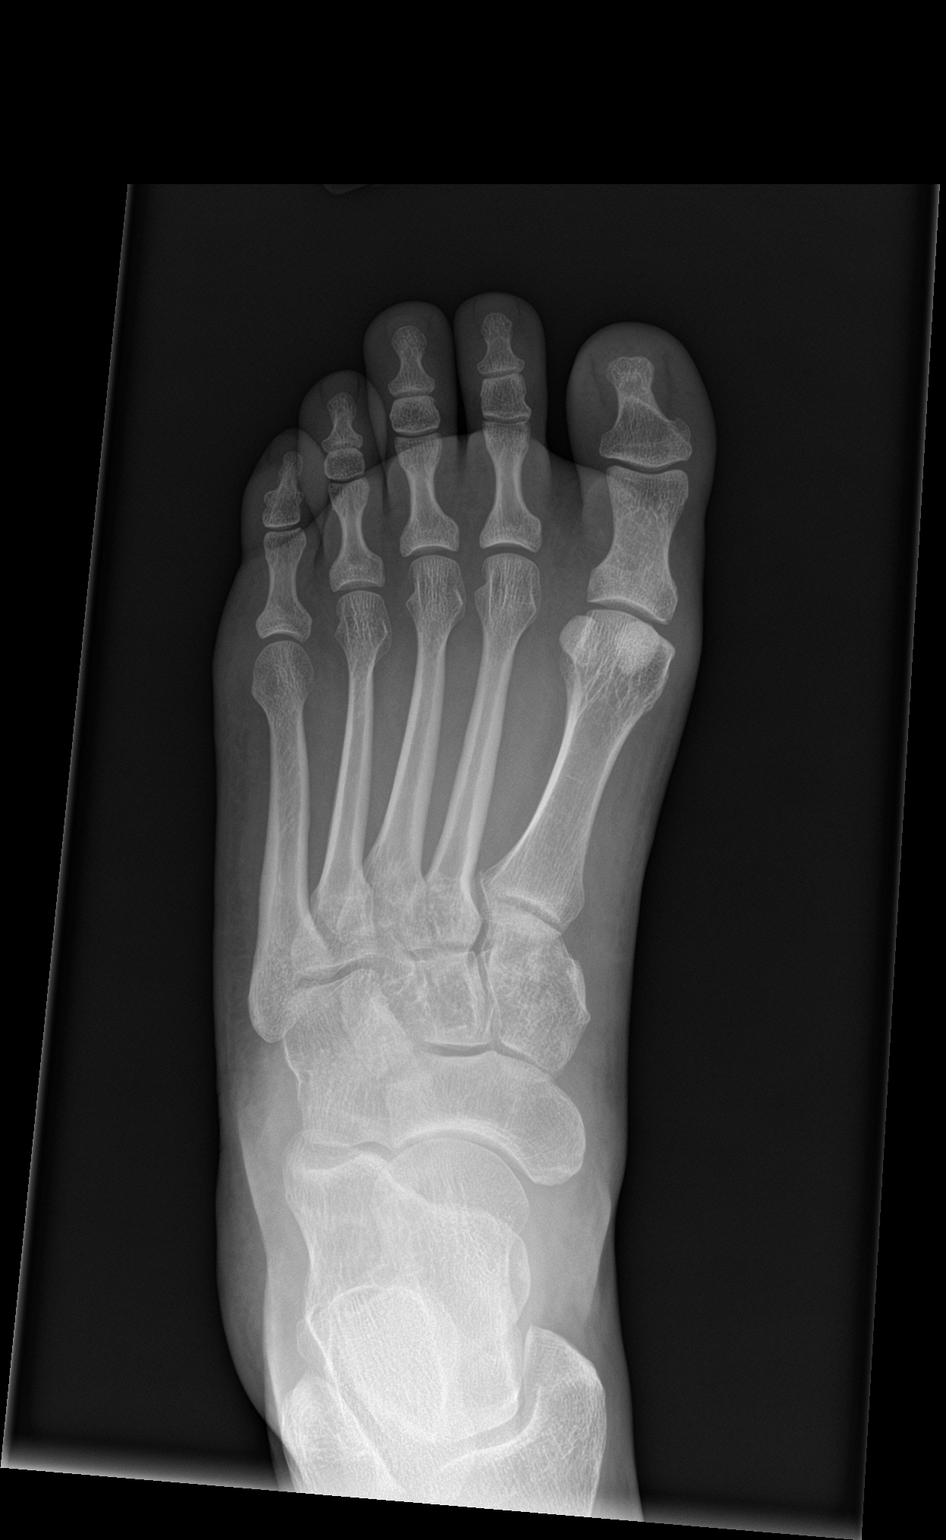
[im 2/3]
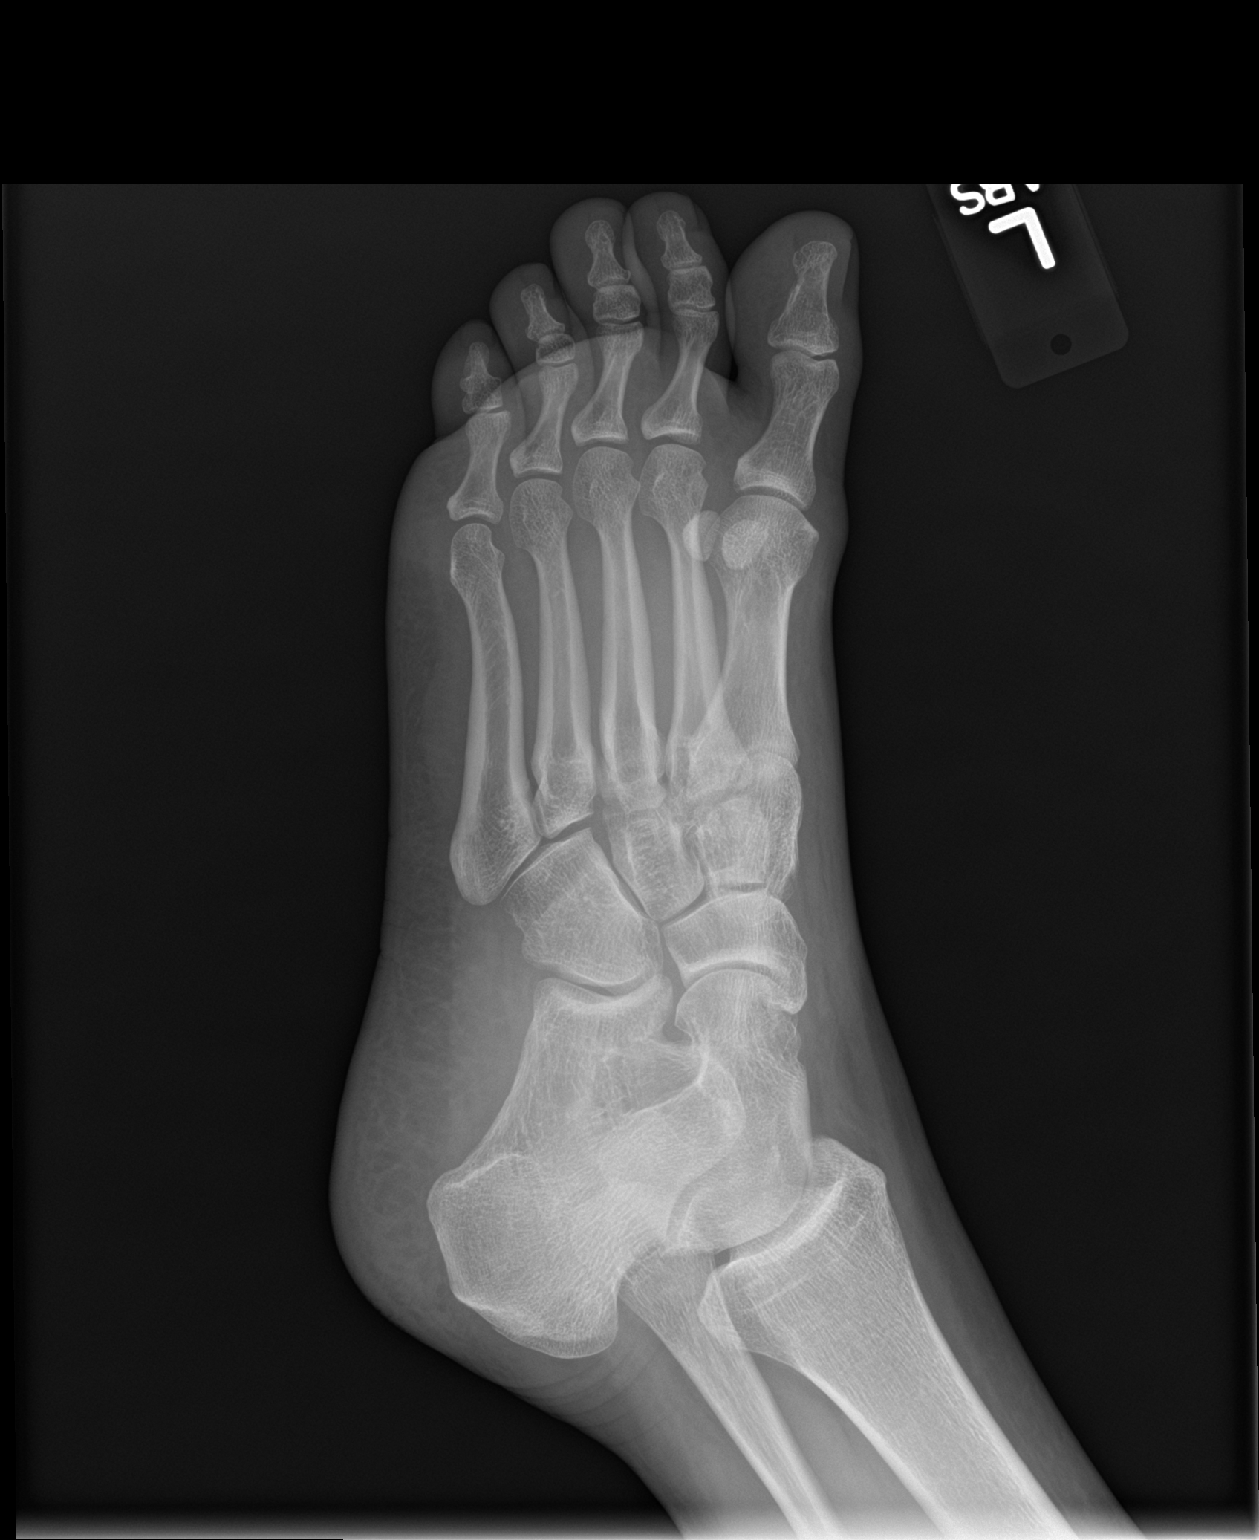
[im 3/3]
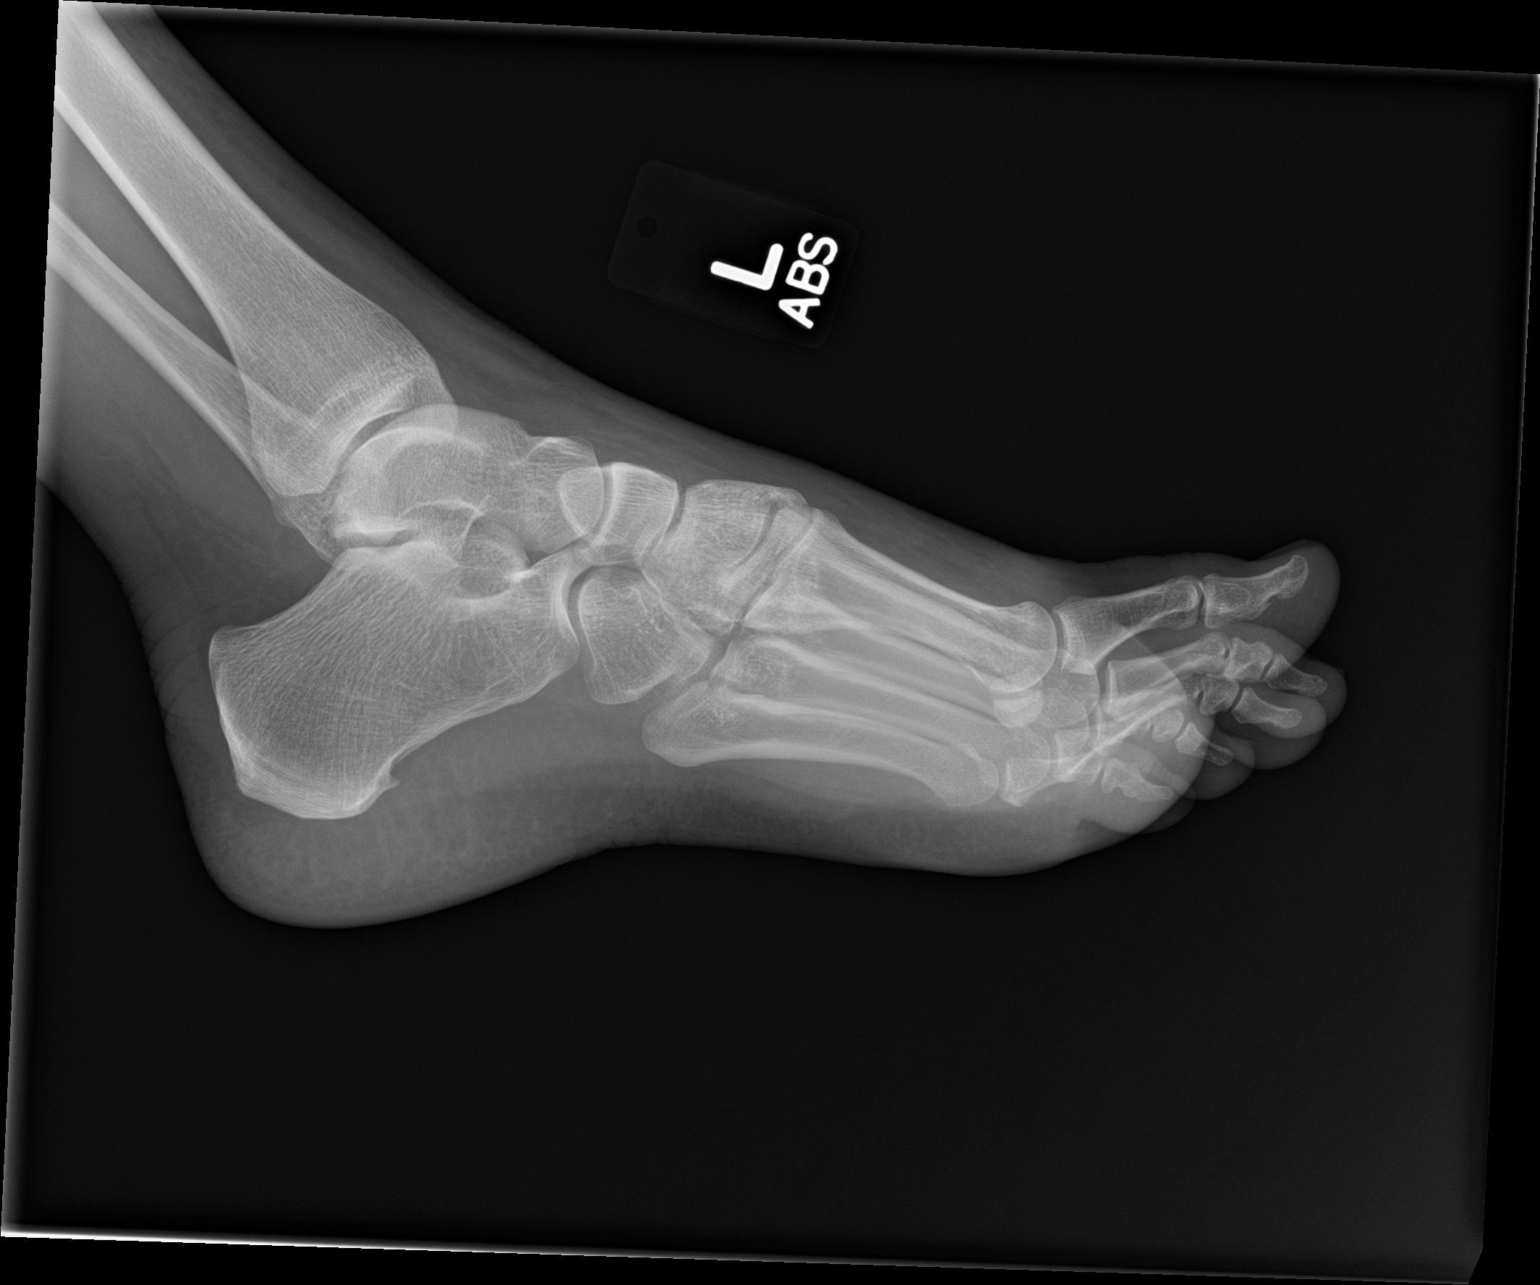

[3 of 3 positions shown; findings below may reference images not displayed]

FINDINGS: There is no evidence of fracture or dislocation. There is no
evidence of arthropathy or other focal bone abnormality. Soft
tissues are unremarkable.
IMPRESSION: Negative.

## 2020-12-16 ENCOUNTER — Other Ambulatory Visit: Payer: Self-pay

## 2020-12-16 MED FILL — Lansoprazole Cap Delayed Release 30 MG: ORAL | 90 days supply | Qty: 90 | Fill #0 | Status: AC

## 2021-03-10 DIAGNOSIS — N898 Other specified noninflammatory disorders of vagina: Secondary | ICD-10-CM | POA: Diagnosis not present

## 2021-03-10 DIAGNOSIS — R350 Frequency of micturition: Secondary | ICD-10-CM | POA: Diagnosis not present

## 2021-04-21 DIAGNOSIS — Z20822 Contact with and (suspected) exposure to covid-19: Secondary | ICD-10-CM | POA: Diagnosis not present

## 2021-04-23 DIAGNOSIS — Z20822 Contact with and (suspected) exposure to covid-19: Secondary | ICD-10-CM | POA: Diagnosis not present

## 2021-04-25 ENCOUNTER — Encounter: Payer: Self-pay | Admitting: Internal Medicine

## 2021-07-18 ENCOUNTER — Encounter: Payer: 59 | Admitting: Internal Medicine

## 2021-07-26 ENCOUNTER — Other Ambulatory Visit: Payer: Self-pay

## 2021-07-26 ENCOUNTER — Ambulatory Visit (INDEPENDENT_AMBULATORY_CARE_PROVIDER_SITE_OTHER): Payer: BC Managed Care – PPO | Admitting: Internal Medicine

## 2021-07-26 ENCOUNTER — Other Ambulatory Visit: Payer: Self-pay | Admitting: Internal Medicine

## 2021-07-26 ENCOUNTER — Encounter: Payer: Self-pay | Admitting: Internal Medicine

## 2021-07-26 VITALS — BP 110/70 | HR 78 | Temp 97.8°F | Ht 63.98 in | Wt 191.2 lb

## 2021-07-26 DIAGNOSIS — E559 Vitamin D deficiency, unspecified: Secondary | ICD-10-CM | POA: Diagnosis not present

## 2021-07-26 DIAGNOSIS — R768 Other specified abnormal immunological findings in serum: Secondary | ICD-10-CM

## 2021-07-26 DIAGNOSIS — R700 Elevated erythrocyte sedimentation rate: Secondary | ICD-10-CM

## 2021-07-26 DIAGNOSIS — Z0000 Encounter for general adult medical examination without abnormal findings: Secondary | ICD-10-CM

## 2021-07-26 DIAGNOSIS — F419 Anxiety disorder, unspecified: Secondary | ICD-10-CM | POA: Diagnosis not present

## 2021-07-26 DIAGNOSIS — Z1329 Encounter for screening for other suspected endocrine disorder: Secondary | ICD-10-CM | POA: Diagnosis not present

## 2021-07-26 DIAGNOSIS — Z13220 Encounter for screening for lipoid disorders: Secondary | ICD-10-CM

## 2021-07-26 DIAGNOSIS — R7303 Prediabetes: Secondary | ICD-10-CM

## 2021-07-26 DIAGNOSIS — K219 Gastro-esophageal reflux disease without esophagitis: Secondary | ICD-10-CM

## 2021-07-26 DIAGNOSIS — Z1389 Encounter for screening for other disorder: Secondary | ICD-10-CM

## 2021-07-26 DIAGNOSIS — R110 Nausea: Secondary | ICD-10-CM

## 2021-07-26 DIAGNOSIS — M2550 Pain in unspecified joint: Secondary | ICD-10-CM

## 2021-07-26 DIAGNOSIS — E538 Deficiency of other specified B group vitamins: Secondary | ICD-10-CM | POA: Diagnosis not present

## 2021-07-26 DIAGNOSIS — D72829 Elevated white blood cell count, unspecified: Secondary | ICD-10-CM

## 2021-07-26 DIAGNOSIS — E611 Iron deficiency: Secondary | ICD-10-CM

## 2021-07-26 DIAGNOSIS — D72820 Lymphocytosis (symptomatic): Secondary | ICD-10-CM

## 2021-07-26 DIAGNOSIS — E669 Obesity, unspecified: Secondary | ICD-10-CM

## 2021-07-26 DIAGNOSIS — R748 Abnormal levels of other serum enzymes: Secondary | ICD-10-CM

## 2021-07-26 DIAGNOSIS — F429 Obsessive-compulsive disorder, unspecified: Secondary | ICD-10-CM

## 2021-07-26 LAB — COMPREHENSIVE METABOLIC PANEL
ALT: 23 U/L (ref 0–35)
AST: 17 U/L (ref 0–37)
Albumin: 4.2 g/dL (ref 3.5–5.2)
Alkaline Phosphatase: 122 U/L — ABNORMAL HIGH (ref 39–117)
BUN: 11 mg/dL (ref 6–23)
CO2: 33 mEq/L — ABNORMAL HIGH (ref 19–32)
Calcium: 9.4 mg/dL (ref 8.4–10.5)
Chloride: 102 mEq/L (ref 96–112)
Creatinine, Ser: 0.63 mg/dL (ref 0.40–1.20)
GFR: 115.4 mL/min (ref >60.00)
Glucose, Bld: 89 mg/dL (ref 70–99)
Potassium: 4.2 mEq/L (ref 3.5–5.1)
Sodium: 139 mEq/L (ref 135–145)
Total Bilirubin: 0.5 mg/dL (ref 0.2–1.2)
Total Protein: 7.4 g/dL (ref 6.0–8.3)

## 2021-07-26 LAB — LIPID PANEL
Cholesterol: 235 mg/dL — ABNORMAL HIGH (ref 0–200)
HDL: 46 mg/dL (ref >39.00)
LDL Cholesterol: 156 mg/dL — ABNORMAL HIGH (ref 0–99)
NonHDL: 188.72
Total CHOL/HDL Ratio: 5
Triglycerides: 166 mg/dL — ABNORMAL HIGH (ref 0.0–149.0)
VLDL: 33.2 mg/dL (ref 0.0–40.0)

## 2021-07-26 LAB — CBC WITH DIFFERENTIAL/PLATELET
Basophils Absolute: 0.1 10*3/uL (ref 0.0–0.1)
Basophils Relative: 0.5 % (ref 0.0–3.0)
Eosinophils Absolute: 0.2 10*3/uL (ref 0.0–0.7)
Eosinophils Relative: 1.9 % (ref 0.0–5.0)
HCT: 41.2 % (ref 36.0–46.0)
Hemoglobin: 13.6 g/dL (ref 12.0–15.0)
Lymphocytes Relative: 38.5 % (ref 12.0–46.0)
Lymphs Abs: 4.6 10*3/uL — ABNORMAL HIGH (ref 0.7–4.0)
MCHC: 32.9 g/dL (ref 30.0–36.0)
MCV: 86.3 fl (ref 78.0–100.0)
Monocytes Absolute: 0.4 10*3/uL (ref 0.1–1.0)
Monocytes Relative: 3.4 % (ref 3.0–12.0)
Neutro Abs: 6.6 10*3/uL (ref 1.4–7.7)
Neutrophils Relative %: 55.7 % (ref 43.0–77.0)
Platelets: 359 10*3/uL (ref 150.0–400.0)
RBC: 4.78 Mil/uL (ref 3.87–5.11)
RDW: 13.6 % (ref 11.5–15.5)
WBC: 11.9 10*3/uL — ABNORMAL HIGH (ref 4.0–10.5)

## 2021-07-26 LAB — TSH: TSH: 0.66 u[IU]/mL (ref 0.35–5.50)

## 2021-07-26 LAB — HEMOGLOBIN A1C: Hgb A1c MFr Bld: 5.6 % (ref 4.6–6.5)

## 2021-07-26 LAB — VITAMIN B12: Vitamin B-12: 705 pg/mL (ref 211–911)

## 2021-07-26 LAB — T4, FREE: Free T4: 0.95 ng/dL (ref 0.60–1.60)

## 2021-07-26 LAB — VITAMIN D 25 HYDROXY (VIT D DEFICIENCY, FRACTURES): VITD: 48.31 ng/mL (ref 30.00–100.00)

## 2021-07-26 MED ORDER — PHENTERMINE HCL 37.5 MG PO TABS: 37.5000 mg | ORAL_TABLET | Freq: Every day | ORAL | 0 refills | Status: DC

## 2021-07-26 MED ORDER — LANSOPRAZOLE 30 MG PO CPDR: DELAYED_RELEASE_CAPSULE | ORAL | 3 refills | Status: DC

## 2021-07-26 MED ORDER — ESCITALOPRAM OXALATE 10 MG PO TABS: ORAL_TABLET | Freq: Every day | ORAL | 3 refills | Status: DC

## 2021-07-26 NOTE — Patient Instructions (Addendum)
Call  dermatology for patch test   Therapy  [facility] as given the info before for both  [facility]  [place] [address] [phone]    [facility]  [address]  [place] [address]  [phone]   Hives Hives (urticaria) are itchy, red, swollen areas on the skin. Hives can appear on any part of the body. Hives often fade within 24 hours (acute hives). Sometimes, new hives appear after old ones fade and the cycle can continue for several days or weeks (chronic hives). Hives do not spread from person to person (are not contagious). Hives come from the body's reaction to something a person is allergic to (allergen), something that causes irritation, or various other triggers. When a person is exposed to a trigger, his or her body releases a chemical (histamine) that causes redness, itching, and swelling. Hives can appear right after exposure to a trigger or hours later. What are the causes? This condition may be caused by: Allergies to foods or ingredients. Insect bites or stings. Exposure to pollen or pets. Spending time in sunlight, heat, or cold (exposure). Exercise. Stress. You can also get hives from other medical conditions and treatments, such as: Viruses, including the common cold. Bacterial infections, such as urinary tract infections and strep throat. Certain medicines. Contact with latex or chemicals. Allergy shots. Blood transfusions. Sometimes, the cause of this condition is not known (idiopathic hives). What increases the risk? You are more likely to develop this condition if you: Are a woman. Have food allergies, especially to citrus fruits, milk, eggs, peanuts, tree nuts, or shellfish. Are allergic to: Medicines. Latex. Insects. Animals. Pollen. What are the signs or symptoms? Common symptoms of this condition include raised, itchy, red  or white bumps or patches on your skin. These areas may: Become large and swollen (welts). Change in shape and location, quickly and repeatedly. Be separate hives or connect over a large area of skin. Sting or become painful. Turn white when pressed in the center (blanch). In severe cases, your hands, feet, and face may also become swollen. This may occur if hives develop deeper in your skin. How is this diagnosed? This condition may be diagnosed by your symptoms, medical history, and physical exam. Your skin, urine, or blood may be tested to find out what is causing your hives and to rule out other health issues. Your health care provider may also remove a small sample of skin from the affected area and examine it under a microscope (biopsy). How is this treated? Treatment for this condition depends on the cause and severity of your symptoms. Your health care provider may recommend using cool, wet cloths (cool compresses) or taking cool showers to relieve itching. Treatment may include: Medicines that help: Relieve itching (antihistamines). Reduce swelling (corticosteroids). Treat infection (antibiotics). An injectable medicine (omalizumab). Your health care provider may prescribe this if you have chronic idiopathic hives and you continue to have symptoms even after treatment with antihistamines. Severe cases may require an emergency injection of adrenaline (epinephrine) to prevent a life-threatening allergic reaction (anaphylaxis). Follow these instructions at home: Medicines Take and apply over-the-counter and prescription medicines only as told by your health care provider. If you were prescribed an antibiotic medicine, take it as told by your health care provider. Do not stop using the antibiotic even if you start to feel better. Skin care Apply cool compresses to the affected areas.  Do not scratch or rub your skin. General instructions Do not take hot showers or baths. This can make  itching worse. Do not wear tight-fitting clothing. Use sunscreen and wear protective clothing when you are outside. Avoid any substances that cause your hives. Keep a journal to help track what causes your hives. Write down: What medicines you take. What you eat and drink. What products you use on your skin. Keep all follow-up visits as told by your health care provider. This is important. Contact a health care provider if: Your symptoms are not controlled with medicine. Your joints are painful or swollen. Get help right away if: You have a fever. You have pain in your abdomen. Your tongue or lips are swollen. Your eyelids are swollen. Your chest or throat feels tight. You have trouble breathing or swallowing. These symptoms may represent a serious problem that is an emergency. Do not wait to see if the symptoms will go away. Get medical help right away. Call your local emergency services (911 in the [place] Do not drive yourself to the hospital. Summary Hives (urticaria) are itchy, red, swollen areas on your skin. Hives come from the body's reaction to something a person is allergic to (allergen), something that causes irritation, or various other triggers. Treatment for this condition depends on the cause and severity of your symptoms. Avoid any substances that cause your hives. Keep a journal to help track what causes your hives. Take and apply over-the-counter and prescription medicines only as told by your health care provider. Get help right away if your chest or throat feels tight or if you have trouble breathing or swallowing. This information is not intended to replace advice given to you by your health care provider. Make sure you discuss any questions you have with your health care provider. Document Revised: 07/10/2020 Document Reviewed: 07/10/2020 Elsevier Patient Education  2022 [place].  Mindfulness-Based Stress Reduction Mindfulness-based stress reduction (MBSR) is  a program that helps people learn to practice mindfulness. Mindfulness is the practice of consciously paying attention to the present moment. MBSR focuses on developing self-awareness, which lets you respond to life stress without judgment or negative feelings. It can be learned and practiced through techniques such as education, breathing exercises, meditation, and yoga. MBSR includes several mindfulness techniques in one program. MBSR works best when you understand the treatment, are willing to try new things, and can commit to spending time practicing what you learn. MBSR training may include learning about: How your feelings, thoughts, and reactions affect your body. New ways to respond to things that cause negative thoughts to start (triggers). How to notice your thoughts and let go of them. Practicing awareness of everyday things that you normally do without thinking. The techniques and goals of different types of meditation. What are the benefits of MBSR? MBSR can have many benefits, which include helping you to: Develop self-awareness. This means knowing and understanding yourself. Learn skills and attitudes that help you to take part in your own health care. Learn new ways to care for yourself. Be more accepting about how things are, and let things go. Be less judgmental and approach things with an open mind. Be patient with yourself and trust yourself more. MBSR has also been shown to: Reduce negative emotions, such as sadness, overwhelm, and worry. Improve memory and focus. Change how you sense and react to pain. Boost your body's ability to fight infections. Help you connect better with other people. Improve your sense of well-being. How  to practice mindfulness To do a basic awareness exercise: Find a comfortable place to sit. Pay attention to the present moment. Notice your thoughts, feelings, and surroundings just as they are. Avoid judging yourself, your feelings, or your  surroundings. Make note of any judgment that comes up and let it go. Your mind may wander, and that is okay. Make note of when your thoughts drift, and return your attention to the present moment. To do basic mindfulness meditation: Find a comfortable place to sit. This may include a stable chair or a firm floor cushion. Sit upright with your back straight. Let your arms fall next to your sides, with your hands resting on your legs. If you are sitting in a chair, rest your feet flat on the floor. If you are sitting on a cushion, cross your legs in front of you. Keep your head in a neutral position with your chin dropped slightly. Relax your jaw and rest the tip of your tongue on the roof of your mouth. Drop your gaze to the floor or close your eyes. Breathe normally and pay attention to your breath. Feel the air moving in and out of your nose. Feel your belly expanding and relaxing with each breath. Your mind may wander, and that is okay. Make note of when your thoughts drift, and return your attention to your breath. Avoid judging yourself, your feelings, or your surroundings. Make note of any judgment or feelings that come up, let them go, and bring your attention back to your breath. When you are ready, lift your gaze or open your eyes. Pay attention to how your body feels after the meditation. Follow these instructions at home:  Find a local in-person or online MBSR program. Set aside some time regularly for mindfulness practice. Practice every day if you can. Even 10 minutes of practice is helpful. Find a mindfulness practice that works best for you. This may include one or more of the following: Meditation. This involves focusing your mind on a certain thought or activity. Breathing awareness exercises. These help you to stay present by focusing on your breath. Body scan. For this practice, you lie down and pay attention to each part of your body from head to toe. You can identify tension  and soreness and consciously relax parts of your body. Yoga. Yoga involves stretching and breathing, and it can improve your ability to move and be flexible. It can also help you to test your body's limits, which can help you release stress. Mindful eating. This way of eating involves focusing on the taste, texture, color, and smell of each bite of food. This slows down eating and helps you feel full sooner. For this reason, it can be an important part of a weight loss plan. Find a podcast or recording that provides guidance for breathing awareness, body scan, or meditation exercises. You can listen to these any time when you have a free moment to rest without distractions. Follow your treatment plan as told by your health care provider. This may include taking regular medicines and making changes to your diet or lifestyle as recommended. Where to find more information You can find more information about MBSR from: Your health care provider. Community-based meditation centers or programs. Programs offered near you. Summary Mindfulness-based stress reduction (MBSR) is a program that teaches you how to consciously pay attention to the present moment. It is used to help you deal better with daily stress, feelings, and pain. MBSR focuses on developing self-awareness, which  allows you to respond to life stress without judgment or negative feelings. MBSR programs may involve learning different mindfulness practices, such as breathing exercises, meditation, yoga, body scan, or mindful eating. Find a mindfulness practice that works best for you, and set aside time for it on a regular basis. This information is not intended to replace advice given to you by your health care provider. Make sure you discuss any questions you have with your health care provider. Document Revised: 12/29/2020 Document Reviewed: 12/29/2020 Elsevier Patient Education  2022 [place], Adult After being  diagnosed with anxiety, you may be relieved to know why you have felt or behaved a certain way. You may also feel overwhelmed about the treatment ahead and what it will mean for your life. With care and support, you can manage this condition. How to manage lifestyle changes Managing stress and anxiety Stress is your body's reaction to life changes and events, both good and bad. Most stress will last just a few hours, but stress can be ongoing and can lead to more than just stress. Although stress can play a major role in anxiety, it is not the same as anxiety. Stress is usually caused by something external, such as a deadline, test, or competition. Stress normally passes after the triggering event has ended.  Anxiety is caused by something internal, such as imagining a terrible outcome or worrying that something will go wrong that will devastate you. Anxiety often does not go away even after the triggering event is over, and it can become long-term (chronic) worry. It is important to understand the differences between stress and anxiety and to manage your stress effectively so that it does not lead to an anxious response. Talk with your health care provider or a counselor to learn more about reducing anxiety and stress. He or she may suggest tension reduction techniques, such as: Music therapy. Spend time creating or listening to music that you enjoy and that inspires you. Mindfulness-based meditation. Practice being aware of your normal breaths while not trying to control your breathing. It can be done while sitting or walking. Centering prayer. This involves focusing on a word, phrase, or sacred image that means something to you and brings you peace. Deep breathing. To do this, expand your stomach and inhale slowly through your nose. Hold your breath for 3-5 seconds. Then exhale slowly, letting your stomach muscles relax. Self-talk. Learn to notice and identify thought patterns that lead to anxiety  reactions and change those patterns to thoughts that feel peaceful. Muscle relaxation. Taking time to tense muscles and then relax them. Choose a tension reduction technique that fits your lifestyle and personality. These techniques take time and practice. Set aside 5-15 minutes a day to do them. Therapists can offer counseling and training in these techniques. The training to help with anxiety may be covered by some insurance plans. Other things you can do to manage stress and anxiety include: Keeping a stress diary. This can help you learn what triggers your reaction and then learn ways to manage your response. Thinking about how you react to certain situations. You may not be able to control everything, but you can control your response. Making time for activities that help you relax and not feeling guilty about spending your time in this way. Doing visual imagery. This involves imagining or creating mental pictures to help you relax. Practicing yoga. Through yoga poses, you can lower tension and promote relaxation.  Medicines Medicines can help ease  symptoms. Medicines for anxiety include: Antidepressant medicines. These are usually prescribed for long-term daily control. Anti-anxiety medicines. These may be added in severe cases, especially when panic attacks occur. Medicines will be prescribed by a health care provider. When used together, medicines, psychotherapy, and tension reduction techniques may be the most effective treatment. Relationships Relationships can play a big part in helping you recover. Try to spend more time connecting with trusted friends and family members. Consider going to couples counseling if you have a partner, taking family education classes, or going to family therapy. Therapy can help you and others better understand your condition. How to recognize changes in your anxiety Everyone responds differently to treatment for anxiety. Recovery from anxiety happens when  symptoms decrease and stop interfering with your daily activities at home or work. This may mean that you will start to: Have better concentration and focus. Worry will interfere less in your daily thinking. Sleep better. Be less irritable. Have more energy. Have improved memory. It is also important to recognize when your condition is getting worse. Contact your health care provider if your symptoms interfere with home or work and you feel like your condition is not improving. Follow these instructions at home: Activity Exercise. Adults should do the following: Exercise for at least 150 minutes each week. The exercise should increase your heart rate and make you sweat (moderate-intensity exercise). Strengthening exercises at least twice a week. Get the right amount and quality of sleep. Most adults need 7-9 hours of sleep each night. Lifestyle  Eat a healthy diet that includes plenty of vegetables, fruits, whole grains, low-fat dairy products, and lean protein. Do not eat a lot of foods that are high in fats, added sugars, or salt (sodium). Make choices that simplify your life. Do not use any products that contain nicotine or tobacco. These products include cigarettes, chewing tobacco, and vaping devices, such as e-cigarettes. If you need help quitting, ask your health care provider. Avoid caffeine, alcohol, and certain over-the-counter cold medicines. These may make you feel worse. Ask your pharmacist which medicines to avoid. General instructions Take over-the-counter and prescription medicines only as told by your health care provider. Keep all follow-up visits. This is important. Where to find support You can get help and support from these sources: Self-help groups. Online and [employer]. A trusted spiritual leader. Couples counseling. Family education classes. Family therapy. Where to find more information You may find that joining a support group helps you deal  with your anxiety. The following sources can help you locate counselors or support groups near you: [place]: www.mentalhealthamerica.net Anxiety and Depression Association of [place] (ADAA): https://www.clark.net/ National Alliance on Mental Illness (NAMI): www.nami.org Contact a health care provider if: You have a hard time staying focused or finishing daily tasks. You spend many hours a day feeling worried about everyday life. You become exhausted by worry. You start to have headaches or frequently feel tense. You develop chronic nausea or diarrhea. Get help right away if: You have a racing heart and shortness of breath. You have thoughts of hurting yourself or others. If you ever feel like you may hurt yourself or others, or have thoughts about taking your own life, get help right away. Go to your nearest emergency department or: Call your local emergency services (911 in the [place] Call a suicide crisis helpline, such as the [facility] at (985)740-1920 or 988 in the [place]. This is open 24 hours a day in the [place] Text  the Crisis Text Line at (928) 763-0717 (in the [place].). Summary Taking steps to learn and use tension reduction techniques can help calm you and help prevent triggering an anxiety reaction. When used together, medicines, psychotherapy, and tension reduction techniques may be the most effective treatment. Family, friends, and partners can play a big part in supporting you. This information is not intended to replace advice given to you by your health care provider. Make sure you discuss any questions you have with your health care provider. Document Revised: 12/14/2020 Document Reviewed: 09/11/2020 Elsevier Patient Education  [place].  Generalized Anxiety Disorder, Adult Generalized anxiety disorder (GAD) is a mental health condition. Unlike normal worries, anxiety related to GAD is not triggered by a specific event. These worries do not fade or  get better with time. GAD interferes with relationships, work, and school. GAD symptoms can vary from mild to severe. People with severe GAD can have intense waves of anxiety with physical symptoms that are similar to panic attacks. What are the causes? The exact cause of GAD is not known, but the following are believed to have an impact: Differences in natural brain chemicals. Genes passed down from parents to children. Differences in the way threats are perceived. Development and stress during childhood. Personality. What increases the risk? The following factors may make you more likely to develop this condition: Being female. Having a family history of anxiety disorders. Being very shy. Experiencing very stressful life events, such as the death of a loved one. Having a very stressful family environment. What are the signs or symptoms? People with GAD often worry excessively about many things in their lives, such as their health and family. Symptoms may also include: Mental and emotional symptoms: Worrying excessively about natural disasters. Fear of being late. Difficulty concentrating. Fears that others are judging your performance. Physical symptoms: Fatigue. Headaches, muscle tension, muscle twitches, trembling, or feeling shaky. Feeling like your heart is pounding or beating very fast. Feeling out of breath or like you cannot take a deep breath. Having trouble falling asleep or staying asleep, or experiencing restlessness. Sweating. Nausea, diarrhea, or irritable bowel syndrome (IBS). Behavioral symptoms: Experiencing erratic moods or irritability. Avoidance of new situations. Avoidance of people. Extreme difficulty making decisions. How is this diagnosed? This condition is diagnosed based on your symptoms and medical history. You will also have a physical exam. Your health care provider may perform tests to rule out other possible causes of your symptoms. To be  diagnosed with GAD, a person must have anxiety that: Is out of his or her control. Affects several different aspects of his or her life, such as work and relationships. Causes distress that makes him or her unable to take part in normal activities. Includes at least three symptoms of GAD, such as restlessness, fatigue, trouble concentrating, irritability, muscle tension, or sleep problems. Before your health care provider can confirm a diagnosis of GAD, these symptoms must be present more days than they are not, and they must last for 6 months or longer. How is this treated? This condition may be treated with: Medicine. Antidepressant medicine is usually prescribed for long-term daily control. Anti-anxiety medicines may be added in severe cases, especially when panic attacks occur. Talk therapy (psychotherapy). Certain types of talk therapy can be helpful in treating GAD by providing support, education, and guidance. Options include: Cognitive behavioral therapy (CBT). People learn coping skills and self-calming techniques to ease their physical symptoms. They learn to identify unrealistic thoughts and behaviors and  to replace them with more appropriate thoughts and behaviors. Acceptance and commitment therapy (ACT). This treatment teaches people how to be mindful as a way to cope with unwanted thoughts and feelings. Biofeedback. This process trains you to manage your body's response (physiological response) through breathing techniques and relaxation methods. You will work with a therapist while machines are used to monitor your physical symptoms. Stress management techniques. These include yoga, meditation, and exercise. A mental health specialist can help determine which treatment is best for you. Some people see improvement with one type of therapy. However, other people require a combination of therapies. Follow these instructions at home: Lifestyle Maintain a consistent routine and  schedule. Anticipate stressful situations. Create a plan and allow extra time to work with your plan. Practice stress management or self-calming techniques that you have learned from your therapist or your health care provider. Exercise regularly and spend time outdoors. Eat a healthy diet that includes plenty of vegetables, fruits, whole grains, low-fat dairy products, and lean protein. Do not eat a lot of foods that are high in fat, added sugar, or salt (sodium). Drink plenty of water. Avoid alcohol. Alcohol can increase anxiety. Avoid caffeine and certain over-the-counter cold medicines. These may make you feel worse. Ask your pharmacist which medicines to avoid. General instructions Take over-the-counter and prescription medicines only as told by your health care provider. Understand that you are likely to have setbacks. Accept this and be kind to yourself as you persist to take better care of yourself. Anticipate stressful situations. Create a plan and allow extra time to work with your plan. Recognize and accept your accomplishments, even if you judge them as small. Spend time with people who care about you. Keep all follow-up visits. This is important. Where to find more information [place]: https://carter.com/ Substance Abuse and Mental Health Services: ktimeonline.com Contact a health care provider if: Your symptoms do not get better. Your symptoms get worse. You have signs of depression, such as: A persistently sad or irritable mood. Loss of enjoyment in activities that used to bring you joy. Change in weight or eating. Changes in sleeping habits. Get help right away if: You have thoughts about hurting yourself or others. If you ever feel like you may hurt yourself or others, or have thoughts about taking your own life, get help right away. Go to your nearest emergency department or: Call your local emergency services (911 in the [place] Call a suicide  crisis helpline, such as the [place] at 610-575-1515 or 988 in the [place]. This is open 24 hours a day in the [place] Text the Crisis Text Line at 3406353247 (in the [place].). Summary Generalized anxiety disorder (GAD) is a mental health condition that involves worry that is not triggered by a specific event. People with GAD often worry excessively about many things in their lives, such as their health and family. GAD may cause symptoms such as restlessness, trouble concentrating, sleep problems, frequent sweating, nausea, diarrhea, headaches, and trembling or muscle twitching. A mental health specialist can help determine which treatment is best for you. Some people see improvement with one type of therapy. However, other people require a combination of therapies. This information is not intended to replace advice given to you by your health care provider. Make sure you discuss any questions you have with your health care provider. Document Revised: 12/14/2020 Document Reviewed: 09/11/2020 Elsevier Patient Education  [place].

## 2021-07-26 NOTE — Progress Notes (Addendum)
Chief Complaint  Patient presents with   Annual Exam   Annual  1. Obesity wants to resume adipex 4 months on and 3 months off bp controlled  2. Chronic fatigue denies snoring watch shows sleeping 7 hrs and 21 min and awakens for 4 min and RED 59 min and deep sleep hr 65-89 and RR 15-19 no report of O2 sats she has been anxious and stopped lexapro 10 mg per ob/gyn due to weight gain concern also wellbutrin 150 xl caused worsening itching so stopped  3. Itching takes zyrtec 10 mg qd will f/u with [facility]  4. Chronic elevated WBC due to f/u Dr. Tasia Catchings 08/2021   Review of Systems  Constitutional:  Positive for malaise/fatigue. Negative for weight loss.  HENT:  Negative for hearing loss.   Eyes:  Negative for blurred vision.  Respiratory:  Negative for shortness of breath.   Cardiovascular:  Negative for chest pain.  Gastrointestinal:  Negative for abdominal pain and blood in stool.  Genitourinary:  Negative for dysuria.  Musculoskeletal:  Negative for falls and joint pain.  Skin:  Positive for itching.  Neurological:  Negative for headaches.  Psychiatric/Behavioral:  Negative for depression. The patient is nervous/anxious.   Past Medical History:  Diagnosis Date   Anxiety    COVID-19    06/2020, 04/23/21   GERD (gastroesophageal reflux disease)    History of kidney stones    h/o   Leukocytosis 08/2020   Lymphocytosis 08/2020   Vitamin D deficiency    Past Surgical History:  Procedure Laterality Date   ABDOMINAL HYSTERECTOMY     total Dr. Enzo Bi 05/13/17 no h/o abnormal pap for endometriosis and right ovarian cyst   DIAGNOSTIC LAPAROSCOPY     ESOPHAGOGASTRODUODENOSCOPY (EGD) WITH PROPOFOL N/A 06/19/2016   Procedure: ESOPHAGOGASTRODUODENOSCOPY (EGD) WITH PROPOFOL;  Surgeon: Lucilla Lame, MD;  Location: [facility];  Service: Endoscopy;  Laterality: N/A;   LAPAROSCOPIC VAGINAL HYSTERECTOMY WITH SALPINGO OOPHORECTOMY Bilateral 05/13/2017   Procedure: LAPAROSCOPIC  ASSISTED VAGINAL HYSTERECTOMY WITH BILATERAL SALPINGO OOPHORECTOMY;  Surgeon: Brayton Mars, MD;  Location: [facility] OR[facility];  Service: Gynecology;  Laterality: Bilateral;   LAPROSCOPY     REMOVAL OF DRUG DELIVERY IMPLANT Left 05/13/2017   Procedure: REMOVAL OF DRUG DELIVERY IMPLANT, LEFT ARM;  Surgeon: Brayton Mars, MD;  Location: [facility] OR[facility];  Service: Gynecology;  Laterality: Left;   TONSILLECTOMY     TONSILLECTOMY     Family History  Problem Relation Age of Onset   Diabetes Maternal Grandmother    Diabetes Maternal Grandfather    Diabetes Mother        type 2   Other Mother        abnormal mammogram 07/2020    Diabetes Sister        type 2    Asthma Daughter    Diabetes Maternal Aunt        type 2   Asthma Daughter    Diabetes Maternal Aunt        type 2   Social History   Socioeconomic History   Marital status: Married    Spouse name: Not on file   Number of children: Not on file   Years of education: Not on file   Highest education level: Not on file  Occupational History   Not on file  Tobacco Use   Smoking status: Never   Smokeless tobacco: Never  Vaping Use   Vaping Use: Never used  Substance and Sexual Activity   Alcohol use:  No   Drug use: No   Sexual activity: Yes    Birth control/protection: None, Surgical  Other Topics Concern   Not on file  Social History Narrative   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   calcium-vitamin D (OSCAL WITH D) 500-200 MG-UNIT tablet Take 1 tablet by mouth.   cetirizine (ZYRTEC) 10 MG tablet Take 10 mg by mouth daily as  needed for allergies.   estradiol (VIVELLE-DOT) 0.1 MG/24HR patch PLACE 1 PATCH (0.1 MG TOTAL) ONTO THE SKIN 2 TIMES A WEEK.   [DISCONTINUED] lansoprazole (PREVACID) 30 MG capsule TAKE 1 CAPSULE BY MOUTH AT BEDTIME 30 MINUTES BEFORE FOOD   Allergies  Allergen Reactions   Wellbutrin [Bupropion]     itching   No results found for this or any previous visit (from the past 2160 hour(s)). Objective  Body mass index is 32.84 kg/m. Wt Readings from Last 3 Encounters:  07/26/21 191 lb 3.2 oz (86.7 kg)  09/13/20 203 lb 1.6 oz (92.1 kg)  07/25/20 200 lb 8 oz (90.9 kg)   Temp Readings from Last 3 Encounters:  07/26/21 97.8 F (36.6 C) (Oral)  07/25/20 97.7 F (36.5 C)  07/13/20 97.8 F (36.6 C) (Oral)   BP Readings from Last 3 Encounters:  07/26/21 110/70  09/13/20 123/79  07/25/20 (!) 111/92   Pulse Readings from Last 3 Encounters:  07/26/21 78  09/13/20 87  07/25/20 84    Physical Exam Vitals and nursing note reviewed.  Constitutional:      Appearance: Normal appearance. She is well-developed and well-groomed.  HENT:     Head: Normocephalic and atraumatic.  Eyes:     Conjunctiva/sclera: Conjunctivae normal.     Pupils: Pupils are equal, round, and reactive to light.  Cardiovascular:     Rate and Rhythm: Normal rate and regular rhythm.     Heart sounds: Normal heart sounds. No murmur heard. Pulmonary:     Effort: Pulmonary effort is normal.     Breath sounds: Normal breath sounds.  Abdominal:     General: Abdomen is flat. Bowel sounds are normal.     Tenderness: There is no abdominal tenderness.  Musculoskeletal:        General: No tenderness.  Skin:    General: Skin is warm and dry.  Neurological:     General: No focal deficit present.     Mental Status: She is alert and oriented to person, place, and time. Mental status is at baseline.     Cranial Nerves: Cranial nerves 2-12 are intact.     Motor: Motor function is intact.     Coordination: Coordination is  intact.     Gait: Gait is intact.  Psychiatric:        Attention and Perception: Attention and perception normal.        Mood and Affect: Mood and affect normal.        Speech: Speech normal.        Behavior: Behavior  normal. Behavior is cooperative.        Thought Content: Thought content normal.        Cognition and Memory: Cognition and memory normal.        Judgment: Judgment normal.    Assessment  Plan  Annual physical exam - Plan:  See below  Comprehensive metabolic panel, Lipid panel, CBC with Differential/Platelet, TSH, T4, free, Urinalysis, Routine w reflex microscopic, Hemoglobin A1c, Iron, TIBC and Ferritin Panel, Vitamin B12, Vitamin D (25 hydroxy), Pathologist smear review  Obesity (BMI 30-39.9) - Plan: phentermine (ADIPEX-P) 37.5 MG tablet, phentermine (ADIPEX-P) 37.5 MG tablet  Anxiety/ocd- Plan: escitalopram (LEXAPRO) 10 MG tablet resume has been tapered off by ob/gyn but wants to resolve  Gastroesophageal reflux disease, unspecified whether esophagitis present - Plan: lansoprazole (PREVACID) 30 MG capsule   ANA+ and ESR+  Latest Reference Range & Units 08/09/21 10:52  Anti Nuclear Antibody (ANA) NEGATIVE  POSITIVE !  ANA Pattern 1  Nuclear, Dense Fine Speckled !  ANA Titer 1 titer 6:701 (H)  Cyclic Citrullin Peptide Ab UNITS <16  RA Latex Turbid. <14 IU/mL <14  !: Data is abnormal (H): Data is abnormally high  Latest Reference Range & Units 08/09/21 10:52  Sed Rate 0 - 20 mm/hr 37 (H)  (H): Data is abnormally high  Latest Reference Range & Units 08/09/21 10:52  CRP 0.5 - 20.0 mg/dL 2.0      HM Flu shot utd Tdap had 02/09/15  moderna shot 3/3 declines    Declines hep C testing 07/13/20 MMR and hep B immune    S/p total hysterectomy 05/23/17 no h/o abnormal pap was for endometriosis and right ovarian cyst rec f/u with Dr. Keturah Barre as sch 07/2018 and ask if pap still rec. per pt as of 07/2021 they will do pelvic Q10 years    Colonoscopy consider age 32     Mammogram consider age 73-39 y.o to baseline no FH   [facility] f/u yearly    [facility] yearly    [facility] Dr. DeFrancesco>Dr. Marcelline Mates pap 02/12/17  S/p hysterectomy   Est derm [facility] no need as of 07/26/21 but may contact due to h/o hives/itching for patch test   D3 2000 to 5000 IU qd  rec healthy diet and exercise    Provider: Dr. Olivia Mackie McLean-Scocuzza-Internal Medicine

## 2021-07-27 ENCOUNTER — Encounter: Payer: Self-pay | Admitting: Internal Medicine

## 2021-07-27 LAB — URINALYSIS, ROUTINE W REFLEX MICROSCOPIC
Bilirubin Urine: NEGATIVE
Glucose, UA: NEGATIVE
Hgb urine dipstick: NEGATIVE
Ketones, ur: NEGATIVE
Leukocytes,Ua: NEGATIVE
Nitrite: NEGATIVE
Protein, ur: NEGATIVE
Specific Gravity, Urine: 1.02 (ref 1.001–1.035)
pH: 6.5 (ref 5.0–8.0)

## 2021-07-27 LAB — IRON,TIBC AND FERRITIN PANEL
%SAT: 18 % (calc) (ref 16–45)
Ferritin: 56 ng/mL (ref 16–154)
Iron: 60 ug/dL (ref 40–190)
TIBC: 325 mcg/dL (calc) (ref 250–450)

## 2021-07-27 LAB — PATHOLOGIST SMEAR REVIEW

## 2021-07-31 ENCOUNTER — Telehealth: Payer: Self-pay | Admitting: *Deleted

## 2021-07-31 NOTE — Telephone Encounter (Signed)
Patient would like a call about her appointment schedule.

## 2021-08-07 ENCOUNTER — Telehealth: Payer: Self-pay

## 2021-08-07 NOTE — Telephone Encounter (Signed)
Please cancel labs on 3/30 and switch to virtual, make virtual to late afternoon. Please update pt on appt update.  ?

## 2021-08-07 NOTE — Telephone Encounter (Signed)
-----   Message from Earlie Server, MD sent at 08/05/2021  3:38 PM EST ----- ?Ok to cancel labs and switch to virtual, please make virtual to late afternoon.thanks.  ?----- Message ----- ?From: McLean-Scocuzza, Nino Glow, MD ?Sent: 07/26/2021   9:04 AM EST ?To: Earlie Server, MD ? ?Pt doing cmet and cbc labs today  ?Not sure if labs needed before next visit  ?Also if labs not needed pt wondering if appt can be changed to virtual  ? ? ?

## 2021-08-09 ENCOUNTER — Encounter: Payer: Self-pay | Admitting: Internal Medicine

## 2021-08-09 ENCOUNTER — Other Ambulatory Visit (INDEPENDENT_AMBULATORY_CARE_PROVIDER_SITE_OTHER): Payer: BC Managed Care – PPO

## 2021-08-09 ENCOUNTER — Other Ambulatory Visit: Payer: Self-pay

## 2021-08-09 DIAGNOSIS — R748 Abnormal levels of other serum enzymes: Secondary | ICD-10-CM | POA: Diagnosis not present

## 2021-08-09 DIAGNOSIS — D72829 Elevated white blood cell count, unspecified: Secondary | ICD-10-CM | POA: Diagnosis not present

## 2021-08-09 DIAGNOSIS — M2550 Pain in unspecified joint: Secondary | ICD-10-CM

## 2021-08-09 LAB — GAMMA GT: GGT: 38 U/L (ref 7–51)

## 2021-08-09 LAB — C-REACTIVE PROTEIN: CRP: 2 mg/dL (ref 0.5–20.0)

## 2021-08-09 LAB — SEDIMENTATION RATE: Sed Rate: 37 mm/hr — ABNORMAL HIGH (ref 0–20)

## 2021-08-09 NOTE — Addendum Note (Signed)
Addended by: Orland Mustard on: 08/09/2021 09:07 AM   Modules accepted: Orders

## 2021-08-11 ENCOUNTER — Other Ambulatory Visit: Payer: 59

## 2021-08-11 ENCOUNTER — Ambulatory Visit: Payer: 59 | Admitting: Oncology

## 2021-08-11 LAB — CYCLIC CITRUL PEPTIDE ANTIBODY, IGG: Cyclic Citrullin Peptide Ab: <16 UNITS

## 2021-08-11 LAB — RHEUMATOID FACTOR: Rheumatoid fact SerPl-aCnc: <14 IU/mL (ref <14)

## 2021-08-11 LAB — ANTI-NUCLEAR AB-TITER (ANA TITER): ANA Titer 1: 1:320 {titer} — ABNORMAL HIGH

## 2021-08-11 LAB — ANA: Anti Nuclear Antibody (ANA): POSITIVE — AB

## 2021-08-13 ENCOUNTER — Encounter: Payer: Self-pay | Admitting: Internal Medicine

## 2021-08-13 LAB — ALKALINE PHOSPHATASE, ISOENZYMES
Alkaline Phosphatase: 135 IU/L — ABNORMAL HIGH (ref 44–121)
BONE FRACTION: 26 % (ref 14–68)
INTESTINAL FRAC.: 2 % (ref 0–18)
LIVER FRACTION: 72 % (ref 18–85)

## 2021-08-14 DIAGNOSIS — D72829 Elevated white blood cell count, unspecified: Secondary | ICD-10-CM | POA: Insufficient documentation

## 2021-08-14 DIAGNOSIS — R768 Other specified abnormal immunological findings in serum: Secondary | ICD-10-CM | POA: Insufficient documentation

## 2021-08-14 DIAGNOSIS — R700 Elevated erythrocyte sedimentation rate: Secondary | ICD-10-CM | POA: Insufficient documentation

## 2021-08-14 NOTE — Addendum Note (Signed)
Addended by: Orland Mustard on: 08/14/2021 11:16 AM   Modules accepted: Orders

## 2021-08-14 NOTE — Telephone Encounter (Signed)
-----   Message from Delorise Jackson, MD sent at 08/13/2021  9:21 PM EDT ----- ?Alkaline phos  appears to be coming mostly from liver source  ? ?Rheumatoid factor normal ?Sed rate inflammatory marker slightly elevated ?CRP inflammatory marker normal  ?GGT normal  ?CCP normal for rheumatoid arthritis  ?Ana + titer 1:320 ?-is she agreeable to rheumatology referral for further work up of cause I.e autoimmune cause such as lupus, Sjogrens or systemic sclerosis, etc?  ?This warrants rheumatology referral ? ?Is she agreeable [place] or [place]?  ? ?

## 2021-08-17 ENCOUNTER — Encounter: Payer: Self-pay | Admitting: Internal Medicine

## 2021-08-17 NOTE — Telephone Encounter (Signed)
Please advise on saxenda weight loss medication ?

## 2021-08-18 ENCOUNTER — Other Ambulatory Visit: Payer: Self-pay

## 2021-08-18 MED ORDER — SAXENDA 18 MG/3ML ~~LOC~~ SOPN
0.6000 mg | PEN_INJECTOR | Freq: Every day | SUBCUTANEOUS | 2 refills | Status: DC
  Filled 2021-08-18: qty 30, 90d supply, fill #0
  Filled 2021-08-25: qty 15, 30d supply, fill #0

## 2021-08-25 ENCOUNTER — Other Ambulatory Visit: Payer: Self-pay

## 2021-08-25 MED ORDER — UNIFINE PENTIPS 31G X 5 MM MISC
0 refills | Status: DC
  Filled 2021-08-25: qty 100, 90d supply, fill #0

## 2021-08-28 ENCOUNTER — Other Ambulatory Visit: Payer: Self-pay

## 2021-08-28 ENCOUNTER — Other Ambulatory Visit: Payer: Self-pay | Admitting: Internal Medicine

## 2021-08-28 DIAGNOSIS — R110 Nausea: Secondary | ICD-10-CM

## 2021-08-28 MED ORDER — ONDANSETRON 4 MG PO TBDP
4.0000 mg | ORAL_TABLET | Freq: Three times a day (TID) | ORAL | 2 refills | Status: DC | PRN
  Filled 2021-08-28: qty 40, 14d supply, fill #0

## 2021-08-29 ENCOUNTER — Other Ambulatory Visit: Payer: Self-pay

## 2021-08-30 NOTE — Progress Notes (Signed)
Patient scheduled to be seen 07/27/22

## 2021-08-31 ENCOUNTER — Other Ambulatory Visit: Payer: BC Managed Care – PPO

## 2021-08-31 ENCOUNTER — Ambulatory Visit: Payer: Self-pay | Admitting: Oncology

## 2021-08-31 ENCOUNTER — Inpatient Hospital Stay: Payer: BC Managed Care – PPO | Attending: Oncology | Admitting: Oncology

## 2021-08-31 DIAGNOSIS — R768 Other specified abnormal immunological findings in serum: Secondary | ICD-10-CM | POA: Diagnosis not present

## 2021-08-31 DIAGNOSIS — D72820 Lymphocytosis (symptomatic): Secondary | ICD-10-CM

## 2021-08-31 NOTE — Progress Notes (Signed)
Pt has questions regarding most recent lab work. ?

## 2021-09-04 ENCOUNTER — Encounter: Payer: Self-pay | Admitting: Oncology

## 2021-09-04 NOTE — Telephone Encounter (Signed)
Patient having diarrhea since starting Saxenda, Does Patient need an appointment?  ?

## 2021-09-04 NOTE — Progress Notes (Signed)
HEMATOLOGY-ONCOLOGY TeleHEALTH VISIT PROGRESS NOTE  ?I connected with Rebecca Evans on 08/31/21  at  2:15 PM EDT by video enabled telemedicine visit and verified that I am speaking with the correct person using two identifiers. ?I discussed the limitations, risks, security and privacy concerns of performing an evaluation and management service by telemedicine and the availability of in-person appointments. The patient expressed understanding and agreed to proceed.  ? ?Other persons participating in the visit and their role in the encounter:  ?None ? ?Patient's location: Home  ?Provider's location: office ?Chief Complaint: Follow-up for leukocytosis ? ? ?INTERVAL HISTORY ?Rebecca Evans is a 36 y.o. female who has above history reviewed by me today presents for follow up visit for leukocytosis ?Patient has had recent blood work done by primary care provider.  Was found to have a positive ANA.  Patient has been referred to rheumatology for further evaluation.  She denies any arthralgia, rash. ? ?Review of Systems  ?Constitutional:  Negative for appetite change, chills, fatigue and fever.  ?HENT:   Negative for hearing loss and voice change.   ?Eyes:  Negative for eye problems.  ?Respiratory:  Negative for chest tightness and cough.   ?Cardiovascular:  Negative for chest pain.  ?Gastrointestinal:  Negative for abdominal distention, abdominal pain and blood in stool.  ?Endocrine: Negative for hot flashes.  ?Genitourinary:  Negative for difficulty urinating and frequency.   ?Musculoskeletal:  Negative for arthralgias.  ?Skin:  Negative for itching and rash.  ?Neurological:  Negative for extremity weakness.  ?Hematological:  Negative for adenopathy.  ?Psychiatric/Behavioral:  Negative for confusion.    ?Past Medical History:  ?Diagnosis Date  ? Anxiety   ? COVID-19   ? 06/2020, 04/23/21  ? GERD (gastroesophageal reflux disease)   ? History of kidney stones   ? h/o  ? Leukocytosis 08/2020  ? Lymphocytosis  08/2020  ? Vitamin D deficiency   ? ?Past Surgical History:  ?Procedure Laterality Date  ? ABDOMINAL HYSTERECTOMY    ? total Dr. Enzo Bi 05/13/17 no h/o abnormal pap for endometriosis and right ovarian cyst  ? DIAGNOSTIC LAPAROSCOPY    ? ESOPHAGOGASTRODUODENOSCOPY (EGD) WITH PROPOFOL N/A 06/19/2016  ? Procedure: ESOPHAGOGASTRODUODENOSCOPY (EGD) WITH PROPOFOL;  Surgeon: Lucilla Lame, MD;  Location: [facility];  Service: Endoscopy;  Laterality: N/A;  ? LAPAROSCOPIC VAGINAL HYSTERECTOMY WITH SALPINGO OOPHORECTOMY Bilateral 05/13/2017  ? Procedure: LAPAROSCOPIC ASSISTED VAGINAL HYSTERECTOMY WITH BILATERAL SALPINGO OOPHORECTOMY;  Surgeon: Brayton Mars, MD;  Location: [facility];  Service: Gynecology;  Laterality: Bilateral;  ? LAPROSCOPY    ? REMOVAL OF DRUG DELIVERY IMPLANT Left 05/13/2017  ? Procedure: REMOVAL OF DRUG DELIVERY IMPLANT, LEFT ARM;  Surgeon: Brayton Mars, MD;  Location: [facility];  Service: Gynecology;  Laterality: Left;  ? TONSILLECTOMY    ? TONSILLECTOMY    ?  ?Family History  ?Problem Relation Age of Onset  ? Diabetes Maternal Grandmother   ? Diabetes Maternal Grandfather   ? Diabetes Mother   ?     type 2  ? Other Mother   ?     abnormal mammogram 07/2020   ? Diabetes Sister   ?     type 2   ? Asthma Daughter   ? Diabetes Maternal Aunt   ?     type 2  ? Asthma Daughter   ? Diabetes Maternal Aunt   ?     type 2  ?  ?Social History  ? ?Socioeconomic History  ? Marital status: Married  ?  Spouse name: Not on file  ? Number of children: Not on file  ? Years of education: Not on file  ? Highest education level: Not on file  ?Occupational History  ? Not on file  ?Tobacco Use  ? Smoking status: Never  ? Smokeless tobacco: Never  ?Vaping Use  ? Vaping Use: Never used  ?Substance and Sexual Activity  ? Alcohol use: No  ? Drug use: No  ? Sexual activity: Yes  ?  Birth control/protection: None, Surgical  ?Other Topics Concern  ? Not on file  ?Social History Narrative  ? 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 Married   ? No guns   ? Wears seat belt   ? Safe in relationship   ? ?Social Determinants of Health  ? ?Financial Resource Strain: Not on file  ?Food Insecurity: Not on file  ?Transportation Needs: Not on file  ?Physical Activity: Not on file  ?Stress: Not on file  ?Social Connections: Not on file  ?Intimate Partner Violence: Not on file  ?  ?Current Outpatient Medications on File Prior to Visit  ?Medication Sig Dispense Refill  ? Biotin 10000 MCG TABS Take by mouth daily.    ? calcium-vitamin D (OSCAL WITH D) 500-200 MG-UNIT tablet Take 1 tablet by mouth.    ? cetirizine (ZYRTEC) 10 MG tablet Take 10 mg by mouth daily as needed for allergies.    ? escitalopram (LEXAPRO) 10 MG tablet TAKE 1 TABLET (10 MG TOTAL) BY MOUTH DAILY. 90 tablet 3  ? estradiol (VIVELLE-DOT) 0.1 MG/24HR patch PLACE 1 PATCH (0.1 MG TOTAL) ONTO THE SKIN 2 TIMES A WEEK. 8 patch 11  ? Insulin Pen Needle (UNIFINE PENTIPS) 31G X 5 MM MISC Use as directed with Saxenda 100 each 0  ? lansoprazole (PREVACID) 30 MG capsule TAKE 1 CAPSULE BY MOUTH AT BEDTIME 30 MINUTES BEFORE FOOD 90 capsule 3  ? Liraglutide -Weight Management (SAXENDA) 18 MG/3ML SOPN Inject 0.6 mg into the skin daily. 0.6 qd x 1 week then increase to 1.2 mg qd x 1 week and increase to 1.8 daily x 1 week and increase 2.4 daily x 1 week and increase to 3 mg qd if can tolerate 30 mL 2  ? ondansetron (ZOFRAN-ODT) 4 MG disintegrating tablet Take 1 tablet (4 mg total) by mouth every 8 (eight) hours as needed for nausea or vomiting. 40 tablet 2  ? phentermine (ADIPEX-P) 37.5 MG tablet Take 1 tablet (37.5 mg total) by mouth daily before breakfast. 60 tablet 0  ? phentermine (ADIPEX-P) 37.5 MG tablet Take 1 tablet (37.5 mg total) by mouth daily before breakfast. 1/2 60 tablet 0  ? ?No current facility-administered medications on file prior to visit.  ?  ?Allergies  ?Allergen Reactions  ? Wellbutrin [Bupropion]   ?  itching  ?  ? ?   ?Observations/Objective: ?Today's Vitals  ? 08/31/21 1347  ?PainSc: 0-No pain  ? ?There is no height or weight on file to calculate BMI.  ?Physical Exam ?Neurological:  ?   Mental Status: She is alert.  ? ? ?CBC ?   ?Component Value Date/Time  ? WBC 11.9 (H) 07/26/2021 0919  ? RBC 4.78 07/26/2021 0919  ? HGB 13.6 07/26/2021 0919  ? HGB 13.6 07/14/2020 0920  ? HGB 13.4 07/06/2014 0000  ? HCT 41.2 07/26/2021 0919  ? HCT 40.7 07/14/2020 0920  ? HCT 39 07/06/2014 0000  ? PLT 359.0  07/26/2021 0919  ? PLT 345 07/14/2020 0920  ? PLT 364 07/06/2014 0000  ? MCV 86.3 07/26/2021 0919  ? MCV 88 07/14/2020 0920  ? MCV 84 05/06/2013 0621  ? MCH 29.2 07/25/2020 1129  ? MCHC 32.9 07/26/2021 0919  ? RDW 13.6 07/26/2021 0919  ? RDW 13.5 07/14/2020 0920  ? RDW 14.5 05/06/2013 0621  ? LYMPHSABS 4.6 (H) 07/26/2021 0919  ? LYMPHSABS 3.6 (H) 07/14/2020 0920  ? LYMPHSABS 3.8 (H) 05/06/2013 7048  ? MONOABS 0.4 07/26/2021 0919  ? MONOABS 0.7 05/06/2013 0621  ? EOSABS 0.2 07/26/2021 0919  ? EOSABS 0.3 07/14/2020 0920  ? EOSABS 0.2 05/06/2013 8891  ? BASOSABS 0.1 07/26/2021 0919  ? BASOSABS 0.1 07/14/2020 0920  ? BASOSABS 0.1 05/06/2013 6945  ?  ?CMP  ?   ?Component Value Date/Time  ? NA 139 07/26/2021 0919  ? NA 140 06/24/2019 0835  ? K 4.2 07/26/2021 0919  ? CL 102 07/26/2021 0919  ? CO2 33 (H) 07/26/2021 0919  ? GLUCOSE 89 07/26/2021 0919  ? BUN 11 07/26/2021 0919  ? BUN 7 06/24/2019 0835  ? CREATININE 0.63 07/26/2021 0919  ? CALCIUM 9.4 07/26/2021 0919  ? PROT 7.4 07/26/2021 0919  ? PROT 7.2 06/24/2019 0835  ? ALBUMIN 4.2 07/26/2021 0919  ? ALBUMIN 4.2 06/24/2019 0835  ? AST 17 07/26/2021 0919  ? ALT 23 07/26/2021 0919  ? ALKPHOS 135 (H) 08/09/2021 1052  ? BILITOT 0.5 07/26/2021 0919  ? BILITOT 0.4 06/24/2019 0835  ? GFRNONAA 121 06/24/2019 0835  ? GFRAA 139 06/24/2019 0835  ?  ? ?Assessment and Plan: ?1. Lymphocytosis   ?2. Positive ANA (antinuclear antibody)   ?  ?#Lymphocytosis, chronic, ?Previous work-up showed normal LDH, negative  hepatitis C, peripheral blood flow cytometry is negative for immunophenotypic abnormality. ?JAK2 V617F mutation with reflex results are  negative ?Lymphocytosis likely reactive. ?ANA is positive and a pending further

## 2021-09-05 ENCOUNTER — Encounter: Payer: Self-pay | Admitting: Internal Medicine

## 2021-09-06 ENCOUNTER — Encounter: Payer: Self-pay | Admitting: Internal Medicine

## 2021-09-11 ENCOUNTER — Other Ambulatory Visit: Payer: Self-pay

## 2021-09-19 ENCOUNTER — Telehealth: Payer: Self-pay | Admitting: Obstetrics and Gynecology

## 2021-09-19 DIAGNOSIS — Z79890 Hormone replacement therapy (postmenopausal): Secondary | ICD-10-CM

## 2021-09-19 NOTE — Telephone Encounter (Signed)
Pt has appointment  scheduled for June 9th @ 11:00 am for her physical. Pt is asking if she can get her Hormone RX filled until her scheduled appt? ?

## 2021-09-19 NOTE — Telephone Encounter (Signed)
Patient needs annual exam prior to next refill due.  ?

## 2021-09-22 DIAGNOSIS — D72820 Lymphocytosis (symptomatic): Secondary | ICD-10-CM | POA: Diagnosis not present

## 2021-09-22 DIAGNOSIS — R5382 Chronic fatigue, unspecified: Secondary | ICD-10-CM | POA: Diagnosis not present

## 2021-09-22 DIAGNOSIS — R768 Other specified abnormal immunological findings in serum: Secondary | ICD-10-CM | POA: Diagnosis not present

## 2021-11-10 ENCOUNTER — Encounter: Payer: Self-pay | Admitting: Obstetrics and Gynecology

## 2021-11-16 LAB — VITAMIN D 25 HYDROXY (VIT D DEFICIENCY, FRACTURES): Vit D, 25-Hydroxy: 51.6

## 2021-11-16 LAB — CBC AND DIFFERENTIAL
HCT: 40 (ref 36–46)
Hemoglobin: 13.8 (ref 12.0–16.0)
Platelets: 367 10*3/uL (ref 150–400)
WBC: 10.9

## 2021-11-16 LAB — CBC: RBC: 4.85 (ref 3.87–5.11)

## 2021-12-13 ENCOUNTER — Telehealth: Payer: Self-pay

## 2021-12-13 NOTE — Telephone Encounter (Signed)
PA has been started via covermymeds on 12/13/21 for pt's Saxenda '18MG'$ /3ML pen-injectors  Key: [place]  Awaiting denial or approval

## 2022-01-12 ENCOUNTER — Encounter: Payer: Self-pay | Admitting: Obstetrics and Gynecology

## 2022-01-18 ENCOUNTER — Encounter: Payer: Self-pay | Admitting: Nurse Practitioner

## 2022-01-18 LAB — IRON,TIBC AND FERRITIN PANEL: Ferritin: 60

## 2022-01-18 LAB — BASIC METABOLIC PANEL
BUN: 9 (ref 4–21)
CO2: 21 (ref 13–22)
Chloride: 100 (ref 99–108)
Creatinine: 0.6 (ref 0.5–1.1)
Glucose: 81
Potassium: 4.5 mEq/L (ref 3.5–5.1)
Sodium: 139 (ref 137–147)

## 2022-01-18 LAB — VITAMIN B12: Vitamin B-12: 1172

## 2022-01-18 LAB — CBC AND DIFFERENTIAL
HCT: 43 (ref 36–46)
Hemoglobin: 14.3 (ref 12.0–16.0)
Neutrophils Absolute: 5.8
Platelets: 353 10*3/uL (ref 150–400)
WBC: 10.3

## 2022-01-18 LAB — LIPID PANEL: Triglycerides: 185 — AB (ref 40–160)

## 2022-01-18 LAB — COMPREHENSIVE METABOLIC PANEL
Albumin: 4.1 (ref 3.5–5.0)
Calcium: 9.2 (ref 8.7–10.7)
Globulin: 3.1
eGFR: 120

## 2022-01-18 LAB — HEMOGLOBIN A1C: Hemoglobin A1C: 5.6

## 2022-01-18 LAB — CBC: RBC: 5.08 (ref 3.87–5.11)

## 2022-01-18 LAB — HEPATIC FUNCTION PANEL
ALT: 22 U/L (ref 7–35)
AST: 23 (ref 13–35)
Bilirubin, Total: 0.3

## 2022-01-20 LAB — LAB REPORT - SCANNED
A1c: 5.6
EGFR: 120

## 2022-01-27 ENCOUNTER — Other Ambulatory Visit: Payer: Self-pay | Admitting: Obstetrics and Gynecology

## 2022-01-27 DIAGNOSIS — E8940 Asymptomatic postprocedural ovarian failure: Secondary | ICD-10-CM

## 2022-02-21 ENCOUNTER — Encounter: Payer: Self-pay | Admitting: Obstetrics and Gynecology

## 2022-03-18 LAB — HEPATIC FUNCTION PANEL
ALT: 17 U/L (ref 7–35)
AST: 15 (ref 13–35)

## 2022-03-18 LAB — BASIC METABOLIC PANEL
BUN: 13 (ref 4–21)
Chloride: 103 (ref 99–108)
Creatinine: 0.7 (ref 0.5–1.1)
Glucose: 94
Potassium: 4.5 mEq/L (ref 3.5–5.1)
Sodium: 139 (ref 137–147)

## 2022-03-18 LAB — CBC AND DIFFERENTIAL
HCT: 43 (ref 36–46)
Hemoglobin: 14.5 (ref 12.0–16.0)
Neutrophils Absolute: 53
Platelets: 357 10*3/uL (ref 150–400)
WBC: 9.9

## 2022-03-18 LAB — CBC: RBC: 5.01 (ref 3.87–5.11)

## 2022-03-18 LAB — TSH: TSH: 0.66 (ref 0.41–5.90)

## 2022-03-18 LAB — COMPREHENSIVE METABOLIC PANEL: eGFR: 117

## 2022-05-21 ENCOUNTER — Other Ambulatory Visit: Payer: Self-pay | Admitting: Obstetrics and Gynecology

## 2022-05-21 DIAGNOSIS — E8940 Asymptomatic postprocedural ovarian failure: Secondary | ICD-10-CM

## 2022-07-18 NOTE — Progress Notes (Unsigned)
GYNECOLOGY ANNUAL PHYSICAL EXAM PROGRESS NOTE  Subjective:    Rebecca Evans is a 37 y.o. G72P1001 female who presents for an annual exam. The patient has no complaints today. The patient {is/is not/has never been:13135} sexually active. The patient participates in regular exercise: {yes/no/not asked:9010}. Has the patient ever been transfused or tattooed?: {yes/no/not asked:9010}. The patient reports that there {is/is not:9024} domestic violence in her life.    Menstrual History: Menarche age: 46 or 72 No LMP recorded (lmp unknown). Patient has had a hysterectomy.     Gynecologic History:  Contraception: status post hysterectomy History of STI's: Denies Last Pap: 02/12/2017. Results were: normal.  Denies h/o abnormal pap smears.   OB History  Gravida Para Term Preterm AB Living  2 1 1 $ 0 0 1  SAB IAB Ectopic Multiple Live Births  0 0 0 0 1    # Outcome Date GA Lbr Len/2nd Weight Sex Delivery Anes PTL Lv  2 Term 05/07/13    F Vag-Spont   LIV  1 Gravida             Past Medical History:  Diagnosis Date   Anxiety    COVID-19    06/2020, 04/23/21   GERD (gastroesophageal reflux disease)    History of kidney stones    h/o   Leukocytosis 08/2020   Lymphocytosis 08/2020   Vitamin D deficiency     Past Surgical History:  Procedure Laterality Date   ABDOMINAL HYSTERECTOMY     total Dr. Enzo Bi 05/13/17 no h/o abnormal pap for endometriosis and right ovarian cyst   DIAGNOSTIC LAPAROSCOPY     ESOPHAGOGASTRODUODENOSCOPY (EGD) WITH PROPOFOL N/A 06/19/2016   Procedure: ESOPHAGOGASTRODUODENOSCOPY (EGD) WITH PROPOFOL;  Surgeon: Lucilla Lame, MD;  Location: [facility];  Service: Endoscopy;  Laterality: N/A;   LAPAROSCOPIC VAGINAL HYSTERECTOMY WITH SALPINGO OOPHORECTOMY Bilateral 05/13/2017   Procedure: LAPAROSCOPIC ASSISTED VAGINAL HYSTERECTOMY WITH BILATERAL SALPINGO OOPHORECTOMY;  Surgeon: Brayton Mars, MD;  Location: [facility];  Service: Gynecology;   Laterality: Bilateral;   LAPROSCOPY     REMOVAL OF DRUG DELIVERY IMPLANT Left 05/13/2017   Procedure: REMOVAL OF DRUG DELIVERY IMPLANT, LEFT ARM;  Surgeon: Brayton Mars, MD;  Location: [facility];  Service: Gynecology;  Laterality: Left;   TONSILLECTOMY     TONSILLECTOMY      Family History  Problem Relation Age of Onset   Diabetes Maternal Grandmother    Diabetes Maternal Grandfather    Diabetes Mother        type 2   Other Mother        abnormal mammogram 07/2020    Diabetes Sister        type 2    Asthma Daughter    Diabetes Maternal Aunt        type 2   Asthma Daughter    Diabetes Maternal Aunt        type 2    Social History   Socioeconomic History   Marital status: Married    Spouse name: Not on file   Number of children: Not on file   Years of education: Not on file   Highest education level: Not on file  Occupational History   Not on file  Tobacco Use   Smoking status: Never   Smokeless tobacco: Never  Vaping Use   Vaping Use: Never used  Substance and Sexual Activity   Alcohol use: No   Drug use: No   Sexual activity: Yes    Birth  control/protection: None, Surgical  Other Topics Concern   Not on file  Social History Narrative   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/2021 in [place] and 2nd grade    Married    No guns    Wears seat belt    Safe in relationship    Social Determinants of Health   Financial Resource Strain: Not on file  Food Insecurity: Not on file  Transportation Needs: Not on file  Physical Activity: Not on file  Stress: Not on file  Social Connections: Not on file  Intimate Partner Violence: Not on file    Current Outpatient Medications on File Prior to Visit  Medication Sig Dispense Refill   Biotin 10000 MCG TABS Take by mouth daily.     calcium-vitamin D (OSCAL WITH D) 500-200 MG-UNIT tablet Take 1 tablet by mouth.     cetirizine (ZYRTEC) 10 MG tablet Take 10 mg by mouth daily as needed for allergies.      escitalopram (LEXAPRO) 10 MG tablet TAKE 1 TABLET (10 MG TOTAL) BY MOUTH DAILY. 90 tablet 3   estradiol (VIVELLE-DOT) 0.1 MG/24HR patch UNWRAP AND APPLY 1 PATCH TO SKIN TWICE WEEKLY. 12 patch 0   Insulin Pen Needle (UNIFINE PENTIPS) 31G X 5 MM MISC Use as directed with Saxenda 100 each 0   lansoprazole (PREVACID) 30 MG capsule TAKE 1 CAPSULE BY MOUTH AT BEDTIME 30 MINUTES BEFORE FOOD 90 capsule 3   Liraglutide -Weight Management (SAXENDA) 18 MG/3ML SOPN Inject 0.6 mg into the skin daily. 0.6 qd x 1 week then increase to 1.2 mg qd x 1 week and increase to 1.8 daily x 1 week and increase 2.4 daily x 1 week and increase to 3 mg qd if can tolerate 30 mL 2   ondansetron (ZOFRAN-ODT) 4 MG disintegrating tablet Take 1 tablet (4 mg total) by mouth every 8 (eight) hours as needed for nausea or vomiting. 40 tablet 2   phentermine (ADIPEX-P) 37.5 MG tablet Take 1 tablet (37.5 mg total) by mouth daily before breakfast. 60 tablet 0   phentermine (ADIPEX-P) 37.5 MG tablet Take 1 tablet (37.5 mg total) by mouth daily before breakfast. 1/2 60 tablet 0   No current facility-administered medications on file prior to visit.    Allergies  Allergen Reactions   Saxenda [Liraglutide -Weight Management]     Diarrhea with mucus   Wellbutrin [Bupropion]     itching     Review of Systems Constitutional: negative for chills, fatigue, fevers and sweats Eyes: negative for irritation, redness and visual disturbance Ears, nose, mouth, throat, and face: negative for hearing loss, nasal congestion, snoring and tinnitus Respiratory: negative for asthma, cough, sputum Cardiovascular: negative for chest pain, dyspnea, exertional chest pressure/discomfort, irregular heart beat, palpitations and syncope Gastrointestinal: negative for abdominal pain, change in bowel habits, nausea and vomiting Genitourinary: negative for abnormal menstrual periods, genital lesions, sexual problems and vaginal discharge, dysuria and urinary  incontinence Integument/breast: negative for breast lump, breast tenderness and nipple discharge Hematologic/lymphatic: negative for bleeding and easy bruising Musculoskeletal:negative for back pain and muscle weakness Neurological: negative for dizziness, headaches, vertigo and weakness Endocrine: negative for diabetic symptoms including polydipsia, polyuria and skin dryness Allergic/Immunologic: negative for hay fever and urticaria      Objective:  There were no vitals taken for this visit. There is no height or weight on file to calculate BMI.    General Appearance:    Alert, cooperative, no distress, appears stated age  Head:    Normocephalic, without obvious abnormality, atraumatic  Eyes:    PERRL, conjunctiva/corneas clear, EOM's intact, both eyes  Ears:    Normal external ear canals, both ears  Nose:   Nares normal, septum midline, mucosa normal, no drainage or sinus tenderness  Throat:   Lips, mucosa, and tongue normal; teeth and gums normal  Neck:   Supple, symmetrical, trachea midline, no adenopathy; thyroid: no enlargement/tenderness/nodules; no carotid bruit or JVD  Back:     Symmetric, no curvature, ROM normal, no CVA tenderness  Lungs:     Clear to auscultation bilaterally, respirations unlabored  Chest Wall:    No tenderness or deformity   Heart:    Regular rate and rhythm, S1 and S2 normal, no murmur, rub or gallop  Breast Exam:    No tenderness, masses, or nipple abnormality  Abdomen:     Soft, non-tender, bowel sounds active all four quadrants, no masses, no organomegaly.    Genitalia:    Pelvic:external genitalia normal, vagina without lesions, discharge, or tenderness, rectovaginal septum  normal. Cervix normal in appearance, no cervical motion tenderness, no adnexal masses or tenderness.  Uterus normal size, shape, mobile, regular contours, nontender.  Rectal:    Normal external sphincter.  No hemorrhoids appreciated. Internal exam not done.   Extremities:    Extremities normal, atraumatic, no cyanosis or edema  Pulses:   2+ and symmetric all extremities  Skin:   Skin color, texture, turgor normal, no rashes or lesions  Lymph nodes:   Cervical, supraclavicular, and axillary nodes normal  Neurologic:   CNII-XII intact, normal strength, sensation and reflexes throughout   .  Labs:  Lab Results  Component Value Date   WBC 11.9 (H) 07/26/2021   HGB 13.6 07/26/2021   HCT 41.2 07/26/2021   MCV 86.3 07/26/2021   PLT 359.0 07/26/2021    Lab Results  Component Value Date   CREATININE 0.63 07/26/2021   BUN 11 07/26/2021   NA 139 07/26/2021   K 4.2 07/26/2021   CL 102 07/26/2021   CO2 33 (H) 07/26/2021    Lab Results  Component Value Date   ALT 23 07/26/2021   AST 17 07/26/2021   ALKPHOS 135 (H) 08/09/2021   BILITOT 0.5 07/26/2021    Lab Results  Component Value Date   TSH 0.66 07/26/2021     Assessment:   No diagnosis found.   Plan:  Blood tests: Ordered. Breast self exam technique reviewed and patient encouraged to perform self-exam monthly. Contraception: status post hysterectomy. Discussed healthy lifestyle modifications. Mammogram  : Not age appropriate Pap smear  No longer medically necessary . COVID vaccination status: Follow up in 1 year for annual exam   Rubie Maid, MD [place]

## 2022-07-19 ENCOUNTER — Encounter: Payer: Self-pay | Admitting: Obstetrics and Gynecology

## 2022-07-19 ENCOUNTER — Ambulatory Visit (INDEPENDENT_AMBULATORY_CARE_PROVIDER_SITE_OTHER): Payer: BC Managed Care – PPO | Admitting: Obstetrics and Gynecology

## 2022-07-19 VITALS — BP 121/71 | HR 78 | Resp 16 | Ht 65.0 in | Wt 207.0 lb

## 2022-07-19 DIAGNOSIS — R7303 Prediabetes: Secondary | ICD-10-CM

## 2022-07-19 DIAGNOSIS — Z01419 Encounter for gynecological examination (general) (routine) without abnormal findings: Secondary | ICD-10-CM

## 2022-07-19 DIAGNOSIS — E8941 Symptomatic postprocedural ovarian failure: Secondary | ICD-10-CM

## 2022-07-19 DIAGNOSIS — R5383 Other fatigue: Secondary | ICD-10-CM

## 2022-07-19 DIAGNOSIS — E785 Hyperlipidemia, unspecified: Secondary | ICD-10-CM

## 2022-07-19 DIAGNOSIS — E669 Obesity, unspecified: Secondary | ICD-10-CM

## 2022-07-19 MED ORDER — FUROSEMIDE 20 MG PO TABS: 20.0000 mg | ORAL_TABLET | Freq: Every day | ORAL | 1 refills | Status: DC

## 2022-07-27 ENCOUNTER — Encounter: Payer: BC Managed Care – PPO | Admitting: Internal Medicine

## 2022-08-06 ENCOUNTER — Encounter: Payer: Self-pay | Admitting: Nurse Practitioner

## 2022-08-06 NOTE — Progress Notes (Unsigned)
Tomasita Morrow, NP-C Phone: [phone]  Rebecca Evans is a 37 y.o. female who presents today for transfer of care and annual exam.  Patient reports increased bilateral lower extremity edema for the last few months. She was seen by her Ob-Gyn who started her on Lasix. She reports it has not helped much. She does wear compression stockings which do not help either. She reports it is worse in the evenings but is not relieved with elevation of legs. Denies chest pain. Denies shortness of breath. Denies leg pain or warmth.  She also reports recently seeing a functional medication doctor who did lab work on her and found multiple food sensitivities, such as wheat, oat, casein and dairy. Lab work reviewed. Patient would like to see an allergy specialist to determine if she has true allergies to these foods. She has a history of elevated WBC and positive ANA and is wondering if these are due to dietary sensitivities.   HYPERLIPIDEMIA Symptoms Chest pain on exertion:  No   Leg claudication:   No Medications: Compliance- Diet controlled Right upper quadrant pain- No  Muscle aches- No Lipid Panel     Component Value Date/Time   CHOL 235 (H) 07/26/2021 0919   CHOL 231 (H) 06/24/2019 0835   TRIG 166.0 (H) 07/26/2021 0919   HDL 46.00 07/26/2021 0919   HDL 43 06/24/2019 0835   CHOLHDL 5 07/26/2021 0919   VLDL 33.2 07/26/2021 0919   LDLCALC 156 (H) 07/26/2021 0919   LDLCALC 156 (H) 06/24/2019 0835   LABVLDL 32 06/24/2019 0835    Diet: Oat free- meal preps, does not eat a lot of vegetables.  Exercise: She does not exercise but reports being very active in her daily job. Pap smear: Hysterectomy Family history-  Colon cancer: No  Breast cancer: No  Ovarian cancer: No Menses: Hysterectomy Sexually active: Yes Vaccines-   Flu: UTD  Tetanus: 02/09/2015  COVID19: x 3 HIV screening: Negative Hep C Screening: Negative Tobacco use: No Alcohol use: No Illicit Drug use: No Dentist:  Yes Ophthalmology: Yes   Social History   Tobacco Use  Smoking Status Never  Smokeless Tobacco Never    Current Outpatient Medications on File Prior to Visit  Medication Sig Dispense Refill   cetirizine (ZYRTEC) 10 MG tablet Take 10 mg by mouth daily as needed for allergies.     estradiol (VIVELLE-DOT) 0.1 MG/24HR patch UNWRAP AND APPLY 1 PATCH TO SKIN TWICE WEEKLY. 12 patch 0   No current facility-administered medications on file prior to visit.     ROS see history of present illness  Objective  Physical Exam Vitals:   08/07/22 0914  BP: 118/66  Pulse: 74  Temp: 97.9 F (36.6 C)  SpO2: 98%    BP Readings from Last 3 Encounters:  08/07/22 118/66  07/19/22 121/71  07/26/21 110/70   Wt Readings from Last 3 Encounters:  08/07/22 205 lb 9.6 oz (93.3 kg)  07/19/22 207 lb (93.9 kg)  07/26/21 191 lb 3.2 oz (86.7 kg)    Physical Exam Constitutional:      General: She is not in acute distress.    Appearance: Normal appearance.  HENT:     Head: Normocephalic.     Right Ear: Tympanic membrane normal.     Left Ear: Tympanic membrane normal.     Nose: Nose normal.     Mouth/Throat:     Mouth: Mucous membranes are moist.     Pharynx: Oropharynx is clear.  Eyes:  Conjunctiva/sclera: Conjunctivae normal.     Pupils: Pupils are equal, round, and reactive to light.  Neck:     Thyroid: No thyromegaly.  Cardiovascular:     Rate and Rhythm: Normal rate and regular rhythm.     Heart sounds: Normal heart sounds.  Pulmonary:     Effort: Pulmonary effort is normal.     Breath sounds: Normal breath sounds.  Abdominal:     General: Abdomen is flat. Bowel sounds are normal.     Palpations: Abdomen is soft. There is no mass.     Tenderness: There is no abdominal tenderness.  Musculoskeletal:        General: Normal range of motion.     Right lower leg: 2+ Pitting Edema present.     Left lower leg: 2+ Pitting Edema present.  Lymphadenopathy:     Cervical: No cervical  adenopathy.  Skin:    General: Skin is warm and dry.     Findings: No rash.  Neurological:     General: No focal deficit present.     Mental Status: She is alert.  Psychiatric:        Mood and Affect: Mood normal.        Behavior: Behavior normal.    Assessment/Plan: Please see individual problem list.  Preventative health care Assessment & Plan: Physical exam complete. Lab work as outlined. Will contact patient with results. Vaccines UTD. HIV/Hep C screening- negative. Recommended annual follow ups with Dentist and Ophthalmology. Advised healthy diet and exercise.   Orders: -     CBC with Differential/Platelet -     Comprehensive metabolic panel -     Lipid panel -     TSH -     Hemoglobin A1c  Bilateral leg edema Assessment & Plan: 2+ pitting on exam, patient reports worsening in the evenings. Will refill Lasix 20 mg and have patient continue daily. Lab work as outlined. Will refer to Cardiology for further evaluation. Encouraged decreased sodium intake and to elevated legs in the evenings.   Orders: -     Furosemide; Take 1 tablet (20 mg total) by mouth daily.  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 Autoimmune work up by Rheumatology negative. Will check CBC today and monitor. Will refer back to Hematology if needed.   Orders: -     CBC with Differential/Platelet -     Ambulatory referral to Allergy  ANA positive Assessment & Plan: Work up by Rheumatology last year negative. Will monitor. Patient concerned could be due to food sensitivities/allergies. Will refer for formal work up.   Orders: -     Ambulatory referral to Allergy  Hyperlipidemia, unspecified hyperlipidemia type Assessment & Plan: Chronic. Currently diet controlled. Will check lipids today. Encouraged healthy diet and  exercise.   Orders: -     Lipid panel  Prediabetes Assessment & Plan: Chronic. Last A1c 5.6. Stable with diet control. Will check A1c today. Advised healthy diet and exercise.   Orders: -     Hemoglobin A1c  Anxiety Assessment & Plan: Chronic. Stable on Lexapro 10 mg daily. Continue. Refills sent.   Orders: -     Escitalopram Oxalate; TAKE 1 TABLET (10 MG TOTAL) BY MOUTH DAILY.  Dispense: 90 tablet; Refill: 3  Gastroesophageal reflux disease, unspecified whether  esophagitis present Assessment & Plan: Chronic. Stable on Prevacid 30 mg daily. Continue. Encouraged to monitor diet for triggers.   Orders: -     Lansoprazole; TAKE 1 CAPSULE BY MOUTH AT BEDTIME 30 MINUTES BEFORE FOOD  Dispense: 90 capsule; Refill: 3 -     Ambulatory referral to Allergy  Vitamin D deficiency -     VITAMIN D 25 Hydroxy (Vit-D Deficiency, Fractures)  Thyroid disorder screen -     TSH    Return in about 6 months (around 02/07/2023), or if symptoms worsen or fail to improve, for Follow up.   Tomasita Morrow, NP-C [place]

## 2022-08-07 ENCOUNTER — Encounter: Payer: Self-pay | Admitting: Nurse Practitioner

## 2022-08-07 ENCOUNTER — Ambulatory Visit: Payer: BC Managed Care – PPO | Admitting: Nurse Practitioner

## 2022-08-07 VITALS — BP 118/66 | HR 74 | Temp 97.9°F | Ht 65.0 in | Wt 205.6 lb

## 2022-08-07 DIAGNOSIS — D72820 Lymphocytosis (symptomatic): Secondary | ICD-10-CM | POA: Diagnosis not present

## 2022-08-07 DIAGNOSIS — E785 Hyperlipidemia, unspecified: Secondary | ICD-10-CM | POA: Diagnosis not present

## 2022-08-07 DIAGNOSIS — E559 Vitamin D deficiency, unspecified: Secondary | ICD-10-CM

## 2022-08-07 DIAGNOSIS — R600 Localized edema: Secondary | ICD-10-CM

## 2022-08-07 DIAGNOSIS — Z1329 Encounter for screening for other suspected endocrine disorder: Secondary | ICD-10-CM

## 2022-08-07 DIAGNOSIS — R7303 Prediabetes: Secondary | ICD-10-CM

## 2022-08-07 DIAGNOSIS — I890 Lymphedema, not elsewhere classified: Secondary | ICD-10-CM | POA: Insufficient documentation

## 2022-08-07 DIAGNOSIS — R768 Other specified abnormal immunological findings in serum: Secondary | ICD-10-CM

## 2022-08-07 DIAGNOSIS — Z0000 Encounter for general adult medical examination without abnormal findings: Secondary | ICD-10-CM

## 2022-08-07 DIAGNOSIS — K219 Gastro-esophageal reflux disease without esophagitis: Secondary | ICD-10-CM

## 2022-08-07 DIAGNOSIS — F419 Anxiety disorder, unspecified: Secondary | ICD-10-CM

## 2022-08-07 LAB — CBC WITH DIFFERENTIAL/PLATELET
Basophils Absolute: 0 10*3/uL (ref 0.0–0.1)
Basophils Relative: 0.5 % (ref 0.0–3.0)
Eosinophils Absolute: 0.3 10*3/uL (ref 0.0–0.7)
Eosinophils Relative: 2.7 % (ref 0.0–5.0)
HCT: 39.5 % (ref 36.0–46.0)
Hemoglobin: 13.3 g/dL (ref 12.0–15.0)
Lymphocytes Relative: 42.6 % (ref 12.0–46.0)
Lymphs Abs: 4 10*3/uL (ref 0.7–4.0)
MCHC: 33.6 g/dL (ref 30.0–36.0)
MCV: 84.8 fl (ref 78.0–100.0)
Monocytes Absolute: 0.3 10*3/uL (ref 0.1–1.0)
Monocytes Relative: 2.7 % — ABNORMAL LOW (ref 3.0–12.0)
Neutro Abs: 4.9 10*3/uL (ref 1.4–7.7)
Neutrophils Relative %: 51.5 % (ref 43.0–77.0)
Platelets: 370 10*3/uL (ref 150.0–400.0)
RBC: 4.65 Mil/uL (ref 3.87–5.11)
RDW: 13.7 % (ref 11.5–15.5)
WBC: 9.5 10*3/uL (ref 4.0–10.5)

## 2022-08-07 LAB — C-REACTIVE PROTEIN: CRP: 2.4 mg/dL (ref 0.5–20.0)

## 2022-08-07 LAB — COMPREHENSIVE METABOLIC PANEL
ALT: 20 U/L (ref 0–35)
AST: 15 U/L (ref 0–37)
Albumin: 3.8 g/dL (ref 3.5–5.2)
Alkaline Phosphatase: 123 U/L — ABNORMAL HIGH (ref 39–117)
BUN: 9 mg/dL (ref 6–23)
CO2: 27 mEq/L (ref 19–32)
Calcium: 9.2 mg/dL (ref 8.4–10.5)
Chloride: 103 mEq/L (ref 96–112)
Creatinine, Ser: 0.58 mg/dL (ref 0.40–1.20)
GFR: 116.87 mL/min (ref >60.00)
Glucose, Bld: 86 mg/dL (ref 70–99)
Potassium: 4.2 mEq/L (ref 3.5–5.1)
Sodium: 138 mEq/L (ref 135–145)
Total Bilirubin: 0.5 mg/dL (ref 0.2–1.2)
Total Protein: 6.9 g/dL (ref 6.0–8.3)

## 2022-08-07 LAB — LIPID PANEL
Cholesterol: 253 mg/dL — ABNORMAL HIGH (ref 0–200)
HDL: 46.1 mg/dL (ref >39.00)
LDL Cholesterol: 179 mg/dL — ABNORMAL HIGH (ref 0–99)
NonHDL: 207.06
Total CHOL/HDL Ratio: 5
Triglycerides: 142 mg/dL (ref 0.0–149.0)
VLDL: 28.4 mg/dL (ref 0.0–40.0)

## 2022-08-07 LAB — TSH: TSH: 1.16 u[IU]/mL (ref 0.35–5.50)

## 2022-08-07 LAB — SEDIMENTATION RATE: Sed Rate: 78 mm/hr — ABNORMAL HIGH (ref 0–20)

## 2022-08-07 LAB — HEMOGLOBIN A1C: Hgb A1c MFr Bld: 5.8 % (ref 4.6–6.5)

## 2022-08-07 LAB — VITAMIN D 25 HYDROXY (VIT D DEFICIENCY, FRACTURES): VITD: 48.43 ng/mL (ref 30.00–100.00)

## 2022-08-07 MED ORDER — ESCITALOPRAM OXALATE 10 MG PO TABS: ORAL_TABLET | Freq: Every day | ORAL | 3 refills | Status: DC

## 2022-08-07 MED ORDER — LANSOPRAZOLE 30 MG PO CPDR: DELAYED_RELEASE_CAPSULE | ORAL | 3 refills | Status: DC

## 2022-08-07 MED ORDER — FUROSEMIDE 20 MG PO TABS: 20.0000 mg | ORAL_TABLET | Freq: Every day | ORAL | 2 refills | Status: DC

## 2022-08-07 NOTE — Assessment & Plan Note (Signed)
Chronic. Stable on Lexapro 10 mg daily. Continue. Refills sent.

## 2022-08-07 NOTE — Assessment & Plan Note (Signed)
Had work up by Hematology, believed due to autoimmune disease. Autoimmune work up by Rheumatology negative. Will check CBC today and monitor. Will refer back to Hematology if needed.

## 2022-08-07 NOTE — Assessment & Plan Note (Addendum)
Work up by Rheumatology last year negative. Will monitor. Patient concerned could be due to food sensitivities/allergies. Will refer for formal work up.

## 2022-08-07 NOTE — Assessment & Plan Note (Signed)
Physical exam complete. Lab work as outlined. Will contact patient with results. Vaccines UTD. HIV/Hep C screening- negative. Recommended annual follow ups with Dentist and Ophthalmology. Advised healthy diet and exercise.

## 2022-08-07 NOTE — Assessment & Plan Note (Addendum)
2+ pitting on exam, patient reports worsening in the evenings. Will refill Lasix 20 mg and have patient continue daily. Lab work as outlined. Will refer to Cardiology for further evaluation. Encouraged decreased sodium intake and to elevated legs in the evenings.

## 2022-08-07 NOTE — Assessment & Plan Note (Addendum)
Chronic. Currently diet controlled. Will check lipids today. Encouraged healthy diet and exercise.

## 2022-08-07 NOTE — Assessment & Plan Note (Signed)
Chronic. Stable on Prevacid 30 mg daily. Continue. Encouraged to monitor diet for triggers.

## 2022-08-07 NOTE — Assessment & Plan Note (Signed)
Chronic. Last A1c 5.6. Stable with diet control. Will check A1c today. Advised healthy diet and exercise.

## 2022-08-08 ENCOUNTER — Other Ambulatory Visit: Payer: Self-pay | Admitting: Nurse Practitioner

## 2022-08-08 DIAGNOSIS — E785 Hyperlipidemia, unspecified: Secondary | ICD-10-CM

## 2022-08-08 MED ORDER — ROSUVASTATIN CALCIUM 10 MG PO TABS: 10.0000 mg | ORAL_TABLET | Freq: Every day | ORAL | 3 refills | Status: DC

## 2022-08-08 NOTE — Telephone Encounter (Signed)
Labs were faxed to given fax number on pt on cover letter I told them to let us know if they need anything else

## 2022-08-09 ENCOUNTER — Other Ambulatory Visit: Payer: Self-pay | Admitting: Nurse Practitioner

## 2022-08-09 DIAGNOSIS — R748 Abnormal levels of other serum enzymes: Secondary | ICD-10-CM

## 2022-08-15 ENCOUNTER — Ambulatory Visit
Admission: RE | Admit: 2022-08-15 | Discharge: 2022-08-15 | Disposition: A | Discharge status: Home / Self Care | Payer: BC Managed Care – PPO | Source: Ambulatory Visit | Attending: Nurse Practitioner | Admitting: Nurse Practitioner

## 2022-08-15 DIAGNOSIS — R748 Abnormal levels of other serum enzymes: Secondary | ICD-10-CM | POA: Diagnosis not present

## 2022-08-20 ENCOUNTER — Other Ambulatory Visit: Payer: Self-pay | Admitting: Obstetrics and Gynecology

## 2022-08-20 DIAGNOSIS — E8940 Asymptomatic postprocedural ovarian failure: Secondary | ICD-10-CM

## 2022-08-23 ENCOUNTER — Encounter: Payer: Self-pay | Admitting: Nurse Practitioner

## 2022-08-24 ENCOUNTER — Other Ambulatory Visit: Payer: Self-pay | Admitting: Nurse Practitioner

## 2022-08-24 DIAGNOSIS — R600 Localized edema: Secondary | ICD-10-CM

## 2022-08-24 DIAGNOSIS — E669 Obesity, unspecified: Secondary | ICD-10-CM | POA: Insufficient documentation

## 2022-08-28 ENCOUNTER — Other Ambulatory Visit: Payer: Self-pay | Admitting: Nurse Practitioner

## 2022-08-28 ENCOUNTER — Other Ambulatory Visit: Payer: Self-pay

## 2022-08-28 DIAGNOSIS — E669 Obesity, unspecified: Secondary | ICD-10-CM

## 2022-08-28 MED ORDER — SEMAGLUTIDE-WEIGHT MANAGEMENT 0.5 MG/0.5ML ~~LOC~~ SOAJ: 0.5000 mg | SUBCUTANEOUS | 0 refills | Status: AC

## 2022-08-28 MED ORDER — SEMAGLUTIDE-WEIGHT MANAGEMENT 1 MG/0.5ML ~~LOC~~ SOAJ: 1.0000 mg | SUBCUTANEOUS | 0 refills | Status: DC

## 2022-08-28 MED ORDER — SEMAGLUTIDE-WEIGHT MANAGEMENT 0.25 MG/0.5ML ~~LOC~~ SOAJ: 0.2500 mg | SUBCUTANEOUS | 0 refills | Status: AC

## 2022-08-28 NOTE — Assessment & Plan Note (Addendum)
Patient would like to proceed with [facility] despite allergy to Saxenda. Discussed potential for same reaction as they are in the same drug class. Will start the patient on Wegovy. Counseled on the risk of pancreatitis and gallbladder disease. Discussed the risk of nausea. Advised to discontinue the [facility] and contact us immediately if they develop abdominal pain. If they develop excessive nausea they will contact us right away. Discussed that medullary thyroid cancer has been seen in rats studies. The patient confirmed no personal or family history of thyroid cancer, parathyroid cancer, or adrenal gland cancer. Discussed that we thus far have not seen medullary thyroid cancer result from use of this type of medication in humans. Advised to monitor the thyroid area and contact us for any lumps, swelling, trouble swallowing, or any other changes in this area. Discussed goal weight loss of 1 to 2 pounds a week while on this medication. Discussed importance of healthy diet, exercise and lifestyle modifications with medication.

## 2022-08-29 ENCOUNTER — Other Ambulatory Visit: Payer: Self-pay

## 2022-08-29 NOTE — Progress Notes (Signed)
MRN : [id number]  Rebecca Evans is a 37 y.o. (1985/12/05) female who presents with chief complaint of legs swell.  History of Present Illness:  Patient is seen for evaluation of leg swelling. The patient first noticed the swelling remotely but is now concerned because of a significant increase in the overall edema. The swelling isn't associated with significant pain.  There has been an increasing amount of  discoloration noted by the patient. The patient notes that in the morning the legs are improved but they steadily worsened throughout the course of the day. Elevation seems to make the swelling of the legs better, dependency makes them much worse.   There is no history of ulcerations associated with the swelling.   The patient denies any recent changes in their medications.  The patient has not been wearing graduated compression.  The patient has no had any past angiography, interventions or vascular surgery.  The patient denies a history of DVT or PE. There is no prior history of phlebitis. There is no history of primary lymphedema.  There is no history of radiation treatment to the groin or pelvis No history of malignancies. No history of trauma or groin or pelvic surgery. No history of foreign travel or parasitic infections area   No outpatient medications have been marked as taking for the 08/30/22 encounter (Appointment) with Delana Meyer, Dolores Lory, MD.    Past Medical History:  Diagnosis Date   Anxiety    COVID-19    06/2020, 04/23/21   GERD (gastroesophageal reflux disease)    History of kidney stones    h/o   Leukocytosis 08/2020   Lymphocytosis 08/2020   Vitamin D deficiency     Past Surgical History:  Procedure Laterality Date   DIAGNOSTIC LAPAROSCOPY     ESOPHAGOGASTRODUODENOSCOPY (EGD) WITH PROPOFOL N/A 06/19/2016   Procedure: ESOPHAGOGASTRODUODENOSCOPY (EGD) WITH PROPOFOL;  Surgeon: Lucilla Lame, MD;  Location: [facility];  Service: Endoscopy;   Laterality: N/A;   LAPAROSCOPIC VAGINAL HYSTERECTOMY WITH SALPINGO OOPHORECTOMY Bilateral 05/13/2017   Procedure: LAPAROSCOPIC ASSISTED VAGINAL HYSTERECTOMY WITH BILATERAL SALPINGO OOPHORECTOMY;  Surgeon: Brayton Mars, MD;  Location: [facility];  Service: Gynecology;  Laterality: Bilateral;   REMOVAL OF DRUG DELIVERY IMPLANT Left 05/13/2017   Procedure: REMOVAL OF DRUG DELIVERY IMPLANT, LEFT ARM;  Surgeon: Brayton Mars, MD;  Location: [facility];  Service: Gynecology;  Laterality: Left;   TONSILLECTOMY      Social History Social History   Tobacco Use   Smoking status: Never   Smokeless tobacco: Never  Vaping Use   Vaping Use: Never used  Substance Use Topics   Alcohol use: No   Drug use: No    Family History Family History  Problem Relation Age of Onset   Diabetes Maternal Grandmother    Diabetes Maternal Grandfather    Diabetes Mother        type 2   Other Mother        abnormal mammogram 07/2020    Diabetes Sister        type 2    Asthma Daughter    Diabetes Maternal Aunt        type 2   Asthma Daughter    Diabetes Maternal Aunt        type 2    Allergies  Allergen Reactions   Liraglutide (Weight Management) Other (See Comments)    Diarrhea with mucus   Saxenda [Liraglutide -Weight Management]     Diarrhea with mucus  Wellbutrin [Bupropion]     itching     REVIEW OF SYSTEMS (Negative unless checked)  Constitutional: [] Weight loss  [] Fever  [] Chills Cardiac: [] Chest pain   [] Chest pressure   [] Palpitations   [] Shortness of breath when laying flat   [] Shortness of breath with exertion. Vascular:  [] Pain in legs with walking   [x] Pain in legs with standing  [] History of DVT   [] Phlebitis   [x] Swelling in legs   [] Varicose veins   [] Non-healing ulcers Pulmonary:   [] Uses home oxygen   [] Productive cough   [] Hemoptysis   [] Wheeze  [] COPD   [] Asthma Neurologic:  [] Dizziness   [] Seizures   [] History of stroke   [] History of TIA  [] Aphasia   [] Vissual  changes   [] Weakness or numbness in arm   [] Weakness or numbness in leg Musculoskeletal:   [] Joint swelling   [] Joint pain   [] Low back pain Hematologic:  [] Easy bruising  [] Easy bleeding   [] Hypercoagulable state   [] Anemic Gastrointestinal:  [] Diarrhea   [] Vomiting  [] Gastroesophageal reflux/heartburn   [] Difficulty swallowing. Genitourinary:  [] Chronic kidney disease   [] Difficult urination  [] Frequent urination   [] Blood in urine Skin:  [] Rashes   [] Ulcers  Psychological:  [] History of anxiety   []  History of major depression.  Physical Examination  There were no vitals filed for this visit. There is no height or weight on file to calculate BMI. Gen: WD/WN, NAD Head: [place]/AT, No temporalis wasting.  Ear/Nose/Throat: Hearing grossly intact, nares w/o erythema or drainage, pinna without lesions Eyes: PER, EOMI, sclera nonicteric.  Neck: Supple, no gross masses.  No JVD.  Pulmonary:  Good air movement, no audible wheezing, no use of accessory muscles.  Cardiac: RRR, precordium not hyperdynamic. Vascular:  scattered varicosities present bilaterally.  Mild venous stasis changes to the legs bilaterally.  3-4+ soft pitting edema, CEAP [id number]  Vessel Right Left  Radial Palpable Palpable  Gastrointestinal: soft, non-distended. No guarding/no peritoneal signs.  Musculoskeletal: M/S 5/5 throughout.  No deformity.  Neurologic: CN 2-12 intact. Pain and light touch intact in extremities.  Symmetrical.  Speech is fluent. Motor exam as listed above. Psychiatric: Judgment intact, Mood & affect appropriate for pt's clinical situation. Dermatologic: Venous rashes no ulcers noted.  No changes consistent with cellulitis. Lymph : No lichenification or skin changes of chronic lymphedema.  CBC Lab Results  Component Value Date   WBC 9.5 08/07/2022   HGB 13.3 08/07/2022   HCT 39.5 08/07/2022   MCV 84.8 08/07/2022   PLT 370.0 08/07/2022    BMET    Component Value Date/Time   NA 138 08/07/2022  0950   NA 139 03/18/2022 0000   K 4.2 08/07/2022 0950   CL 103 08/07/2022 0950   CO2 27 08/07/2022 0950   GLUCOSE 86 08/07/2022 0950   BUN 9 08/07/2022 0950   BUN 13 03/18/2022 0000   CREATININE 0.58 08/07/2022 0950   CALCIUM 9.2 08/07/2022 0950   GFRNONAA 121 06/24/2019 0835   GFRAA 139 06/24/2019 0835   CrCl cannot be calculated (Patient's most recent lab result is older than the maximum 21 days allowed.).  COAG No results found for: "INR", "PROTIME"  Radiology US Abdomen Limited RUQ (LIVER/GB)  Result Date: 08/15/2022 CLINICAL DATA:  Elevated alkaline phosphatase. EXAM: ULTRASOUND ABDOMEN LIMITED RIGHT UPPER QUADRANT COMPARISON:  June 12, 2016 FINDINGS: Gallbladder: No gallstones or wall thickening visualized. No sonographic Murphy sign noted by sonographer. Common bile duct: Diameter: 4.8 mm Liver: No focal lesion identified. Diffuse increased echotexture  of the liver. Portal vein is patent on color Doppler imaging with normal direction of blood flow towards the liver. Other: None. IMPRESSION: 1. Normal gallbladder. 2. Diffuse increased echotexture of the liver. This is nonspecific but can be seen in fatty infiltration of liver. Electronically Signed   By: Abelardo Diesel M.D.   On: 08/15/2022 08:51     Assessment/Plan 1. Lymphedema Recommend:  I have had a long discussion with the patient regarding swelling and why it  causes symptoms.  Patient will begin wearing graduated compression on a daily basis a prescription was given. The patient will  wear the stockings first thing in the morning and removing them in the evening. The patient is instructed specifically not to sleep in the stockings.   In addition, behavioral modification will be initiated.  This will include frequent elevation, use of over the counter pain medications and exercise such as walking.  Consideration for a lymph pump will also be made based upon the effectiveness of conservative therapy.  This would help to  improve the edema control and prevent sequela such as ulcers and infections   Patient has had an ultrasound of the venous system which is negative for superficial reflux and demonstrates a normal deep venous system bilaterally.  The patient will follow-up with me in 2 months.   2. Hyperlipidemia, unspecified hyperlipidemia type Continue statin as ordered and reviewed, no changes at this time  3. Gastroesophageal reflux disease, unspecified whether esophagitis present Continue PPI as already ordered, this medication has been reviewed and there are no changes at this time.  Avoidence of caffeine and alcohol  Moderate elevation of the head of the bed     Hortencia Pilar, MD  08/29/2022 8:28 AM

## 2022-08-30 ENCOUNTER — Ambulatory Visit (INDEPENDENT_AMBULATORY_CARE_PROVIDER_SITE_OTHER): Payer: BC Managed Care – PPO | Admitting: Vascular Surgery

## 2022-08-30 ENCOUNTER — Encounter (INDEPENDENT_AMBULATORY_CARE_PROVIDER_SITE_OTHER): Payer: Self-pay | Admitting: Vascular Surgery

## 2022-08-30 ENCOUNTER — Other Ambulatory Visit (HOSPITAL_COMMUNITY): Payer: Self-pay

## 2022-08-30 ENCOUNTER — Telehealth: Payer: Self-pay

## 2022-08-30 VITALS — BP 123/85 | HR 86 | Resp 18 | Ht 64.0 in | Wt 209.0 lb

## 2022-08-30 DIAGNOSIS — E785 Hyperlipidemia, unspecified: Secondary | ICD-10-CM | POA: Diagnosis not present

## 2022-08-30 DIAGNOSIS — I890 Lymphedema, not elsewhere classified: Secondary | ICD-10-CM

## 2022-08-30 DIAGNOSIS — K219 Gastro-esophageal reflux disease without esophagitis: Secondary | ICD-10-CM

## 2022-08-30 NOTE — Telephone Encounter (Signed)
Patient states her pharmacy, [facility] in [place], has the Semaglutide-Weight Management 0.25 MG/0.5ML SOAJ, but they are waiting for prior authorization from [place].  Patient states she was hoping to get it before the Easter break.

## 2022-08-30 NOTE — Telephone Encounter (Signed)
I sent pt a mychart msg informing her that I have sent her request for [facility] PA to our pharmacy/ PA team on 3/27 and taht I will update her once we get an update.

## 2022-08-31 ENCOUNTER — Encounter (INDEPENDENT_AMBULATORY_CARE_PROVIDER_SITE_OTHER): Payer: Self-pay | Admitting: Vascular Surgery

## 2022-08-31 NOTE — Telephone Encounter (Signed)
Patient Advocate Encounter   Received notification from [facility] [place] that prior authorization for [facility] is required.   PA submitted on 08/31/2022 Key [id number]

## 2022-11-01 ENCOUNTER — Ambulatory Visit (INDEPENDENT_AMBULATORY_CARE_PROVIDER_SITE_OTHER): Payer: BC Managed Care – PPO | Admitting: Vascular Surgery

## 2022-11-15 ENCOUNTER — Encounter: Payer: Self-pay | Admitting: Nurse Practitioner

## 2022-11-19 MED ORDER — SEMAGLUTIDE-WEIGHT MANAGEMENT 1.7 MG/0.75ML ~~LOC~~ SOAJ: 1.7000 mg | SUBCUTANEOUS | 0 refills | Status: DC

## 2022-12-04 ENCOUNTER — Encounter: Payer: Self-pay | Admitting: Nurse Practitioner

## 2022-12-04 MED ORDER — SEMAGLUTIDE-WEIGHT MANAGEMENT 2.4 MG/0.75ML ~~LOC~~ SOAJ: 2.4000 mg | SUBCUTANEOUS | 3 refills | Status: DC

## 2023-01-21 ENCOUNTER — Encounter: Payer: Self-pay | Admitting: Nurse Practitioner

## 2023-01-24 ENCOUNTER — Telehealth: Payer: Self-pay

## 2023-01-24 ENCOUNTER — Other Ambulatory Visit (HOSPITAL_COMMUNITY): Payer: Self-pay

## 2023-01-24 NOTE — Telephone Encounter (Signed)
PA needed for [facility]    Provider Bethanie Dicker

## 2023-01-24 NOTE — Telephone Encounter (Signed)
Pt's current weight is needed for PA please and thanks

## 2023-01-24 NOTE — Telephone Encounter (Signed)
Pharmacy Patient Advocate Encounter   Received notification from CoverMyMeds that prior authorization for [facility] 2.4mg /0.58ml is required/requested.   Insurance verification completed.   The patient is insured through [facility] .   Per test claim: PA required; PA submitted to [employer] via CoverMyMeds Key/confirmation #/EOC [id number] Status is pending

## 2023-01-28 ENCOUNTER — Other Ambulatory Visit (HOSPITAL_COMMUNITY): Payer: Self-pay

## 2023-01-28 NOTE — Telephone Encounter (Signed)
Pharmacy Patient Advocate Encounter  Received notification from [facility] that Prior Authorization for [facility] 2.4MG /0.75ML auto-injectors has been APPROVED from 01/26/23 to 01/24/24   PA #/Case ID/Reference #: [id number]

## 2023-04-17 ENCOUNTER — Encounter: Payer: Self-pay | Admitting: Nurse Practitioner

## 2023-04-17 MED ORDER — SEMAGLUTIDE-WEIGHT MANAGEMENT 2.4 MG/0.75ML ~~LOC~~ SOAJ: 2.4000 mg | SUBCUTANEOUS | 3 refills | Status: DC

## 2023-06-10 ENCOUNTER — Other Ambulatory Visit (HOSPITAL_COMMUNITY): Payer: Self-pay

## 2023-06-18 ENCOUNTER — Telehealth: Payer: Self-pay

## 2023-06-18 NOTE — Telephone Encounter (Signed)
 Pt updating her new insurance card to [employer] PA needed for [facility] pt still taking

## 2023-06-18 NOTE — Telephone Encounter (Signed)
 Pharmacy Patient Advocate Encounter   Received notification from CoverMyMeds that prior authorization for Wegovy  2.4MG /0.75ML auto-injectors is required/requested.   Insurance verification completed.   The patient is insured through [facility] .   Per test claim: PA Paused

## 2023-06-21 ENCOUNTER — Other Ambulatory Visit (HOSPITAL_COMMUNITY): Payer: Self-pay

## 2023-06-21 NOTE — Telephone Encounter (Signed)
Pharmacy Patient Advocate Encounter   Received notification from CoverMyMeds that prior authorization for Wegovy 2.4MG /0.75ML auto-injectors is required/requested.   Insurance verification completed.   The patient is insured through [facility] .   Per test claim: PA required; PA submitted to above mentioned insurance via [employer] PA Key/confirmation #/EOC [id number] Status is pending

## 2023-06-24 NOTE — Telephone Encounter (Signed)
This has been submitted and documented in separate encounter. Signing off on duplicate encounter

## 2023-06-25 NOTE — Telephone Encounter (Signed)
Pharmacy Patient Advocate Encounter  Insurance is requiring clinical notes within 3 months to support the diagnosis for use of the product, current BMI and/or height and weight, and baseline BMI prior to initiation of Wegovy.  Please have patient come in for these to be done. Thank you.   No deadline given on fax but the sooner the better as last office visit is 08-07-2022

## 2023-07-16 ENCOUNTER — Encounter: Payer: Self-pay | Admitting: Nurse Practitioner

## 2023-07-16 ENCOUNTER — Ambulatory Visit: Payer: BC Managed Care – PPO | Admitting: Nurse Practitioner

## 2023-07-16 ENCOUNTER — Telehealth: Payer: Self-pay

## 2023-07-16 VITALS — BP 110/80 | HR 77 | Temp 98.0°F | Ht 64.0 in | Wt 176.8 lb

## 2023-07-16 DIAGNOSIS — K219 Gastro-esophageal reflux disease without esophagitis: Secondary | ICD-10-CM

## 2023-07-16 DIAGNOSIS — I890 Lymphedema, not elsewhere classified: Secondary | ICD-10-CM

## 2023-07-16 DIAGNOSIS — E669 Obesity, unspecified: Secondary | ICD-10-CM | POA: Diagnosis not present

## 2023-07-16 DIAGNOSIS — R7303 Prediabetes: Secondary | ICD-10-CM

## 2023-07-16 DIAGNOSIS — E785 Hyperlipidemia, unspecified: Secondary | ICD-10-CM | POA: Diagnosis not present

## 2023-07-16 MED ORDER — ZEPBOUND 2.5 MG/0.5ML ~~LOC~~ SOAJ
2.5000 mg | SUBCUTANEOUS | 0 refills | Status: DC
Start: 2023-07-16 — End: 2023-09-03

## 2023-07-16 MED ORDER — ZEPBOUND 5 MG/0.5ML ~~LOC~~ SOAJ
5.0000 mg | SUBCUTANEOUS | 0 refills | Status: DC
Start: 2023-07-16 — End: 2023-09-10

## 2023-07-16 NOTE — Assessment & Plan Note (Signed)
Uncontrolled. Patient was started on Crestor but is no longer taking. Suspect improvement with weight loss after being on Wegovy and continued improvement with Zepbound. Will check lipid panel at next appointment. Encouraged healthy diet and exercise.

## 2023-07-16 NOTE — Progress Notes (Signed)
Rebecca Dicker, NP-C Phone: [phone]  Rebecca Evans Rebecca Evans is a 38 y.o. female who presents today for weight management.  Discussed the use of AI scribe software for clinical note transcription with the patient, who gave verbal consent to proceed.  History of Present Illness   Rebecca Evans Rebecca Evans is a 38 year old female who presents with weight management concerns.  She has been managing her weight with Wegovy, reaching the maximum dose and initially losing approximately 52 pounds, from 217 pounds to 165 pounds. Since discontinuing Wegovy at the beginning of the year, she has gained some weight back, currently weighing 176 pounds, but her weight has stabilized. She is interested in switching to Zepbound for weight management, having heard positive feedback from colleagues and believing it is covered by her insurance. She plans to have regular weight checks every three months as required by her new insurance.  While on Wegovy, she experienced side effects such as acid reflux and a sensation of fullness after the injection, which typically subsided by the next day. She also recalls having severe and persistent diarrhea with Saxenda, leading to its discontinuation.  She has a history of gut issues, including a previous diagnosis of gut yeast with elevated levels two years ago, treated with nystatin. She also has multiple food allergies, including oats, gluten, and casein, identified by a functional medicine doctor. Despite dietary changes, her symptoms did not improve significantly. She experiences bloating and changes in bowel habits, with consistently loose stools. She suffers from severe acid reflux if she misses her morning medication, which becomes unbearable by lunchtime. She has not seen a gastroenterologist for these issues.  She has a positive ANA and elevated sedimentation rate, leading to consultations with rheumatology, but no definitive diagnosis has been made. She has experienced leg  swelling in the past, evaluated by cardiology and vascular specialists, resulting in a diagnosis of lymphedema. The swelling improved while on Wegovy but has returned slightly since discontinuing the medication.  She maintains a healthy diet and exercises regularly, including walking. She has a goal weight of 120 pounds, having previously weighed 112 pounds before marriage and childbirth.      Social History   Tobacco Use  Smoking Status Never  Smokeless Tobacco Never    Current Outpatient Medications on File Prior to Visit  Medication Sig Dispense Refill   cetirizine (ZYRTEC) 10 MG tablet Take 10 mg by mouth daily as needed for allergies.     escitalopram (LEXAPRO) 10 MG tablet TAKE 1 TABLET (10 MG TOTAL) BY MOUTH DAILY. 90 tablet 3   estradiol (VIVELLE-DOT) 0.1 MG/24HR patch UNWRAP AND APPLY 1 PATCH TO SKIN TWICE A WEEK 8 patch 10   lansoprazole (PREVACID) 30 MG capsule TAKE 1 CAPSULE BY MOUTH AT BEDTIME 30 MINUTES BEFORE FOOD 90 capsule 3   rosuvastatin (CRESTOR) 10 MG tablet Take 1 tablet (10 mg total) by mouth daily. 90 tablet 3   No current facility-administered medications on file prior to visit.     ROS see history of present illness  Objective  Physical Exam Vitals:   07/16/23 1338  BP: 110/80  Pulse: 77  Temp: 98 F (36.7 C)  SpO2: 97%    BP Readings from Last 3 Encounters:  07/16/23 110/80  08/30/22 123/85  08/07/22 118/66   Wt Readings from Last 3 Encounters:  07/16/23 176 lb 12.8 oz (80.2 kg)  08/30/22 209 lb (94.8 kg)  08/07/22 205 lb 9.6 oz (93.3 kg)    Physical Exam Constitutional:  General: She is not in acute distress.    Appearance: Normal appearance.  HENT:     Head: Normocephalic.  Cardiovascular:     Rate and Rhythm: Normal rate and regular rhythm.     Heart sounds: Normal heart sounds.  Pulmonary:     Effort: Pulmonary effort is normal.     Breath sounds: Normal breath sounds.  Skin:    General: Skin is warm and dry.   Neurological:     General: No focal deficit present.     Mental Status: She is alert.  Psychiatric:        Mood and Affect: Mood normal.        Behavior: Behavior normal.     Assessment/Plan: Please see individual problem list.  Obesity (BMI 30-39.9) Assessment & Plan: Weight loss has plateaued on Wegovy after an initial reduction from 217 lbs to 165 lbs. Weight 176 today. There is interest in switching to Zepbound. Discontinue Wegovy and start Zepbound, titrating as tolerated. Counseled on the risk of pancreatitis and gallbladder disease. Discussed the risk of nausea. Advised to discontinue the Zepbound and contact us immediately if they develop abdominal pain. If they develop excessive nausea they will contact us right away. I discussed that medullary thyroid cancer has been seen in rats studies. The patient confirmed no personal or family history of thyroid cancer, parathyroid cancer, or adrenal gland cancer. Discussed that we thus far have not seen medullary thyroid cancer result from use of this type of medication in humans. Advised to monitor the thyroid area and contact us for any lumps, swelling, trouble swallowing, or any other changes in this area.  Discussed goal weight loss of 1 to 2 pounds a week while on this medication. Discussed the importance of healthy diet, exercise and lifestyle modifications even with this medication. Schedule a weight check in 3 months per insurance requirements.  Orders: -     Zepbound; Inject 2.5 mg into the skin once a week.  Dispense: 2 mL; Refill: 0 -     Zepbound; Inject 5 mg into the skin once a week.  Dispense: 2 mL; Refill: 0  Prediabetes Assessment & Plan: Last A1c 5.8. Stable with diet control. Suspect improvement with weight loss after being on Wegovy. Will check A1c at next appointment. Encouraged healthy diet and exercise.   Orders: -     Zepbound; Inject 2.5 mg into the skin once a week.  Dispense: 2 mL; Refill: 0 -     Zepbound; Inject  5 mg into the skin once a week.  Dispense: 2 mL; Refill: 0  Gastroesophageal reflux disease, unspecified whether esophagitis present Assessment & Plan: Bloating, inconsistent bowel movements, and severe acid reflux are reported, with a history of high gut yeast levels. Plan to recheck yeast levels at the next visit. Consider a GI consult for further evaluation if yeast levels are elevated or symptoms persist.   Hyperlipidemia, unspecified hyperlipidemia type Assessment & Plan: Uncontrolled. Patient was started on Crestor but is no longer taking. Suspect improvement with weight loss after being on Wegovy and continued improvement with Zepbound. Will check lipid panel at next appointment. Encouraged healthy diet and exercise.    Lymphedema Assessment & Plan: Evaluated by Cardiology and Vascular. There was improvement with Wegovy, but some symptoms have returned since stopping the medication. Monitor for changes or worsening of symptoms with the switch to Zepbound.     Return in about 4 weeks (around 08/13/2023) for Annual Exam.   Rebecca Dicker, NP-C  [facility] [employer]

## 2023-07-16 NOTE — Assessment & Plan Note (Signed)
Weight loss has plateaued on Wegovy after an initial reduction from 217 lbs to 165 lbs. Weight 176 today. There is interest in switching to Zepbound. Discontinue Wegovy and start Zepbound, titrating as tolerated. Counseled on the risk of pancreatitis and gallbladder disease. Discussed the risk of nausea. Advised to discontinue the Zepbound and contact us immediately if they develop abdominal pain. If they develop excessive nausea they will contact us right away. I discussed that medullary thyroid cancer has been seen in rats studies. The patient confirmed no personal or family history of thyroid cancer, parathyroid cancer, or adrenal gland cancer. Discussed that we thus far have not seen medullary thyroid cancer result from use of this type of medication in humans. Advised to monitor the thyroid area and contact us for any lumps, swelling, trouble swallowing, or any other changes in this area.  Discussed goal weight loss of 1 to 2 pounds a week while on this medication. Discussed the importance of healthy diet, exercise and lifestyle modifications even with this medication. Schedule a weight check in 3 months per insurance requirements.

## 2023-07-16 NOTE — Assessment & Plan Note (Signed)
Evaluated by Cardiology and Vascular. There was improvement with Wegovy, but some symptoms have returned since stopping the medication. Monitor for changes or worsening of symptoms with the switch to Zepbound.

## 2023-07-16 NOTE — Telephone Encounter (Signed)
PA needed for Zepbound

## 2023-07-16 NOTE — Assessment & Plan Note (Signed)
Bloating, inconsistent bowel movements, and severe acid reflux are reported, with a history of high gut yeast levels. Plan to recheck yeast levels at the next visit. Consider a GI consult for further evaluation if yeast levels are elevated or symptoms persist.

## 2023-07-16 NOTE — Assessment & Plan Note (Addendum)
Last A1c 5.8. Stable with diet control. Suspect improvement with weight loss after being on Wegovy. Will check A1c at next appointment. Encouraged healthy diet and exercise.

## 2023-07-18 ENCOUNTER — Other Ambulatory Visit (HOSPITAL_COMMUNITY): Payer: Self-pay

## 2023-07-18 ENCOUNTER — Telehealth: Payer: Self-pay

## 2023-07-18 NOTE — Telephone Encounter (Signed)
Pharmacy Patient Advocate Encounter   Received notification from Pt Calls Messages that prior authorization for Zepbound 2.5mg /0.42ml is required/requested.   Insurance verification completed.   The patient is insured through  [place]  .   Per test claim: PA required; PA submitted to above mentioned insurance via Prompt PA Key/confirmation #/EOC [id number] Status is pending

## 2023-07-18 NOTE — Telephone Encounter (Signed)
PA request has been Submitted. New Encounter created for follow up. For additional info see Pharmacy Prior Auth telephone encounter from 07/18/2023.

## 2023-07-18 NOTE — Telephone Encounter (Signed)
Pharmacy Patient Advocate Encounter  Received notification from  [facility]  that Prior Authorization for Zepbound 2.5mg /0.74ml has been APPROVED from 07/18/2023 to 01/15/2024. Ran test claim, Copay is $25. This test claim was processed through [facility]- copay amounts may vary at other pharmacies due to pharmacy/plan contracts, or as the patient moves through the different stages of their insurance plan.   PA #/Case ID/Reference #: [id number]

## 2023-08-07 ENCOUNTER — Other Ambulatory Visit: Payer: Self-pay | Admitting: Nurse Practitioner

## 2023-08-07 ENCOUNTER — Encounter: Payer: Self-pay | Admitting: Nurse Practitioner

## 2023-08-07 ENCOUNTER — Ambulatory Visit (INDEPENDENT_AMBULATORY_CARE_PROVIDER_SITE_OTHER): Admitting: Nurse Practitioner

## 2023-08-07 VITALS — BP 118/80 | HR 71 | Temp 97.9°F | Ht 64.0 in | Wt 179.6 lb

## 2023-08-07 DIAGNOSIS — D72820 Lymphocytosis (symptomatic): Secondary | ICD-10-CM | POA: Diagnosis not present

## 2023-08-07 DIAGNOSIS — R7303 Prediabetes: Secondary | ICD-10-CM | POA: Diagnosis not present

## 2023-08-07 DIAGNOSIS — R700 Elevated erythrocyte sedimentation rate: Secondary | ICD-10-CM

## 2023-08-07 DIAGNOSIS — K219 Gastro-esophageal reflux disease without esophagitis: Secondary | ICD-10-CM

## 2023-08-07 DIAGNOSIS — Z0001 Encounter for general adult medical examination with abnormal findings: Secondary | ICD-10-CM | POA: Diagnosis not present

## 2023-08-07 DIAGNOSIS — F419 Anxiety disorder, unspecified: Secondary | ICD-10-CM

## 2023-08-07 DIAGNOSIS — E785 Hyperlipidemia, unspecified: Secondary | ICD-10-CM

## 2023-08-07 DIAGNOSIS — R051 Acute cough: Secondary | ICD-10-CM | POA: Insufficient documentation

## 2023-08-07 DIAGNOSIS — Z1329 Encounter for screening for other suspected endocrine disorder: Secondary | ICD-10-CM

## 2023-08-07 LAB — COMPREHENSIVE METABOLIC PANEL
ALT: 21 U/L (ref 0–35)
AST: 18 U/L (ref 0–37)
Albumin: 4 g/dL (ref 3.5–5.2)
Alkaline Phosphatase: 104 U/L (ref 39–117)
BUN: 7 mg/dL (ref 6–23)
CO2: 29 meq/L (ref 19–32)
Calcium: 8.7 mg/dL (ref 8.4–10.5)
Chloride: 104 meq/L (ref 96–112)
Creatinine, Ser: 0.57 mg/dL (ref 0.40–1.20)
GFR: 116.54 mL/min (ref >60.00)
Glucose, Bld: 84 mg/dL (ref 70–99)
Potassium: 4.6 meq/L (ref 3.5–5.1)
Sodium: 139 meq/L (ref 135–145)
Total Bilirubin: 0.5 mg/dL (ref 0.2–1.2)
Total Protein: 7 g/dL (ref 6.0–8.3)

## 2023-08-07 LAB — CBC WITH DIFFERENTIAL/PLATELET
Basophils Absolute: 0 10*3/uL (ref 0.0–0.1)
Basophils Relative: 0.6 % (ref 0.0–3.0)
Eosinophils Absolute: 0.1 10*3/uL (ref 0.0–0.7)
Eosinophils Relative: 1.4 % (ref 0.0–5.0)
HCT: 41.9 % (ref 36.0–46.0)
Hemoglobin: 13.9 g/dL (ref 12.0–15.0)
Lymphocytes Relative: 45.2 % (ref 12.0–46.0)
Lymphs Abs: 3.5 10*3/uL (ref 0.7–4.0)
MCHC: 33.1 g/dL (ref 30.0–36.0)
MCV: 87.5 fl (ref 78.0–100.0)
Monocytes Absolute: 0.3 10*3/uL (ref 0.1–1.0)
Monocytes Relative: 3.4 % (ref 3.0–12.0)
Neutro Abs: 3.8 10*3/uL (ref 1.4–7.7)
Neutrophils Relative %: 49.4 % (ref 43.0–77.0)
Platelets: 321 10*3/uL (ref 150.0–400.0)
RBC: 4.78 Mil/uL (ref 3.87–5.11)
RDW: 13.4 % (ref 11.5–15.5)
WBC: 7.7 10*3/uL (ref 4.0–10.5)

## 2023-08-07 LAB — LIPID PANEL
Cholesterol: 207 mg/dL — ABNORMAL HIGH (ref 0–200)
HDL: 47.4 mg/dL (ref >39.00)
LDL Cholesterol: 123 mg/dL — ABNORMAL HIGH (ref 0–99)
NonHDL: 159.52
Total CHOL/HDL Ratio: 4
Triglycerides: 185 mg/dL — ABNORMAL HIGH (ref 0.0–149.0)
VLDL: 37 mg/dL (ref 0.0–40.0)

## 2023-08-07 LAB — TSH: TSH: 0.57 u[IU]/mL (ref 0.35–5.50)

## 2023-08-07 LAB — VITAMIN D 25 HYDROXY (VIT D DEFICIENCY, FRACTURES): VITD: 72.17 ng/mL (ref 30.00–100.00)

## 2023-08-07 LAB — HEMOGLOBIN A1C: Hgb A1c MFr Bld: 5.5 % (ref 4.6–6.5)

## 2023-08-07 MED ORDER — PSEUDOEPH-BROMPHEN-DM 30-2-10 MG/5ML PO SYRP
5.0000 mL | ORAL_SOLUTION | Freq: Four times a day (QID) | ORAL | 0 refills | Status: DC | PRN
Start: 2023-08-07 — End: 2023-09-03

## 2023-08-07 NOTE — Progress Notes (Signed)
 Bethanie Dicker, NP-C Phone: [phone]  Rebecca Evans is a 38 y.o. female who presents today for annual exam.   Discussed the use of AI scribe software for clinical note transcription with the patient, who gave verbal consent to proceed.  History of Present Illness   Rebecca Evans is a 38 year old female who presents with lingering symptoms following a recent flu infection and for annual exam.  She experienced the flu starting on Monday of last week, which has left her with lingering symptoms. She describes persistent fatigue and a sensation of mucus in her chest, accompanied by a cough that produces mucus. No shortness of breath is noted. Her throat was very sore last week, feeling as if it was 'sliced open,' though it has improved significantly. She experienced fevers last week during the flu but has not had any since.  She is currently taking Robitussin for cough and chest congestion but has not tried Mucinex. Her sleep has been disrupted due to coughing at night.  Her past medical history includes a previous issue with elevated yeast antibodies two years ago, for which is believed to have been causing digestive and inflammation issues. She is wanting to recheck these levels today. She was previously treated with oral nystatin for a month and a gluten and dairy free diet, which did not resolve her symptoms. She is currently taking Lexapro and has been taken off Crestor by her cardiologist, who deemed her cholesterol levels not severe enough to warrant medication. She has not noticed any weight loss since starting Zepbound three weeks ago.  No family history of breast, ovarian, or colon cancer. Socially, she does not smoke, consume alcohol, or use drugs. She maintains a diet where she tries to avoid 'random crap' and limits soda intake to one or two a day, with coffee being her main indulgence. She exercises for about thirty minutes daily, primarily walking. She had a hysterectomy  in 2018. She received a flu vaccine this year and her tetanus shot is up to date.      Social History   Tobacco Use  Smoking Status Never  Smokeless Tobacco Never    Current Outpatient Medications on File Prior to Visit  Medication Sig Dispense Refill   cetirizine (ZYRTEC) 10 MG tablet Take 10 mg by mouth daily as needed for allergies.     estradiol (VIVELLE-DOT) 0.1 MG/24HR patch UNWRAP AND APPLY 1 PATCH TO SKIN TWICE A WEEK 8 patch 10   rosuvastatin (CRESTOR) 10 MG tablet Take 1 tablet (10 mg total) by mouth daily. 90 tablet 3   tirzepatide (ZEPBOUND) 2.5 MG/0.5ML Pen Inject 2.5 mg into the skin once a week. 2 mL 0   tirzepatide (ZEPBOUND) 5 MG/0.5ML Pen Inject 5 mg into the skin once a week. (Patient not taking: Reported on 08/07/2023) 2 mL 0   No current facility-administered medications on file prior to visit.    ROS see history of present illness  Objective  Physical Exam Vitals:   08/07/23 0834  BP: 118/80  Pulse: 71  Temp: 97.9 F (36.6 C)  SpO2: 97%    BP Readings from Last 3 Encounters:  08/07/23 118/80  07/16/23 110/80  08/30/22 123/85   Wt Readings from Last 3 Encounters:  08/07/23 179 lb 9.6 oz (81.5 kg)  07/16/23 176 lb 12.8 oz (80.2 kg)  08/30/22 209 lb (94.8 kg)    Physical Exam Constitutional:      General: She is not in acute distress.  Appearance: Normal appearance.  HENT:     Head: Normocephalic.     Right Ear: Tympanic membrane normal.     Left Ear: Tympanic membrane normal.     Nose: Nose normal.     Mouth/Throat:     Mouth: Mucous membranes are moist.     Pharynx: Oropharynx is clear. Posterior oropharyngeal erythema present.  Eyes:     Conjunctiva/sclera: Conjunctivae normal.     Pupils: Pupils are equal, round, and reactive to light.  Neck:     Thyroid: No thyromegaly.  Cardiovascular:     Rate and Rhythm: Normal rate and regular rhythm.     Heart sounds: Normal heart sounds.  Pulmonary:     Effort: Pulmonary effort is  normal.     Breath sounds: Normal breath sounds.  Abdominal:     General: Abdomen is flat. Bowel sounds are normal.     Palpations: Abdomen is soft. There is no mass.     Tenderness: There is no abdominal tenderness.  Musculoskeletal:        General: Normal range of motion.  Lymphadenopathy:     Cervical: No cervical adenopathy.  Skin:    General: Skin is warm and dry.     Findings: No rash.  Neurological:     General: No focal deficit present.     Mental Status: She is alert.  Psychiatric:        Mood and Affect: Mood normal.        Behavior: Behavior normal.     Assessment/Plan: Please see individual problem list.  Encounter for routine adult medical exam with abnormal findings Assessment & Plan: Physical exam complete. Lab work as outlined. Pap smear no longer indicated due to hysterectomy. She is up to date on vaccinations and abstains from smoking, alcohol, and drugs. She maintains regular dental and eye care and makes efforts to maintain a healthy diet and exercise. Continue current lifestyle modifications, including diet and exercise. Monitor weight and dietary habits. Encourage continued regular dental and eye care visits. Return to care in 3 months, sooner as needed.  Orders: -     Comprehensive metabolic panel -     VITAMIN D 25 Hydroxy (Vit-D Deficiency, Fractures)  Acute cough Assessment & Plan: She experiences a lingering cough and chest congestion following influenza, likely due to post-viral irritation, with no active infection. Start to Mucinex for daytime congestion. Prescribe Bromfed DM for cough, 5-10 mL every 6 hours as needed, advised 10 mL at bedtime. Counseled on common side effects such as drowsiness. Encourage adequate hydration. Return precautions given to patient.   Orders: -     Pseudoeph-Bromphen-DM; Take 5-10 mLs by mouth every 6 (six) hours as needed.  Dispense: 120 mL; Refill: 0  Elevated sed rate Assessment & Plan: Elevated sedimentation rate  levels suggest inflammation, possibly gastrointestinal, and she is under rheumatology care without a definitive diagnosis. Order sed rate and candida antibody tests. Follow up with rheumatology as scheduled.   Orders: -     Sedimentation rate -     Candida Antibodies IgG,IgA,IgM  Hyperlipidemia, unspecified hyperlipidemia type -     Lipid panel  Lymphocytosis -     CBC with Differential/Platelet  Prediabetes -     Hemoglobin A1c  Thyroid disorder screen -     TSH   Return in about 3 months (around 11/07/2023) for Follow up.   Bethanie Dicker, NP-C [facility]

## 2023-08-07 NOTE — Assessment & Plan Note (Signed)
 Elevated sedimentation rate levels suggest inflammation, possibly gastrointestinal, and she is under rheumatology care without a definitive diagnosis. Order sed rate and candida antibody tests. Follow up with rheumatology as scheduled.

## 2023-08-07 NOTE — Assessment & Plan Note (Signed)
 Physical exam complete. Lab work as outlined. Pap smear no longer indicated due to hysterectomy. She is up to date on vaccinations and abstains from smoking, alcohol, and drugs. She maintains regular dental and eye care and makes efforts to maintain a healthy diet and exercise. Continue current lifestyle modifications, including diet and exercise. Monitor weight and dietary habits. Encourage continued regular dental and eye care visits. Return to care in 3 months, sooner as needed.

## 2023-08-07 NOTE — Assessment & Plan Note (Signed)
 She experiences a lingering cough and chest congestion following influenza, likely due to post-viral irritation, with no active infection. Start to Mucinex for daytime congestion. Prescribe Bromfed DM for cough, 5-10 mL every 6 hours as needed, advised 10 mL at bedtime. Counseled on common side effects such as drowsiness. Encourage adequate hydration. Return precautions given to patient.

## 2023-08-08 ENCOUNTER — Encounter: Payer: Self-pay | Admitting: Nurse Practitioner

## 2023-08-08 LAB — CANDIDA ANTIBODIES IGG,IGA,IGM
Candida Antibodies IgA: NEGATIVE
Candida Antibodies IgG: POSITIVE — AB
Candida Antibodies IgM: NEGATIVE

## 2023-08-08 LAB — SEDIMENTATION RATE: Sed Rate: 68 mm/h — ABNORMAL HIGH (ref 0–20)

## 2023-08-09 ENCOUNTER — Ambulatory Visit: Payer: BC Managed Care – PPO | Admitting: Nurse Practitioner

## 2023-08-10 ENCOUNTER — Other Ambulatory Visit: Payer: Self-pay | Admitting: Obstetrics and Gynecology

## 2023-08-10 DIAGNOSIS — E8940 Asymptomatic postprocedural ovarian failure: Secondary | ICD-10-CM

## 2023-08-12 DIAGNOSIS — E8940 Asymptomatic postprocedural ovarian failure: Secondary | ICD-10-CM

## 2023-08-14 MED ORDER — ESTRADIOL 0.1 MG/24HR TD PTTW: 1.0000 | MEDICATED_PATCH | TRANSDERMAL | 0 refills | Status: DC
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
## 2023-09-02 NOTE — Patient Instructions (Signed)
 Preventive Care 55-37 Years Old, Female Preventive care refers to lifestyle choices and visits with your health care provider that can promote health and wellness. Preventive care visits are also called wellness exams. What can I expect for my preventive care visit? Counseling During your preventive care visit, your health care provider may ask about your: Medical history, including: Past medical problems. Family medical history. Pregnancy history. Current health, including: Menstrual cycle. Method of birth control. Emotional well-being. Home life and relationship well-being. Sexual activity and sexual health. Lifestyle, including: Alcohol, nicotine or tobacco, and drug use. Access to firearms. Diet, exercise, and sleep habits. Work and work Astronomer. Sunscreen use. Safety issues such as seatbelt and bike helmet use. Physical exam Your health care provider may check your: Height and weight. These may be used to calculate your BMI (body mass index). BMI is a measurement that tells if you are at a healthy weight. Waist circumference. This measures the distance around your waistline. This measurement also tells if you are at a healthy weight and may help predict your risk of certain diseases, such as type 2 diabetes and high blood pressure. Heart rate and blood pressure. Body temperature. Skin for abnormal spots. What immunizations do I need?  Vaccines are usually given at various ages, according to a schedule. Your health care provider will recommend vaccines for you based on your age, medical history, and lifestyle or other factors, such as travel or where you work. What tests do I need? Screening Your health care provider may recommend screening tests for certain conditions. This may include: Pelvic exam and Pap test. Lipid and cholesterol levels. Diabetes screening. This is done by checking your blood sugar (glucose) after you have not eaten for a while (fasting). Hepatitis  B test. Hepatitis C test. HIV (human immunodeficiency virus) test. STI (sexually transmitted infection) testing, if you are at risk. BRCA-related cancer screening. This may be done if you have a family history of breast, ovarian, tubal, or peritoneal cancers. Talk with your health care provider about your test results, treatment options, and if necessary, the need for more tests. Follow these instructions at home: Eating and drinking  Eat a healthy diet that includes fresh fruits and vegetables, whole grains, lean protein, and low-fat dairy products. Take vitamin and mineral supplements as recommended by your health care provider. Do not drink alcohol if: Your health care provider tells you not to drink. You are pregnant, may be pregnant, or are planning to become pregnant. If you drink alcohol: Limit how much you have to 0-1 drink a day. Know how much alcohol is in your drink. In the [place] one drink equals one 12 oz bottle of beer (355 mL), one 5 oz glass of wine (148 mL), or one 1 oz glass of hard liquor (44 mL). Lifestyle Brush your teeth every morning and night with fluoride toothpaste. Floss one time each day. Exercise for at least 30 minutes 5 or more days each week. Do not use any products that contain nicotine or tobacco. These products include cigarettes, chewing tobacco, and vaping devices, such as e-cigarettes. If you need help quitting, ask your health care provider. Do not use drugs. If you are sexually active, practice safe sex. Use a condom or other form of protection to prevent STIs. If you do not wish to become pregnant, use a form of birth control. If you plan to become pregnant, see your health care provider for a prepregnancy visit. Find healthy ways to manage stress, such as: Meditation,  yoga, or listening to music. Journaling. Talking to a trusted person. Spending time with friends and family. Minimize exposure to UV radiation to reduce your risk of skin  cancer. Safety Always wear your seat belt while driving or riding in a vehicle. Do not drive: If you have been drinking alcohol. Do not ride with someone who has been drinking. If you have been using any mind-altering substances or drugs. While texting. When you are tired or distracted. Wear a helmet and other protective equipment during sports activities. If you have firearms in your house, make sure you follow all gun safety procedures. Seek help if you have been physically or sexually abused. What's next? Go to your health care provider once a year for an annual wellness visit. Ask your health care provider how often you should have your eyes and teeth checked. Stay up to date on all vaccines. This information is not intended to replace advice given to you by your health care provider. Make sure you discuss any questions you have with your health care provider. Document Revised: 11/16/2020 Document Reviewed: 11/16/2020 Elsevier Patient Education  2024 Elsevier Inc. Breast Self-Awareness Breast self-awareness is knowing how your breasts look and feel. You need to: Check your breasts on a regular basis. Tell your doctor about any changes. Become familiar with the look and feel of your breasts. This can help you catch a breast problem while it is still small and can be treated. You should do breast self-exams even if you have breast implants. What you need: A mirror. A well-lit room. A pillow or other soft object. How to do a breast self-exam Follow these steps to do a breast self-exam: Look for changes  Take off all the clothes above your waist. Stand in front of a mirror in a room with good lighting. Put your hands down at your sides. Compare your breasts in the mirror. Look for any difference between them, such as: A difference in shape. A difference in size. Wrinkles, dips, and bumps in one breast and not the other. Look at each breast for changes in the skin, such  as: Redness. Scaly areas. Skin that has gotten thicker. Dimpling. Open sores (ulcers). Look for changes in your nipples, such as: Fluid coming out of a nipple. Fluid around a nipple. Bleeding. Dimpling. Redness. A nipple that looks pushed in (retracted), or that has changed position. Feel for changes Lie on your back. Feel each breast. To do this: Pick a breast to feel. Place a pillow under the shoulder closest to that breast. Put the arm closest to that breast behind your head. Feel the nipple area of that breast using the hand of your other arm. Feel the area with the pads of your three middle fingers by making small circles with your fingers. Use light, medium, and firm pressure. Continue the overlapping circles, moving downward over the breast. Keep making circles with your fingers. Stop when you feel your ribs. Start making circles with your fingers again, this time going upward until you reach your collarbone. Then, make circles outward across your breast and into your armpit area. Squeeze your nipple. Check for discharge and lumps. Repeat these steps to check your other breast. Sit or stand in the tub or shower. With soapy water on your skin, feel each breast the same way you did when you were lying down. Write down what you find Writing down what you find can help you remember what to tell your doctor. Write down: What is  normal for each breast. Any changes you find in each breast. These include: The kind of changes you find. A tender or painful breast. Any lump you find. Write down its size and where it is. When you last had your monthly period (menstrual cycle). General tips If you are breastfeeding, the best time to check your breasts is after you feed your baby or after you use a breast pump. If you get monthly bleeding, the best time to check your breasts is 5-7 days after your monthly cycle ends. With time, you will become comfortable with the self-exam. You will  also start to know if there are changes in your breasts. Contact a doctor if: You see a change in the shape or size of your breasts or nipples. You see a change in the skin of your breast or nipples, such as red or scaly skin. You have fluid coming from your nipples that is not normal. You find a new lump or thick area. You have breast pain. You have any concerns about your breast health. Summary Breast self-awareness includes looking for changes in your breasts and feeling for changes within your breasts. You should do breast self-awareness in front of a mirror in a well-lit room. If you get monthly periods (menstrual cycles), the best time to check your breasts is 5-7 days after your period ends. Tell your doctor about any changes you see in your breasts. Changes include changes in size, changes on the skin, painful or tender breasts, or fluid from your nipples that is not normal. This information is not intended to replace advice given to you by your health care provider. Make sure you discuss any questions you have with your health care provider. Document Revised: 10/26/2021 Document Reviewed: 03/23/2021 Elsevier Patient Education  2024 [employer].

## 2023-09-03 ENCOUNTER — Ambulatory Visit (INDEPENDENT_AMBULATORY_CARE_PROVIDER_SITE_OTHER): Admitting: Obstetrics and Gynecology

## 2023-09-03 ENCOUNTER — Encounter: Payer: Self-pay | Admitting: Obstetrics and Gynecology

## 2023-09-03 VITALS — BP 110/81 | HR 89 | Resp 16 | Ht 64.0 in | Wt 180.7 lb

## 2023-09-03 DIAGNOSIS — Z01419 Encounter for gynecological examination (general) (routine) without abnormal findings: Secondary | ICD-10-CM

## 2023-09-03 DIAGNOSIS — E8940 Asymptomatic postprocedural ovarian failure: Secondary | ICD-10-CM

## 2023-09-03 DIAGNOSIS — E66811 Obesity, class 1: Secondary | ICD-10-CM

## 2023-09-03 DIAGNOSIS — R7303 Prediabetes: Secondary | ICD-10-CM

## 2023-09-03 DIAGNOSIS — Z90710 Acquired absence of both cervix and uterus: Secondary | ICD-10-CM

## 2023-09-03 DIAGNOSIS — E785 Hyperlipidemia, unspecified: Secondary | ICD-10-CM

## 2023-09-03 MED ORDER — NORELGESTROMIN-ETH ESTRADIOL 150-35 MCG/24HR TD PTWK: 1.0000 | MEDICATED_PATCH | TRANSDERMAL | 3 refills | Status: DC

## 2023-09-08 ENCOUNTER — Encounter: Payer: Self-pay | Admitting: Nurse Practitioner

## 2023-09-10 ENCOUNTER — Telehealth: Payer: Self-pay

## 2023-09-10 ENCOUNTER — Telehealth (INDEPENDENT_AMBULATORY_CARE_PROVIDER_SITE_OTHER): Admitting: Nurse Practitioner

## 2023-09-10 VITALS — Ht 64.0 in | Wt 181.0 lb

## 2023-09-10 DIAGNOSIS — Z6831 Body mass index (BMI) 31.0-31.9, adult: Secondary | ICD-10-CM | POA: Diagnosis not present

## 2023-09-10 DIAGNOSIS — E669 Obesity, unspecified: Secondary | ICD-10-CM

## 2023-09-10 DIAGNOSIS — M79671 Pain in right foot: Secondary | ICD-10-CM | POA: Diagnosis not present

## 2023-09-10 DIAGNOSIS — M79672 Pain in left foot: Secondary | ICD-10-CM | POA: Diagnosis not present

## 2023-09-10 MED ORDER — WEGOVY 1 MG/0.5ML ~~LOC~~ SOAJ: 1.0000 mg | SUBCUTANEOUS | 0 refills | Status: DC

## 2023-09-10 MED ORDER — WEGOVY 0.5 MG/0.5ML ~~LOC~~ SOAJ: 0.5000 mg | SUBCUTANEOUS | 0 refills | Status: DC

## 2023-09-10 NOTE — Assessment & Plan Note (Addendum)
 Heel pain is likely due to high arches, limiting her ability to perform cardio. Recommended resistance training and low-impact exercises to avoid exacerbating the pain such as Pilates or walking. Advise against high-impact cardio exercises that may worsen foot pain. Consider referral to Podiatry if persisting or worsening.

## 2023-09-10 NOTE — Progress Notes (Signed)
 MyChart Video Visit    Virtual Visit via Video Note   This visit type was conducted because this format is felt to be most appropriate for this patient at this time. Physical exam was limited by quality of the video and audio technology used for the visit. CMA was able to get the patient set up on a video visit.  Patient location: Work. Patient and provider in visit Provider location: Office  I discussed the limitations of evaluation and management by telemedicine and the availability of in person appointments. The patient expressed understanding and agreed to proceed.  Visit Date: 09/10/2023  Today's healthcare provider: Bethanie Dicker, NP     Subjective:    Patient ID: Rebecca Evans, female    DOB: 05-17-1986, 38 y.o.   MRN: [id number]  Chief Complaint  Patient presents with   Medication Consultation    HPI  Discussed the use of AI scribe software for clinical note transcription with the patient, who gave verbal consent to proceed.  History of Present Illness   Rebecca Evans is a 38 year old female who presents with concerns about weight management and medication efficacy.  She is concerned about the lack of efficacy of Zepbound, which she has been using for the past two months at a dose of 5 mg weekly. Despite this regimen, she has not observed any weight loss and has gained weight, increasing from 176 pounds in March to 181 pounds currently. Previously, she was on Wegovy, which was effective, resulting in weight loss from approximately 220 pounds to 165 pounds. She discontinued Wegovy at the beginning of the year and subsequently gained weight, reaching 176 pounds by March. She prefers to return to [facility] due to its past effectiveness and is willing to restart at a lower dose to avoid potential side effects such as nausea and abdominal pain.  She experiences foot pain, particularly in her heels, which she attributes to extremely high arches. She uses high  arch supports in her shoes but finds that her foot pain limits her ability to engage in cardio exercises. She is exploring alternative forms of exercise that do not exacerbate her foot pain, such as resistance training and walking.      Past Medical History:  Diagnosis Date   Anxiety    COVID-19    06/2020, 04/23/21   GERD (gastroesophageal reflux disease)    History of kidney stones    h/o   Leukocytosis 08/2020   Lymphocytosis 08/2020   Vitamin D deficiency     Past Surgical History:  Procedure Laterality Date   DIAGNOSTIC LAPAROSCOPY     ESOPHAGOGASTRODUODENOSCOPY (EGD) WITH PROPOFOL N/A 06/19/2016   Procedure: ESOPHAGOGASTRODUODENOSCOPY (EGD) WITH PROPOFOL;  Surgeon: Midge Minium, MD;  Location: [facility];  Service: Endoscopy;  Laterality: N/A;   LAPAROSCOPIC VAGINAL HYSTERECTOMY WITH SALPINGO OOPHORECTOMY Bilateral 05/13/2017   Procedure: LAPAROSCOPIC ASSISTED VAGINAL HYSTERECTOMY WITH BILATERAL SALPINGO OOPHORECTOMY;  Surgeon: Herold Harms, MD;  Location: [facility] ORS;  Service: Gynecology;  Laterality: Bilateral;   REMOVAL OF DRUG DELIVERY IMPLANT Left 05/13/2017   Procedure: REMOVAL OF DRUG DELIVERY IMPLANT, LEFT ARM;  Surgeon: Herold Harms, MD;  Location: [facility] ORS;  Service: Gynecology;  Laterality: Left;   TONSILLECTOMY      Family History  Problem Relation Age of Onset   Diabetes Maternal Grandmother    Diabetes Maternal Grandfather    Diabetes Mother        type 2   Other Mother  abnormal mammogram 07/2020    Diabetes Sister        type 2    Asthma Daughter    Diabetes Maternal Aunt        type 2   Asthma Daughter    Diabetes Maternal Aunt        type 2    Social History   Socioeconomic History   Marital status: Married    Spouse name: Not on file   Number of children: Not on file   Years of education: Not on file   Highest education level: Some college, no degree  Occupational History   Not on file  Tobacco Use   Smoking  status: Never   Smokeless tobacco: Never  Vaping Use   Vaping status: Never Used  Substance and Sexual Activity   Alcohol use: No   Drug use: No   Sexual activity: Yes    Birth control/protection: None, Surgical  Other Topics Concern   Not on file  Social History Narrative   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/2021 in [place] and 2nd grade    Married    No guns    Wears seat belt    Safe in relationship    Social Drivers of Health   Financial Resource Strain: Low Risk  (09/10/2023)   Overall Financial Resource Strain (CARDIA)    Difficulty of Paying Living Expenses: Not hard at all  Food Insecurity: No Food Insecurity (09/10/2023)   Hunger Vital Sign    Worried About Running Out of Food in the Last Year: Never true    Ran Out of Food in the Last Year: Never true  Transportation Needs: No Transportation Needs (09/10/2023)   PRAPARE - Administrator, Civil Service (Medical): No    Lack of Transportation (Non-Medical): No  Physical Activity: Sufficiently Active (09/10/2023)   Exercise Vital Sign    Days of Exercise per Week: 5 days    Minutes of Exercise per Session: 30 min  Stress: No Stress Concern Present (09/10/2023)   [employer] of Occupational Health - Occupational Stress Questionnaire    Feeling of Stress : Not at all  Social Connections: Socially Integrated (09/10/2023)   Social Connection and Isolation Panel [NHANES]    Frequency of Communication with Friends and Family: More than three times a week    Frequency of Social Gatherings with Friends and Family: More than three times a week    Attends Religious Services: More than 4 times per year    Active Member of [employer] or Organizations: Yes    Attends Engineer, structural: More than 4 times per year    Marital Status: Married  Catering manager Violence: Not on file    Outpatient Medications Prior to Visit  Medication Sig Dispense Refill   cetirizine (ZYRTEC) 10 MG tablet Take 10 mg by mouth  daily as needed for allergies.     escitalopram (LEXAPRO) 10 MG tablet TAKE 1 TABLET(10 MG) BY MOUTH DAILY 90 tablet 3   lansoprazole (PREVACID) 30 MG capsule TAKE 1 CAPSULE BY MOUTH AT BEDTIME 30 MINS BEFORE FOOD 90 capsule 3   norelgestromin-ethinyl estradiol (XULANE) 150-35 MCG/24HR transdermal patch Place 1 patch onto the skin once a week. 9 patch 3   tirzepatide (ZEPBOUND) 5 MG/0.5ML Pen Inject 5 mg into the skin once a week. 2 mL 0   No facility-administered medications prior to visit.    Allergies  Allergen Reactions   Liraglutide (  Weight Management) Other (See Comments)    Diarrhea with mucus   Saxenda [Liraglutide -Weight Management]     Diarrhea with mucus   Wellbutrin [Bupropion]     itching    ROS See HPI    Objective:    Physical Exam  Ht 5\' 4"  (1.626 m)   Wt 181 lb (82.1 kg)   LMP  (LMP Unknown)   BMI 31.07 kg/m  Wt Readings from Last 3 Encounters:  09/10/23 181 lb (82.1 kg)  09/03/23 180 lb 11.2 oz (82 kg)  08/07/23 179 lb 9.6 oz (81.5 kg)   GENERAL: alert, oriented, appears well and in no acute distress   HEENT: atraumatic, conjunttiva clear, no obvious abnormalities on inspection of external nose and ears   NECK: normal movements of the head and neck   LUNGS: on inspection no signs of respiratory distress, breathing rate appears normal, no obvious gross SOB, gasping or wheezing   CV: no obvious cyanosis   MS: moves all visible extremities without noticeable abnormality   PSYCH/NEURO: pleasant and cooperative, no obvious depression or anxiety, speech and thought processing grossly intact    Assessment & Plan:   Problem List Items Addressed This Visit     Obesity (BMI 30-39.9) - Primary   Her weight increased from 176 lbs to 181 lbs on Zepbound. She previously lost weight with [id number]. Discussed the risk of nausea with restarting Wegovy at 0.5 mg. Emphasized the importance of resistance/strength training and protein intake for maintaining muscle  mass. Switch to [facility] starting at 0.5 mg. Monitor for side effects such as nausea, vomiting, and severe abdominal pain. Encourage healthy diet and regular exercise. Schedule follow-up in 6 to 8 weeks for weight monitoring.       Relevant Medications   Semaglutide-Weight Management (WEGOVY) 0.5 MG/0.5ML SOAJ   Semaglutide-Weight Management (WEGOVY) 1 MG/0.5ML SOAJ   Heel pain, bilateral   Heel pain is likely due to high arches, limiting her ability to perform cardio. Recommended resistance training and low-impact exercises to avoid exacerbating the pain such as Pilates or walking. Advise against high-impact cardio exercises that may worsen foot pain. Consider referral to Podiatry if persisting or worsening.       Other Visit Diagnoses       BMI 31.0-31.9,adult       Relevant Medications   Semaglutide-Weight Management (WEGOVY) 0.5 MG/0.5ML SOAJ   Semaglutide-Weight Management (WEGOVY) 1 MG/0.5ML SOAJ       I have discontinued [facility] Hayes's Zepbound. I am also having her start on [facility] and Wegovy. Additionally, I am having her maintain her cetirizine, lansoprazole, escitalopram, and norelgestromin-ethinyl estradiol.  Meds ordered this encounter  Medications   Semaglutide-Weight Management (WEGOVY) 0.5 MG/0.5ML SOAJ    Sig: Inject 0.5 mg into the skin once a week.    Dispense:  2 mL    Refill:  0    Supervising Provider:   Sherlene Shams [[id number]]   Semaglutide-Weight Management (WEGOVY) 1 MG/0.5ML SOAJ    Sig: Inject 1 mg into the skin once a week.    Dispense:  2 mL    Refill:  0    Supervising Provider:   Sherlene Shams [[id number]]    I discussed the assessment and treatment plan with the patient. The patient was provided an opportunity to ask questions and all were answered. The patient agreed with the plan and demonstrated an understanding of the instructions.   The patient was advised to call back or seek an in-person  evaluation if the symptoms worsen or if the condition  fails to improve as anticipated.   Bethanie Dicker, NP [facility] [phone] (phone) [phone] (fax)  [facility]

## 2023-09-10 NOTE — Assessment & Plan Note (Signed)
 Her weight increased from 176 lbs to 181 lbs on Zepbound. She previously lost weight with ZOXWRU. Discussed the risk of nausea with restarting Wegovy at 0.5 mg. Emphasized the importance of resistance/strength training and protein intake for maintaining muscle mass. Switch to [facility] starting at 0.5 mg. Monitor for side effects such as nausea, vomiting, and severe abdominal pain. Encourage healthy diet and regular exercise. Schedule follow-up in 6 to 8 weeks for weight monitoring.

## 2023-09-10 NOTE — Telephone Encounter (Signed)
 PA needed for [facility].

## 2023-09-11 ENCOUNTER — Telehealth: Payer: Self-pay | Admitting: Pharmacy Technician

## 2023-09-11 ENCOUNTER — Other Ambulatory Visit (HOSPITAL_COMMUNITY): Payer: Self-pay

## 2023-09-11 NOTE — Telephone Encounter (Signed)
 Pharmacy Patient Advocate Encounter   Received notification from Pt Calls Messages that prior authorization for [facility] 0.5MG  is required/requested.   Insurance verification completed.   The patient is insured through [facility] .   Per test claim: PA required; PA submitted to above mentioned insurance via Prompt PA Key/confirmation #/EOC [id number] Status is pending

## 2023-09-12 ENCOUNTER — Other Ambulatory Visit (HOSPITAL_COMMUNITY): Payer: Self-pay

## 2023-09-12 NOTE — Telephone Encounter (Signed)
 PA request has been Submitted. New Encounter has been or will be created for follow up. For additional info see Pharmacy Prior Auth telephone encounter from 09/11/2023.

## 2023-09-12 NOTE — Telephone Encounter (Signed)
 Pharmacy Patient Advocate Encounter  Received notification from [facility] that Prior Authorization for [facility] 0.5MG  has been APPROVED as of 09/12/23, APPROVAL letter will be uploaded to media tab. Ran test claim and pt's copay is $25.00   PA #/Case ID/Reference #: [id number]

## 2023-10-14 ENCOUNTER — Encounter: Payer: Self-pay | Admitting: Nurse Practitioner

## 2023-10-14 ENCOUNTER — Other Ambulatory Visit: Payer: Self-pay | Admitting: Nurse Practitioner

## 2023-10-14 MED ORDER — WEGOVY 1.7 MG/0.75ML ~~LOC~~ SOAJ: 1.7000 mg | SUBCUTANEOUS | 0 refills | Status: DC

## 2023-10-15 ENCOUNTER — Other Ambulatory Visit (HOSPITAL_COMMUNITY): Payer: Self-pay

## 2023-10-15 NOTE — Telephone Encounter (Signed)
 Rx was discontinued on 07/16/2023. Is it okay to refuse request?

## 2023-10-21 ENCOUNTER — Other Ambulatory Visit (HOSPITAL_COMMUNITY): Payer: Self-pay

## 2023-10-22 ENCOUNTER — Telehealth: Admitting: Nurse Practitioner

## 2023-10-22 ENCOUNTER — Encounter (INDEPENDENT_AMBULATORY_CARE_PROVIDER_SITE_OTHER): Payer: Self-pay

## 2023-10-22 VITALS — Wt 176.0 lb

## 2023-10-22 DIAGNOSIS — E6609 Other obesity due to excess calories: Secondary | ICD-10-CM

## 2023-10-22 DIAGNOSIS — Z6830 Body mass index (BMI) 30.0-30.9, adult: Secondary | ICD-10-CM

## 2023-10-22 DIAGNOSIS — E66811 Obesity, class 1: Secondary | ICD-10-CM | POA: Diagnosis not present

## 2023-10-22 MED ORDER — WEGOVY 2.4 MG/0.75ML ~~LOC~~ SOAJ: 2.4000 mg | SUBCUTANEOUS | 2 refills | Status: DC

## 2023-10-22 NOTE — Progress Notes (Signed)
 MyChart Video Visit    Virtual Visit via Video Note   This visit type was conducted because this format is felt to be most appropriate for this patient at this time. Physical exam was limited by quality of the video and audio technology used for the visit. CMA was able to get the patient set up on a video visit.  Patient location: Home. Patient and provider in visit Provider location: Office  I discussed the limitations of evaluation and management by telemedicine and the availability of in person appointments. The patient expressed understanding and agreed to proceed.  Visit Date: 10/22/2023  Today's healthcare provider: Bluford Burkitt, NP     Subjective:    Patient ID: Rebecca Evans, female    DOB: 03/16/1986, 38 y.o.   MRN: [id number]  Chief Complaint  Patient presents with   Medical Management of Chronic Issues    6 wk f/u  Started the wegovy      HPI  Discussed the use of AI scribe software for clinical note transcription with the patient, who gave verbal consent to proceed.  History of Present Illness   Rebecca Evans is a 38 year old female who presents for a six-week follow-up after restarting Wegovy  for weight management.  She restarted Wegovy  two months ago, initially on a 1.0 mg dose for the first month due to a prescription error. She is currently on 1.7 mg weekly and is in the second week of this dosage. She has experienced a weight loss of approximately five to six pounds since starting the medication.  No adverse symptoms such as nausea, vomiting, or abdominal pain have been experienced since starting Wegovy . She notes a significant reduction in appetite, especially after increasing the dose to 1.7 mg, describing it as 'definitely feeling the appetite curb now'.  In terms of diet, she has increased her protein intake and is making efforts to exercise, although she finds it challenging due to her responsibilities with her children and other life  commitments. She is still eating but is careful not to overeat.  No nausea, vomiting, or abdominal pain.      Past Medical History:  Diagnosis Date   Anxiety    COVID-19    06/2020, 04/23/21   GERD (gastroesophageal reflux disease)    History of kidney stones    h/o   Leukocytosis 08/2020   Lymphocytosis 08/2020   Vitamin D  deficiency     Past Surgical History:  Procedure Laterality Date   DIAGNOSTIC LAPAROSCOPY     ESOPHAGOGASTRODUODENOSCOPY (EGD) WITH PROPOFOL  N/A 06/19/2016   Procedure: ESOPHAGOGASTRODUODENOSCOPY (EGD) WITH PROPOFOL ;  Surgeon: Marnee Sink, MD;  Location: [facility];  Service: Endoscopy;  Laterality: N/A;   LAPAROSCOPIC VAGINAL HYSTERECTOMY WITH SALPINGO OOPHORECTOMY Bilateral 05/13/2017   Procedure: LAPAROSCOPIC ASSISTED VAGINAL HYSTERECTOMY WITH BILATERAL SALPINGO OOPHORECTOMY;  Surgeon: Colan Dash, MD;  Location: [facility];  Service: Gynecology;  Laterality: Bilateral;   REMOVAL OF DRUG DELIVERY IMPLANT Left 05/13/2017   Procedure: REMOVAL OF DRUG DELIVERY IMPLANT, LEFT ARM;  Surgeon: Colan Dash, MD;  Location: [facility];  Service: Gynecology;  Laterality: Left;   TONSILLECTOMY      Family History  Problem Relation Age of Onset   Diabetes Maternal Grandmother    Diabetes Maternal Grandfather    Diabetes Mother        type 2   Other Mother        abnormal mammogram 07/2020    Diabetes Sister  type 2    Asthma Daughter    Diabetes Maternal Aunt        type 2   Asthma Daughter    Diabetes Maternal Aunt        type 2    Social History   Socioeconomic History   Marital status: Married    Spouse name: Not on file   Number of children: Not on file   Years of education: Not on file   Highest education level: Some college, no degree  Occupational History   Not on file  Tobacco Use   Smoking status: Never   Smokeless tobacco: Never  Vaping Use   Vaping status: Never Used  Substance and Sexual Activity   Alcohol  use: No   Drug use: No   Sexual activity: Yes    Birth control/protection: None, Surgical  Other Topics Concern   Not on file  Social History Narrative   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/2021 in [place] and 2nd grade    Married    No guns    Wears seat belt    Safe in relationship    Social Drivers of Health   Financial Resource Strain: Low Risk  (09/10/2023)   Overall Financial Resource Strain (CARDIA)    Difficulty of Paying Living Expenses: Not hard at all  Food Insecurity: No Food Insecurity (09/10/2023)   Hunger Vital Sign    Worried About Running Out of Food in the Last Year: Never true    Ran Out of Food in the Last Year: Never true  Transportation Needs: No Transportation Needs (09/10/2023)   PRAPARE - Administrator, Civil Service (Medical): No    Lack of Transportation (Non-Medical): No  Physical Activity: Sufficiently Active (09/10/2023)   Exercise Vital Sign    Days of Exercise per Week: 5 days    Minutes of Exercise per Session: 30 min  Stress: No Stress Concern Present (09/10/2023)   [employer] of Occupational Health - Occupational Stress Questionnaire    Feeling of Stress : Not at all  Social Connections: Socially Integrated (09/10/2023)   Social Connection and Isolation Panel [NHANES]    Frequency of Communication with Friends and Family: More than three times a week    Frequency of Social Gatherings with Friends and Family: More than three times a week    Attends Religious Services: More than 4 times per year    Active Member of [employer] or Organizations: Yes    Attends Engineer, structural: More than 4 times per year    Marital Status: Married  Catering manager Violence: Not on file    Outpatient Medications Prior to Visit  Medication Sig Dispense Refill   cetirizine (ZYRTEC) 10 MG tablet Take 10 mg by mouth daily as needed for allergies.     escitalopram  (LEXAPRO ) 10 MG tablet TAKE 1 TABLET(10 MG) BY MOUTH DAILY 90 tablet 3    lansoprazole  (PREVACID ) 30 MG capsule TAKE 1 CAPSULE BY MOUTH AT BEDTIME 30 MINS BEFORE FOOD 90 capsule 3   norelgestromin -ethinyl estradiol  (XULANE) 150-35 MCG/24HR transdermal patch Place 1 patch onto the skin once a week. 9 patch 3   Semaglutide -Weight Management (WEGOVY ) 1.7 MG/0.75ML SOAJ Inject 1.7 mg into the skin once a week. 3 mL 0   No facility-administered medications prior to visit.    Allergies  Allergen Reactions   Liraglutide  (Weight Management) Other (See Comments)    Diarrhea with mucus   Saxenda  [  Liraglutide  -Weight Management]     Diarrhea with mucus   Wellbutrin  [Bupropion ]     itching    ROS See HPI    Objective:     Physical Exam  Wt 176 lb (79.8 kg) Comment: Pt reported  LMP  (LMP Unknown)   BMI 30.21 kg/m  Wt Readings from Last 3 Encounters:  10/22/23 176 lb (79.8 kg)  09/10/23 181 lb (82.1 kg)  09/03/23 180 lb 11.2 oz (82 kg)   GENERAL: alert, oriented, appears well and in no acute distress   HEENT: atraumatic, conjunttiva clear, no obvious abnormalities on inspection of external nose and ears   NECK: normal movements of the head and neck   LUNGS: on inspection no signs of respiratory distress, breathing rate appears normal, no obvious gross SOB, gasping or wheezing   CV: no obvious cyanosis   MS: moves all visible extremities without noticeable abnormality   PSYCH/NEURO: pleasant and cooperative, no obvious depression or anxiety, speech and thought processing grossly intact    Assessment & Plan:   Problem List Items Addressed This Visit     Class 1 obesity due to excess calories with serious comorbidity and body mass index (BMI) of 30.0 to 30.9 in adult - Primary   She has responded positively to Wegovy , losing 5-6 pounds without adverse effects. The goal of 1-2 pounds weight loss per week aligns with her current progress. Continue Wegovy  at 1.7 mg for two weeks, then increase to 2.4 mg. Ensure refills for Wegovy  at 2.4 mg. Follow up in  six weeks to assess progress on 2.4 mg. Encourage dietary modifications with increased protein intake and regular physical activity.       Relevant Medications   Semaglutide -Weight Management (WEGOVY ) 2.4 MG/0.75ML SOAJ    I am having Rebecca Evans start on Wegovy . I am also having her maintain her cetirizine, lansoprazole , escitalopram , norelgestromin -ethinyl estradiol , and Wegovy .  Meds ordered this encounter  Medications   Semaglutide -Weight Management (WEGOVY ) 2.4 MG/0.75ML SOAJ    Sig: Inject 2.4 mg into the skin once a week.    Dispense:  3 mL    Refill:  2    Supervising Provider:   Thersia Flax [[id number]]    I discussed the assessment and treatment plan with the patient. The patient was provided an opportunity to ask questions and all were answered. The patient agreed with the plan and demonstrated an understanding of the instructions.   The patient was advised to call back or seek an in-person evaluation if the symptoms worsen or if the condition fails to improve as anticipated.   Bluford Burkitt, NP [facility] at [facility] [phone] (phone) [phone] (fax)  [facility]

## 2023-10-22 NOTE — Assessment & Plan Note (Signed)
 She has responded positively to Wegovy , losing 5-6 pounds without adverse effects. The goal of 1-2 pounds weight loss per week aligns with her current progress. Continue Wegovy  at 1.7 mg for two weeks, then increase to 2.4 mg. Ensure refills for Wegovy  at 2.4 mg. Follow up in six weeks to assess progress on 2.4 mg. Encourage dietary modifications with increased protein intake and regular physical activity.

## 2023-11-07 ENCOUNTER — Ambulatory Visit: Admitting: Nurse Practitioner

## 2023-12-11 ENCOUNTER — Telehealth: Admitting: Nurse Practitioner

## 2023-12-11 VITALS — Ht 64.0 in | Wt 174.0 lb

## 2023-12-11 DIAGNOSIS — E663 Overweight: Secondary | ICD-10-CM | POA: Diagnosis not present

## 2023-12-11 DIAGNOSIS — E66811 Body mass index (BMI) 30.0-30.9, adult: Secondary | ICD-10-CM

## 2023-12-11 DIAGNOSIS — Z7689 Persons encountering health services in other specified circumstances: Secondary | ICD-10-CM | POA: Insufficient documentation

## 2023-12-11 MED ORDER — WEGOVY 2.4 MG/0.75ML ~~LOC~~ SOAJ: 2.4000 mg | SUBCUTANEOUS | 5 refills | Status: DC

## 2023-12-11 NOTE — Progress Notes (Signed)
 MyChart Video Visit    Virtual Visit via Video Note   This visit type was conducted because this format is felt to be most appropriate for this patient at this time. Physical exam was limited by quality of the video and audio technology used for the visit. CMA was able to get the patient set up on a video visit.  Patient location: Work. Patient and provider in visit Provider location: Office  I discussed the limitations of evaluation and management by telemedicine and the availability of in person appointments. The patient expressed understanding and agreed to proceed.  Visit Date: 12/11/2023  Today's healthcare provider: Leron Glance, NP     Subjective:    Patient ID: Rebecca Evans, female    DOB: August 31, 1985, 38 y.o.   MRN: [id number]  Chief Complaint  Patient presents with   Follow-up    HPI  Discussed the use of AI scribe software for clinical note transcription with the patient, who gave verbal consent to proceed.  History of Present Illness   Rebecca Evans is a 38 year old female who presents for a six-week follow-up on Wegovy  treatment.  She has lost approximately two more pounds on Wegovy , although she notices more significant changes in her clothing fit, suggesting alterations in body composition. She experiences significant nausea for the first two to three days after her Wegovy  injection, which affects her appetite and makes it difficult to consume regular meals. She has been on the 2.4 mg dose for about one month, following a two-week gap without the medication. During the initial days post-injection, drinking water exacerbates her nausea.  She experiences swelling in her legs, which she associates with water retention. She recalls a previous episode a couple of years ago when the swelling was more severe. Drinking more water seems to exacerbate the swelling. She has developed a habit of drinking Gatorade, particularly after taking her Wegovy  shot,  as it helps alleviate nausea. She consumes about one full-sugar Gatorade per day, as she finds it more palatable than the zero-sugar version.  Her diet consists of a protein shake in the morning, often skipping lunch, and having dinner at home, which she cooks for her family. She mentions not eating out frequently and consuming a lot of fruit, although she is not fond of vegetables.  She is actively trying to increase her physical activity, focusing on learning to run. She is following a 'couch to 5K' program to gradually build her running endurance. She describes her running style humorously, indicating a positive attitude towards her fitness journey.      Past Medical History:  Diagnosis Date   Anxiety    COVID-19    06/2020, 04/23/21   GERD (gastroesophageal reflux disease)    History of kidney stones    h/o   Leukocytosis 08/2020   Lymphocytosis 08/2020   Vitamin D  deficiency     Past Surgical History:  Procedure Laterality Date   DIAGNOSTIC LAPAROSCOPY     ESOPHAGOGASTRODUODENOSCOPY (EGD) WITH PROPOFOL  N/A 06/19/2016   Procedure: ESOPHAGOGASTRODUODENOSCOPY (EGD) WITH PROPOFOL ;  Surgeon: Rogelia Copping, MD;  Location: [facility];  Service: Endoscopy;  Laterality: N/A;   LAPAROSCOPIC VAGINAL HYSTERECTOMY WITH SALPINGO OOPHORECTOMY Bilateral 05/13/2017   Procedure: LAPAROSCOPIC ASSISTED VAGINAL HYSTERECTOMY WITH BILATERAL SALPINGO OOPHORECTOMY;  Surgeon: Kathe Gladis LABOR, MD;  Location: [facility];  Service: Gynecology;  Laterality: Bilateral;   REMOVAL OF DRUG DELIVERY IMPLANT Left 05/13/2017   Procedure: REMOVAL OF DRUG DELIVERY IMPLANT, LEFT ARM;  Surgeon: Defrancesco,  Gladis LABOR, MD;  Location: [facility];  Service: Gynecology;  Laterality: Left;   TONSILLECTOMY      Family History  Problem Relation Age of Onset   Diabetes Maternal Grandmother    Diabetes Maternal Grandfather    Diabetes Mother        type 2   Other Mother        abnormal mammogram 07/2020    Diabetes  Sister        type 2    Asthma Daughter    Diabetes Maternal Aunt        type 2   Asthma Daughter    Diabetes Maternal Aunt        type 2    Social History   Socioeconomic History   Marital status: Married    Spouse name: Not on file   Number of children: Not on file   Years of education: Not on file   Highest education level: Some college, no degree  Occupational History   Not on file  Tobacco Use   Smoking status: Never   Smokeless tobacco: Never  Vaping Use   Vaping status: Never Used  Substance and Sexual Activity   Alcohol use: No   Drug use: No   Sexual activity: Yes    Birth control/protection: None, Surgical  Other Topics Concern   Not on file  Social History 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    Married    No guns    Wears seat belt    Safe in relationship    Social Drivers of Health   Financial Resource Strain: Low Risk  (09/10/2023)   Overall Financial Resource Strain (CARDIA)    Difficulty of Paying Living Expenses: Not hard at all  Food Insecurity: No Food Insecurity (09/10/2023)   Hunger Vital Sign    Worried About Running Out of Food in the Last Year: Never true    Ran Out of Food in the Last Year: Never true  Transportation Needs: No Transportation Needs (09/10/2023)   PRAPARE - Administrator, Civil Service (Medical): No    Lack of Transportation (Non-Medical): No  Physical Activity: Sufficiently Active (09/10/2023)   Exercise Vital Sign    Days of Exercise per Week: 5 days    Minutes of Exercise per Session: 30 min  Stress: No Stress Concern Present (09/10/2023)   [employer] of Occupational Health - Occupational Stress Questionnaire    Feeling of Stress : Not at all  Social Connections: Socially Integrated (09/10/2023)   Social Connection and Isolation Panel    Frequency of Communication with Friends and Family: More than three times a week    Frequency of Social Gatherings with  Friends and Family: More than three times a week    Attends Religious Services: More than 4 times per year    Active Member of [employer] or Organizations: Yes    Attends Engineer, structural: More than 4 times per year    Marital Status: Married  Catering manager Violence: Not on file    Outpatient Medications Prior to Visit  Medication Sig Dispense Refill   cetirizine (ZYRTEC) 10 MG tablet Take 10 mg by mouth daily as needed for allergies.     escitalopram  (LEXAPRO ) 10 MG tablet TAKE 1 TABLET(10 MG) BY MOUTH DAILY 90 tablet 3   lansoprazole  (PREVACID ) 30 MG capsule TAKE 1 CAPSULE BY MOUTH AT BEDTIME 30  MINS BEFORE FOOD 90 capsule 3   norelgestromin -ethinyl estradiol  (XULANE) 150-35 MCG/24HR transdermal patch Place 1 patch onto the skin once a week. 9 patch 3   Semaglutide -Weight Management (WEGOVY ) 1.7 MG/0.75ML SOAJ Inject 1.7 mg into the skin once a week. 3 mL 0   Semaglutide -Weight Management (WEGOVY ) 2.4 MG/0.75ML SOAJ Inject 2.4 mg into the skin once a week. 3 mL 2   No facility-administered medications prior to visit.    Allergies  Allergen Reactions   Liraglutide  (Weight Management) Other (See Comments)    Diarrhea with mucus   Saxenda  [Liraglutide  -Weight Management]     Diarrhea with mucus   Wellbutrin  [Bupropion ]     itching    ROS See HPI    Objective:    Physical Exam  Ht 5' 4 (1.626 m)   Wt 174 lb (78.9 kg)   LMP  (LMP Unknown)   BMI 29.87 kg/m  Wt Readings from Last 3 Encounters:  12/11/23 174 lb (78.9 kg)  10/22/23 176 lb (79.8 kg)  09/10/23 181 lb (82.1 kg)   GENERAL: alert, oriented, appears well and in no acute distress   HEENT: atraumatic, conjunttiva clear, no obvious abnormalities on inspection of external nose and ears   NECK: normal movements of the head and neck   LUNGS: on inspection no signs of respiratory distress, breathing rate appears normal, no obvious gross SOB, gasping or wheezing   CV: no obvious cyanosis   MS: moves  all visible extremities without noticeable abnormality   PSYCH/NEURO: pleasant and cooperative, no obvious depression or anxiety, speech and thought processing grossly intact    Assessment & Plan:   Problem List Items Addressed This Visit     Overweight (BMI 25.0-29.9)   On Wegovy  with weight loss and noted body composition changes. Nausea managed with Gatorade, but causing leg swelling due to sodium. Emphasize maintaining muscle mass and bone density. Continue Wegovy  2.4 mg with refills for three months. Monitor leg swelling and report if it worsens. Reduce Gatorade intake and increase water consumption. Continue exercise regimen, including running and cardio activities. Encourage healthy diet.       Relevant Medications   Semaglutide -Weight Management (WEGOVY ) 2.4 MG/0.75ML SOAJ   Encounter for weight management - Primary    I am having Rebecca Evans maintain her cetirizine, lansoprazole , escitalopram , norelgestromin -ethinyl estradiol , and Wegovy .  Meds ordered this encounter  Medications   Semaglutide -Weight Management (WEGOVY ) 2.4 MG/0.75ML SOAJ    Sig: Inject 2.4 mg into the skin once a week.    Dispense:  3 mL    Refill:  5    Supervising Provider:   MARYLYNN VERNEITA CROME [[id number]]    I discussed the assessment and treatment plan with the patient. The patient was provided an opportunity to ask questions and all were answered. The patient agreed with the plan and demonstrated an understanding of the instructions.   The patient was advised to call back or seek an in-person evaluation if the symptoms worsen or if the condition fails to improve as anticipated.   Leron Glance, NP [facility] at [facility] [phone] (phone) [phone] (fax)  [facility]

## 2023-12-16 NOTE — Assessment & Plan Note (Signed)
 On Wegovy  with weight loss and noted body composition changes. Nausea managed with Gatorade, but causing leg swelling due to sodium. Emphasize maintaining muscle mass and bone density. Continue Wegovy  2.4 mg with refills for three months. Monitor leg swelling and report if it worsens. Reduce Gatorade intake and increase water consumption. Continue exercise regimen, including running and cardio activities. Encourage healthy diet.

## 2024-03-17 ENCOUNTER — Encounter: Payer: Self-pay | Admitting: Nurse Practitioner

## 2024-03-20 ENCOUNTER — Other Ambulatory Visit: Payer: Self-pay | Admitting: Nurse Practitioner

## 2024-03-20 DIAGNOSIS — E66811 Obesity, class 1: Secondary | ICD-10-CM

## 2024-03-20 MED ORDER — WEGOVY 0.25 MG/0.5ML ~~LOC~~ SOAJ: 0.2500 mg | SUBCUTANEOUS | 1 refills | Status: AC

## 2024-03-27 NOTE — Telephone Encounter (Signed)
 PA needed for [facility].

## 2024-03-30 ENCOUNTER — Other Ambulatory Visit (HOSPITAL_COMMUNITY): Payer: Self-pay

## 2024-03-31 ENCOUNTER — Other Ambulatory Visit (HOSPITAL_COMMUNITY): Payer: Self-pay

## 2024-04-02 ENCOUNTER — Other Ambulatory Visit (HOSPITAL_COMMUNITY): Payer: Self-pay

## 2024-04-02 ENCOUNTER — Telehealth: Payer: Self-pay

## 2024-04-02 NOTE — Telephone Encounter (Signed)
 Pharmacy Patient Advocate Encounter   Received notification from Onbase that prior authorization for WEGOVY  0.25 MG/0.5 ML PEN is required/requested.   Insurance verification completed.   The patient is insured through [facility].   Per test claim: PA required; PA submitted to above mentioned insurance via Prompt PA Key/confirmation #/[id number] Status is pending

## 2024-04-03 NOTE — Telephone Encounter (Signed)
 Pharmacy Patient Advocate Encounter  Received notification from [employer] that Prior Authorization for WEGOVY  has been APPROVED from 04/02/24 to 04/01/25   PA #/Case ID/Reference #: [id number]

## 2024-04-03 NOTE — Telephone Encounter (Signed)
 Noted

## 2024-04-06 ENCOUNTER — Other Ambulatory Visit (HOSPITAL_COMMUNITY): Payer: Self-pay

## 2024-04-08 ENCOUNTER — Telehealth: Payer: Self-pay

## 2024-04-08 NOTE — Telephone Encounter (Signed)
 Called pt and got her scheduled with Vincente on 04-10-24

## 2024-04-08 NOTE — Telephone Encounter (Signed)
 Copied from CRM [id number]. Topic: Appointments - Scheduling Inquiry for Clinic >> Apr 08, 2024  3:21 PM [name] wrote: Reason for CRM: Patient wants to come in for an appointment for a possible sinus infection with Kacy only, patient refused to schedule with any other providers stating she only wants to see Leron and her next available is in Jan 2026.  Rebecca Evans [phone]

## 2024-04-10 ENCOUNTER — Ambulatory Visit: Admitting: Nurse Practitioner

## 2024-04-10 ENCOUNTER — Encounter: Payer: Self-pay | Admitting: Nurse Practitioner

## 2024-04-10 VITALS — BP 128/84 | HR 84 | Temp 98.1°F | Ht 64.0 in | Wt 196.0 lb

## 2024-04-10 DIAGNOSIS — J019 Acute sinusitis, unspecified: Secondary | ICD-10-CM | POA: Insufficient documentation

## 2024-04-10 DIAGNOSIS — B001 Herpesviral vesicular dermatitis: Secondary | ICD-10-CM | POA: Insufficient documentation

## 2024-04-10 MED ORDER — AMOXICILLIN-POT CLAVULANATE 875-125 MG PO TABS: 1.0000 | ORAL_TABLET | Freq: Two times a day (BID) | ORAL | 0 refills | Status: AC

## 2024-04-10 MED ORDER — FLUCONAZOLE 150 MG PO TABS: 150.0000 mg | ORAL_TABLET | Freq: Once | ORAL | 0 refills | Status: AC

## 2024-04-10 MED ORDER — ACYCLOVIR 5 % EX OINT: 1.0000 | TOPICAL_OINTMENT | Freq: Two times a day (BID) | CUTANEOUS | 0 refills | Status: AC

## 2024-04-10 NOTE — Assessment & Plan Note (Addendum)
 Recurrent outbreak, previous treatments ineffective. - Prescribed Zovirax cream.

## 2024-04-10 NOTE — Progress Notes (Signed)
 Established Patient Office Visit  Subjective:  Patient ID: Rebecca Evans, female    DOB: [birth year]  Age: 38 y.o. MRN: [id number]  CC:  Chief Complaint  Patient presents with   Cough    X 4 weeks, productive cough.  Sinus pressure and ear pain/pressure.     Discussed the use of AI scribe software for clinical note transcription with the patient, who gave verbal consent to proceed.  History of Present Illness Rebecca Evans is a 38 year old female who presents with symptoms suggestive of a sinus infection and cold sore.  She has had a head cold for four weeks with persistent sinus pressure, ear stopped up. There is no fever. She experiences a productive cough with mucus and significant postnasal drainage. The initial sore throat has resolved, but the cough remains irritating. Over-the-counter medications such as Sudafed, Mucinex , and Flonase have not provided relief. She takes Zyrtec daily but is unsure about having allergies. 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  A cold sore appeared on Tuesday of this week and have been using OTC lysine with no significant improvement.     Past Medical History:  Diagnosis Date   Anxiety    COVID-19    06/2020, 04/23/21   GERD (gastroesophageal reflux disease)    History of kidney stones    h/o   Leukocytosis 08/2020   Lymphocytosis 08/2020   Vitamin D  deficiency     Past Surgical History:  Procedure Laterality Date   DIAGNOSTIC LAPAROSCOPY     ESOPHAGOGASTRODUODENOSCOPY (EGD) WITH PROPOFOL  N/A 06/19/2016   Procedure: ESOPHAGOGASTRODUODENOSCOPY (EGD) WITH PROPOFOL ;  Surgeon: Rogelia Copping, MD;  Location: [facility];  Service: Endoscopy;  Laterality: N/A;   LAPAROSCOPIC VAGINAL HYSTERECTOMY WITH SALPINGO OOPHORECTOMY Bilateral 05/13/2017   Procedure: LAPAROSCOPIC ASSISTED VAGINAL HYSTERECTOMY WITH BILATERAL SALPINGO OOPHORECTOMY;  Surgeon: Kathe Gladis LABOR, MD;  Location: [facility];  Service:  Gynecology;  Laterality: Bilateral;   REMOVAL OF DRUG DELIVERY IMPLANT Left 05/13/2017   Procedure: REMOVAL OF DRUG DELIVERY IMPLANT, LEFT ARM;  Surgeon: Kathe Gladis LABOR, MD;  Location: [facility];  Service: Gynecology;  Laterality: Left;   TONSILLECTOMY      Family History  Problem Relation Age of Onset   Diabetes Maternal Grandmother    Diabetes Maternal Grandfather    Diabetes Mother        type 2   Other Mother        abnormal mammogram 07/2020    Diabetes Sister        type 2    Asthma Daughter    Diabetes Maternal Aunt        type 2   Asthma Daughter    Diabetes Maternal Aunt        type 2    Social History   Socioeconomic History   Marital status: Married    Spouse name: Not on file   Number of children: Not on file   Years of education: Not on file   Highest education level: Some college, no degree  Occupational History   Not on file  Tobacco Use   Smoking status: Never   Smokeless tobacco: Never  Vaping Use   Vaping status: Never Used  Substance and Sexual Activity   Alcohol use: No   Drug use: No   Sexual activity: Yes    Birth control/protection: None, Surgical  Other Topics Concern   Not on file  Social History Narrative   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 as of 07/2021 in [place] and 2nd grade    Married    No guns    Wears seat belt    Safe in relationship    Social Drivers of Health   Financial Resource Strain: Low Risk  (09/10/2023)   Overall Financial Resource Strain (CARDIA)    Difficulty of Paying Living Expenses: Not hard at all  Food Insecurity: No Food Insecurity (09/10/2023)   Hunger Vital Sign    Worried About Running Out of Food in the Last Year: Never true    Ran Out of Food in the Last Year: Never true  Transportation Needs: No Transportation Needs (09/10/2023)   PRAPARE - Administrator, Civil Service (Medical): No    Lack of Transportation (Non-Medical): No  Physical Activity: Sufficiently Active (09/10/2023)    Exercise Vital Sign    Days of Exercise per Week: 5 days    Minutes of Exercise per Session: 30 min  Stress: No Stress Concern Present (09/10/2023)   [employer] of Occupational Health - Occupational Stress Questionnaire    Feeling of Stress : Not at all  Social Connections: Socially Integrated (09/10/2023)   Social Connection and Isolation Panel    Frequency of Communication with Friends and Family: More than three times a week    Frequency of Social Gatherings with Friends and Family: More than three times a week    Attends Religious Services: More than 4 times per year    Active Member of [employer] or Organizations: Yes    Attends Engineer, Structural: More than 4 times per year    Marital Status: Married  Catering Manager Violence: Not on file     Outpatient Medications Prior to Visit  Medication Sig Dispense Refill   cetirizine (ZYRTEC) 10 MG tablet Take 10 mg by mouth daily as needed for allergies.     escitalopram  (LEXAPRO ) 10 MG tablet TAKE 1 TABLET(10 MG) BY MOUTH DAILY 90 tablet 3   lansoprazole  (PREVACID ) 30 MG capsule TAKE 1 CAPSULE BY MOUTH AT BEDTIME 30 MINS BEFORE FOOD 90 capsule 3   norelgestromin -ethinyl estradiol  (XULANE) 150-35 MCG/24HR transdermal patch Place 1 patch onto the skin once a week. 9 patch 3   semaglutide -weight management (WEGOVY ) 0.25 MG/0.5ML SOAJ SQ injection Inject 0.25 mg into the skin once a week. X 4 weeks then increase to 0.5 mg weekly. 2 mL 1   No facility-administered medications prior to visit.    Allergies  Allergen Reactions   Liraglutide  (Weight Management) Other (See Comments)    Diarrhea with mucus   Saxenda  [Liraglutide  -Weight Management]     Diarrhea with mucus   Wellbutrin  [Bupropion ]     itching    ROS Review of Systems Negative unless indicated in HPI.    Objective:    Physical Exam Constitutional:      Appearance: Normal appearance.  HENT:     Right Ear: Tympanic membrane normal. Tympanic membrane is not  erythematous.     Left Ear: Tympanic membrane normal. Tympanic membrane is not erythematous.     Nose:     Right Turbinates: Not enlarged.     Left Turbinates: Not enlarged.     Right Sinus: No maxillary sinus tenderness or frontal sinus tenderness.     Left Sinus: No maxillary sinus tenderness or frontal sinus tenderness.     Mouth/Throat:     Mouth: Mucous membranes are moist.     Pharynx: Postnasal drip present. No pharyngeal swelling, oropharyngeal  exudate or posterior oropharyngeal erythema.     Tonsils: No tonsillar exudate.      Comments: Rash on the left upper lip Cardiovascular:     Rate and Rhythm: Normal rate and regular rhythm.  Pulmonary:     Effort: Pulmonary effort is normal.     Breath sounds: Normal breath sounds. No stridor. No wheezing.  Neurological:     General: No focal deficit present.     Mental Status: She is alert and oriented to person, place, and time. Mental status is at baseline.  Psychiatric:        Mood and Affect: Mood normal.        Behavior: Behavior normal.        Thought Content: Thought content normal.        Judgment: Judgment normal.     BP 128/84   Pulse 84   Temp 98.1 F (36.7 C) (Oral)   Ht 5' 4 (1.626 m)   Wt 196 lb (88.9 kg)   LMP  (LMP Unknown)   BMI 33.64 kg/m  Wt Readings from Last 3 Encounters:  04/10/24 196 lb (88.9 kg)  12/11/23 174 lb (78.9 kg)  10/22/23 176 lb (79.8 kg)     Health Maintenance  Topic Date Due   Hepatitis B Vaccines 19-59 Average Risk (1 of 3 - 19+ 3-dose series) Never done   HPV VACCINES (1 - 3-dose SCDM series) Never done   COVID-19 Vaccine (4 - 2025-26 season) 02/03/2024   Influenza Vaccine  09/01/2024 (Originally 01/03/2024)   DTaP/Tdap/Td (2 - Td or Tdap) 02/08/2025   Hepatitis C Screening  Completed   HIV Screening  Completed   Pneumococcal Vaccine  Aged Out   Meningococcal B Vaccine  Aged Out       Topic Date Due   Hepatitis B Vaccines 19-59 Average Risk (1 of 3 - 19+ 3-dose series)  Never done   HPV VACCINES (1 - 3-dose SCDM series) Never done    Lab Results  Component Value Date   TSH 0.57 08/07/2023   Lab Results  Component Value Date   WBC 7.7 08/07/2023   HGB 13.9 08/07/2023   HCT 41.9 08/07/2023   MCV 87.5 08/07/2023   PLT 321.0 08/07/2023   Lab Results  Component Value Date   NA 139 08/07/2023   K 4.6 08/07/2023   CO2 29 08/07/2023   GLUCOSE 84 08/07/2023   BUN 7 08/07/2023   CREATININE 0.57 08/07/2023   BILITOT 0.5 08/07/2023   ALKPHOS 104 08/07/2023   AST 18 08/07/2023   ALT 21 08/07/2023   PROT 7.0 08/07/2023   ALBUMIN 4.0 08/07/2023   CALCIUM  8.7 08/07/2023   ANIONGAP 7 06/12/2016   EGFR 117 03/18/2022   GFR 116.54 08/07/2023   Lab Results  Component Value Date   CHOL 207 (H) 08/07/2023   Lab Results  Component Value Date   HDL 47.40 08/07/2023   Lab Results  Component Value Date   LDLCALC 123 (H) 08/07/2023   Lab Results  Component Value Date   TRIG 185.0 (H) 08/07/2023   Lab Results  Component Value Date   CHOLHDL 4 08/07/2023   Lab Results  Component Value Date   HGBA1C 5.5 08/07/2023      Assessment & Plan:   Assessment & Plan Acute sinusitis, recurrence not specified, unspecified location Symptoms persistent for four weeks, ineffective OTC treatment. She is requesting Diflucan  with antibiotic use.  - Prescribed Augmentin  for 7 days. - Continue Flonase nasal  spray. - Switch to plain Mucinex . - Advised steam baths and use of a steamer.     Cold sore Recurrent outbreak, previous treatments ineffective. - Prescribed Zovirax cream.      Assessment and Plan Assessment & Plan       Follow-up: No follow-ups on file.   Charene Mccallister, NP

## 2024-04-10 NOTE — Assessment & Plan Note (Addendum)
 Symptoms persistent for four weeks, ineffective OTC treatment. She is requesting Diflucan  with antibiotic use.  - Prescribed Augmentin  for 7 days. - Continue Flonase nasal spray. - Switch to plain Mucinex . - Advised steam baths and use of a steamer.

## 2024-05-13 ENCOUNTER — Telehealth: Payer: Self-pay

## 2024-05-13 MED ORDER — NORELGESTROMIN-ETH ESTRADIOL 150-35 MCG/24HR TD PTWK: 1.0000 | MEDICATED_PATCH | TRANSDERMAL | 3 refills | Status: AC

## 2024-05-13 NOTE — Telephone Encounter (Signed)
 Pt called triage needing refill on xulane patch , her follow up will be in April 2026. Refill sent.

## 2024-05-21 ENCOUNTER — Encounter: Payer: Self-pay | Admitting: Nurse Practitioner

## 2024-05-23 MED ORDER — WEGOVY 1 MG/0.5ML ~~LOC~~ SOAJ: 1.0000 mg | SUBCUTANEOUS | 2 refills | Status: DC

## 2024-06-23 ENCOUNTER — Encounter: Payer: Self-pay | Admitting: Nurse Practitioner

## 2024-06-23 ENCOUNTER — Other Ambulatory Visit: Payer: Self-pay | Admitting: Nurse Practitioner

## 2024-06-23 DIAGNOSIS — E66811 Obesity, class 1: Secondary | ICD-10-CM

## 2024-06-24 MED ORDER — WEGOVY 1.7 MG/0.75ML ~~LOC~~ SOAJ: 1.7000 mg | SUBCUTANEOUS | 2 refills | Status: AC

## 2024-06-27 ENCOUNTER — Other Ambulatory Visit: Payer: Self-pay | Admitting: Nurse Practitioner

## 2024-06-27 DIAGNOSIS — F419 Anxiety disorder, unspecified: Secondary | ICD-10-CM
